# Patient Record
Sex: Male | Born: 1960
Health system: Southern US, Community
[De-identification: ages and names within clinical notes are randomized; demographics above are authoritative.]

## PROBLEM LIST (undated history)

## (undated) DIAGNOSIS — N189 Chronic kidney disease, unspecified: Secondary | ICD-10-CM

## (undated) DIAGNOSIS — E785 Hyperlipidemia, unspecified: Secondary | ICD-10-CM

## (undated) DIAGNOSIS — I10 Essential (primary) hypertension: Secondary | ICD-10-CM

## (undated) DIAGNOSIS — Z8719 Personal history of other diseases of the digestive system: Secondary | ICD-10-CM

## (undated) DIAGNOSIS — C9210 Chronic myeloid leukemia, BCR/ABL-positive, not having achieved remission: Secondary | ICD-10-CM

## (undated) DIAGNOSIS — K649 Unspecified hemorrhoids: Secondary | ICD-10-CM

## (undated) DIAGNOSIS — G8929 Other chronic pain: Secondary | ICD-10-CM

## (undated) DIAGNOSIS — E119 Type 2 diabetes mellitus without complications: Secondary | ICD-10-CM

## (undated) DIAGNOSIS — K709 Alcoholic liver disease, unspecified: Secondary | ICD-10-CM

## (undated) DIAGNOSIS — M1990 Unspecified osteoarthritis, unspecified site: Secondary | ICD-10-CM

---

## 2005-12-19 ENCOUNTER — Ambulatory Visit: Payer: Self-pay | Admitting: Family Medicine

## 2010-05-11 ENCOUNTER — Ambulatory Visit: Payer: Self-pay | Admitting: Gastroenterology

## 2010-05-24 ENCOUNTER — Ambulatory Visit: Payer: Self-pay | Admitting: Gastroenterology

## 2010-06-07 ENCOUNTER — Ambulatory Visit: Payer: Self-pay | Admitting: Gastroenterology

## 2011-02-20 ENCOUNTER — Emergency Department: Payer: Self-pay | Admitting: Emergency Medicine

## 2011-12-09 ENCOUNTER — Inpatient Hospital Stay: Payer: Self-pay | Admitting: *Deleted

## 2011-12-09 ENCOUNTER — Ambulatory Visit: Payer: Self-pay | Admitting: Gastroenterology

## 2011-12-10 LAB — CBC WITH DIFFERENTIAL/PLATELET
Basophil #: 0 10*3/uL (ref 0.0–0.1)
Basophil %: 0.1 %
Eosinophil %: 0.5 %
HGB: 8.6 g/dL — ABNORMAL LOW (ref 13.0–18.0)
Lymphocyte #: 1.3 10*3/uL (ref 1.0–3.6)
Lymphocyte %: 14.3 %
MCH: 32.1 pg (ref 26.0–34.0)
MCV: 98 fL (ref 80–100)
Monocyte #: 0.7 10*3/uL (ref 0.0–0.7)
Neutrophil #: 7.2 10*3/uL — ABNORMAL HIGH (ref 1.4–6.5)
RBC: 2.68 10*6/uL — ABNORMAL LOW (ref 4.40–5.90)
RDW: 13.6 % (ref 11.5–14.5)

## 2011-12-10 LAB — BASIC METABOLIC PANEL
BUN: 10 mg/dL (ref 7–18)
Chloride: 109 mmol/L — ABNORMAL HIGH (ref 98–107)
EGFR (Non-African Amer.): >60
Glucose: 61 mg/dL — ABNORMAL LOW (ref 65–99)
Osmolality: 278 (ref 275–301)
Potassium: 3.6 mmol/L (ref 3.5–5.1)
Sodium: 141 mmol/L (ref 136–145)

## 2011-12-12 LAB — IRON AND TIBC: Iron: 27 ug/dL — ABNORMAL LOW (ref 65–175)

## 2011-12-13 LAB — MAGNESIUM: Magnesium: 1.2 mg/dL — ABNORMAL LOW

## 2011-12-13 LAB — PHOSPHORUS: Phosphorus: 1.9 mg/dL — ABNORMAL LOW (ref 2.5–4.9)

## 2011-12-13 LAB — STOOL CULTURE

## 2011-12-14 LAB — POTASSIUM: Potassium: 3.4 mmol/L — ABNORMAL LOW (ref 3.5–5.1)

## 2011-12-15 LAB — CULTURE, BLOOD (SINGLE)

## 2011-12-15 LAB — BASIC METABOLIC PANEL
Anion Gap: 7 (ref 7–16)
BUN: 1 mg/dL — ABNORMAL LOW (ref 7–18)
Calcium, Total: 7.3 mg/dL — ABNORMAL LOW (ref 8.5–10.1)
Co2: 28 mmol/L (ref 21–32)
EGFR (African American): >60
EGFR (Non-African Amer.): >60
Osmolality: 281 (ref 275–301)

## 2011-12-15 LAB — MAGNESIUM: Magnesium: 1.5 mg/dL — ABNORMAL LOW

## 2011-12-17 ENCOUNTER — Emergency Department: Payer: Self-pay | Admitting: Emergency Medicine

## 2011-12-19 ENCOUNTER — Ambulatory Visit: Payer: Self-pay | Admitting: Surgery

## 2011-12-19 LAB — HEPATIC FUNCTION PANEL A (ARMC)
Bilirubin, Direct: 0.2 mg/dL (ref 0.00–0.20)
Bilirubin,Total: 0.4 mg/dL (ref 0.2–1.0)
SGPT (ALT): 36 U/L

## 2011-12-19 LAB — CBC WITH DIFFERENTIAL/PLATELET
Basophil: 1 %
Eosinophil #: 0.1 10*3/uL (ref 0.0–0.7)
Eosinophil %: 0.7 %
Eosinophil: 1 %
HCT: 28.5 % — ABNORMAL LOW (ref 40.0–52.0)
Lymphocyte #: 1.8 10*3/uL (ref 1.0–3.6)
MCH: 33.1 pg (ref 26.0–34.0)
MCHC: 33.3 g/dL (ref 32.0–36.0)
MCV: 99 fL (ref 80–100)
Monocyte #: 0.8 10*3/uL — ABNORMAL HIGH (ref 0.0–0.7)
Monocyte %: 6.2 %
Neutrophil #: 9.4 10*3/uL — ABNORMAL HIGH (ref 1.4–6.5)
Platelet: 373 10*3/uL (ref 150–440)
RBC: 2.87 10*6/uL — ABNORMAL LOW (ref 4.40–5.90)
Segmented Neutrophils: 76 %
WBC: 12.3 10*3/uL — ABNORMAL HIGH (ref 3.8–10.6)

## 2012-01-13 ENCOUNTER — Ambulatory Visit: Payer: Self-pay | Admitting: Surgery

## 2012-03-18 ENCOUNTER — Ambulatory Visit: Payer: Self-pay | Admitting: Surgery

## 2014-09-13 ENCOUNTER — Inpatient Hospital Stay: Payer: Self-pay | Admitting: Specialist

## 2014-09-13 LAB — COMPREHENSIVE METABOLIC PANEL
ANION GAP: 11 (ref 7–16)
Albumin: 3.7 g/dL (ref 3.4–5.0)
Alkaline Phosphatase: 86 U/L
BUN: 41 mg/dL — AB (ref 7–18)
Bilirubin,Total: 0.2 mg/dL (ref 0.2–1.0)
CO2: 15 mmol/L — AB (ref 21–32)
Calcium, Total: 8.4 mg/dL — ABNORMAL LOW (ref 8.5–10.1)
Chloride: 111 mmol/L — ABNORMAL HIGH (ref 98–107)
Creatinine: 2.37 mg/dL — ABNORMAL HIGH (ref 0.60–1.30)
EGFR (African American): 37 — ABNORMAL LOW
GFR CALC NON AF AMER: 31 — AB
Glucose: 152 mg/dL — ABNORMAL HIGH (ref 65–99)
OSMOLALITY: 287 (ref 275–301)
Potassium: 4.4 mmol/L (ref 3.5–5.1)
SGOT(AST): 18 U/L (ref 15–37)
SGPT (ALT): 25 U/L
Sodium: 137 mmol/L (ref 136–145)
TOTAL PROTEIN: 7.5 g/dL (ref 6.4–8.2)

## 2014-09-13 LAB — CBC WITH DIFFERENTIAL/PLATELET
BASOS PCT: 0.5 %
Basophil #: 0 10*3/uL (ref 0.0–0.1)
Eosinophil #: 0.1 10*3/uL (ref 0.0–0.7)
Eosinophil %: 1.1 %
HCT: 42.2 % (ref 40.0–52.0)
HGB: 13.9 g/dL (ref 13.0–18.0)
LYMPHS ABS: 1.7 10*3/uL (ref 1.0–3.6)
Lymphocyte %: 16.6 %
MCH: 29.3 pg (ref 26.0–34.0)
MCHC: 33 g/dL (ref 32.0–36.0)
MCV: 89 fL (ref 80–100)
Monocyte #: 0.6 x10 3/mm (ref 0.2–1.0)
Monocyte %: 6.3 %
Neutrophil #: 7.7 10*3/uL — ABNORMAL HIGH (ref 1.4–6.5)
Neutrophil %: 75.5 %
Platelet: 214 10*3/uL (ref 150–440)
RBC: 4.74 10*6/uL (ref 4.40–5.90)
RDW: 14.8 % — ABNORMAL HIGH (ref 11.5–14.5)
WBC: 10.2 10*3/uL (ref 3.8–10.6)

## 2014-09-13 LAB — URINALYSIS, COMPLETE
Bacteria: NONE SEEN
Bilirubin,UR: NEGATIVE
Blood: NEGATIVE
Glucose,UR: NEGATIVE mg/dL (ref 0–75)
Hyaline Cast: 28
KETONE: NEGATIVE
Leukocyte Esterase: NEGATIVE
Nitrite: NEGATIVE
PROTEIN: NEGATIVE
Ph: 5 (ref 4.5–8.0)
RBC,UR: <1 /HPF (ref 0–5)
Specific Gravity: 1.018 (ref 1.003–1.030)
Squamous Epithelial: 1

## 2014-09-13 LAB — DRUG SCREEN, URINE
Amphetamines, Ur Screen: NEGATIVE (ref ?–1000)
Barbiturates, Ur Screen: NEGATIVE (ref ?–200)
Benzodiazepine, Ur Scrn: NEGATIVE (ref ?–200)
CANNABINOID 50 NG, UR ~~LOC~~: NEGATIVE (ref ?–50)
COCAINE METABOLITE, UR ~~LOC~~: POSITIVE (ref ?–300)
MDMA (ECSTASY) UR SCREEN: NEGATIVE (ref ?–500)
METHADONE, UR SCREEN: NEGATIVE (ref ?–300)
Opiate, Ur Screen: NEGATIVE (ref ?–300)
Phencyclidine (PCP) Ur S: NEGATIVE (ref ?–25)
TRICYCLIC, UR SCREEN: NEGATIVE (ref ?–1000)

## 2014-09-13 LAB — ETHANOL: Ethanol: <3 mg/dL (ref 0–80)

## 2014-09-13 LAB — PROTIME-INR
INR: 1
Prothrombin Time: 13.1 secs (ref 11.5–14.7)

## 2014-09-13 LAB — LIPASE, BLOOD

## 2014-09-13 LAB — MAGNESIUM: MAGNESIUM: 2.6 mg/dL — AB

## 2014-09-13 LAB — TROPONIN I: Troponin-I: <0.02 ng/mL

## 2014-09-14 LAB — CBC WITH DIFFERENTIAL/PLATELET
BASOS PCT: 0.6 %
Basophil #: 0.1 10*3/uL (ref 0.0–0.1)
EOS ABS: 0.2 10*3/uL (ref 0.0–0.7)
Eosinophil %: 2.5 %
HCT: 39.4 % — AB (ref 40.0–52.0)
HGB: 12.7 g/dL — ABNORMAL LOW (ref 13.0–18.0)
LYMPHS ABS: 2.8 10*3/uL (ref 1.0–3.6)
Lymphocyte %: 29 %
MCH: 29.1 pg (ref 26.0–34.0)
MCHC: 32.2 g/dL (ref 32.0–36.0)
MCV: 91 fL (ref 80–100)
Monocyte #: 0.9 x10 3/mm (ref 0.2–1.0)
Monocyte %: 9.9 %
NEUTROS PCT: 58 %
Neutrophil #: 5.5 10*3/uL (ref 1.4–6.5)
Platelet: 200 10*3/uL (ref 150–440)
RBC: 4.35 10*6/uL — ABNORMAL LOW (ref 4.40–5.90)
RDW: 14.8 % — AB (ref 11.5–14.5)
WBC: 9.5 10*3/uL (ref 3.8–10.6)

## 2014-09-14 LAB — BASIC METABOLIC PANEL
Anion Gap: 8 (ref 7–16)
BUN: 25 mg/dL — ABNORMAL HIGH (ref 7–18)
CALCIUM: 8.1 mg/dL — AB (ref 8.5–10.1)
Chloride: 113 mmol/L — ABNORMAL HIGH (ref 98–107)
Co2: 19 mmol/L — ABNORMAL LOW (ref 21–32)
Creatinine: 1.27 mg/dL (ref 0.60–1.30)
EGFR (African American): >60
EGFR (Non-African Amer.): >60
Glucose: 79 mg/dL (ref 65–99)
Osmolality: 283 (ref 275–301)
POTASSIUM: 4.3 mmol/L (ref 3.5–5.1)
Sodium: 140 mmol/L (ref 136–145)

## 2014-09-14 LAB — MAGNESIUM: Magnesium: 2.3 mg/dL

## 2014-09-15 LAB — BASIC METABOLIC PANEL
Anion Gap: 8 (ref 7–16)
BUN: 13 mg/dL (ref 7–18)
CALCIUM: 8.4 mg/dL — AB (ref 8.5–10.1)
Chloride: 112 mmol/L — ABNORMAL HIGH (ref 98–107)
Co2: 22 mmol/L (ref 21–32)
Creatinine: 1.09 mg/dL (ref 0.60–1.30)
EGFR (African American): >60
EGFR (Non-African Amer.): >60
GLUCOSE: 110 mg/dL — AB (ref 65–99)
OSMOLALITY: 284 (ref 275–301)
Potassium: 4 mmol/L (ref 3.5–5.1)
SODIUM: 142 mmol/L (ref 136–145)

## 2014-09-15 LAB — CLOSTRIDIUM DIFFICILE(ARMC)

## 2014-09-17 LAB — WBCS, STOOL

## 2014-09-18 LAB — STOOL CULTURE

## 2014-12-04 DIAGNOSIS — M5136 Other intervertebral disc degeneration, lumbar region: Secondary | ICD-10-CM | POA: Insufficient documentation

## 2014-12-13 ENCOUNTER — Ambulatory Visit: Payer: Self-pay | Admitting: Physical Medicine and Rehabilitation

## 2015-03-18 NOTE — Discharge Summary (Signed)
   PATIENT NAME:  Frank [name], Frank [name] MR#:  [id number] DATE OF BIRTH:  09-Oct-1961  DATE OF ADMISSION:  09/13/2014 DATE OF DISCHARGE:  09/15/2014  For a detailed note, please see the history and physical done on admission by Dr. Bridgett Larsson.   DIAGNOSES AT DISCHARGE: As follows:  1.  Acute colitis/enteritis.  2.  Diarrhea secondary to the colitis/enteritis.  3.  Acute renal failure.  4.  Hypertension.  5.  Chronic pancreatitis.   The patient is being discharged on a low-sodium, carbohydrate-controlled diet.   ACTIVITY: As tolerated.   FOLLOWUP:  With Dr. Lovie Macadamia in the next 1-2 weeks.   DISCHARGE MEDICATIONS: Omeprazole 20 mg daily, pancreatic lipase 3 caps t.i.d., Percocet 5/325, 1 tablet q.6 h. as needed for pain, gabapentin 100 mg t.i.d.. aspirin 81 mg daily, NovoLog insulin pump as directed, HCTZ/lisinopril 12.5/20, 2 tabs daily, ciprofloxacin 250 mg b.i.d. x7 days, and Flagyl 5 mg q.8 h. x7 days.  PERTINENT STUDIES DONE DURING THE HOSPITAL COURSE: A CT scan of the abdomen and pelvis done without contrast, showing frothy fluid in the distal colon which could represent diarrheal process or distal colitis. Small air/fluid levels in nondilated loops of jejunum can be seen in proximal enteritis. Chronic calcific pancreatitis.   HOSPITAL COURSE: This is a 54 year old male with medical problems as mentioned above, presented to the hospital with abdominal pain and diarrhea and CT findings suggestive of colitis/enteritis.   1.  Colitis/enteritis. This was likely the cause of the patient's abdominal pain and diarrhea. CT findings were suggestive of that. The patient was empirically started on ciprofloxacin and some Flagyl, placed on a clear liquid diet. The patient's stool for Clostridium difficile was negative and also comprehensive culture, which is still pending. After getting supportive care with IV fluids, antiemetics and empiric antibiotics, the patient's clinical symptoms have improved. His diarrhea  has resolved. He is currently tolerating a regular diet without worsening abdominal pain or diarrhea and therefore being discharged home.  2.  Acute renal failure. This was likely acute renal failure from hypotension and acute tubular necrosis. The patient was hydrated with IV fluids, and creatinine has come back to baseline. The patient's ACE inhibitor and HCTZ were held due to the renal failure, but that can be resumed upon discharge.  3.  Diabetes. The patient will continue his insulin pump as directed.  4.  Diabetic neuropathy. The patient was maintained on his Neurontin. He will resume that.  5.  Gastroesophageal reflux disease. The patient was maintained on his Protonix. He will resume that upon discharge.   CODE STATUS: The patient is a full code.   TIME SPENT: 40 minutes    ____________________________ Belia Heman. Verdell Carmine, MD vjs:je D: 09/15/2014 16:24:07 ET T: 09/16/2014 08:51:30 ET JOB#: [id number]  cc: Belia Heman. Verdell Carmine, MD, <Dictator> Youlanda Roys. Lovie Macadamia, MD Henreitta Leber MD ELECTRONICALLY SIGNED 10/03/2014 11:03

## 2015-03-18 NOTE — H&P (Signed)
PATIENT NAME:  Frank [name], GUITRON MR#:  [id number] DATE OF BIRTH:  1961-01-29  DATE OF ADMISSION:  09/13/2014   PRIMARY CARE PHYSICIAN:  Juluis Pitch, MD    REFERRING PHYSICIAN:  Hinda Kehr, MD  CHIEF COMPLAINT: Abdominal pain today.   HISTORY OF PRESENT ILLNESS:  A 54 year old American male with a history of hypertension, diabetes, chronic pancreatitis who presented to the ED with abdominal pain today. The patient is alert, awake, oriented, in no acute distress. The patient said he was fine until this morning.  The patient started to have abdominal pain in the middle part of the abdomen, which is aching, about 5 to 6/10 without radiation.  The patient denies any nausea or vomiting. No diarrhea. No melena or bloody stool. The patient was noted to have acute increased BUN and creatinine.  In addition, the patient's blood pressure was low at 80s, was treated with normal saline bolus and the patient denies any other symptoms.   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   PAST SURGICAL HISTORY:  Left knee arthroscopy.   FAMILY HISTORY: Hypertension, diabetes, but denies any cancer or heart attack or stroke.   SOCIAL HISTORY: Smokes 1 pack a day about 34 years. Denies any alcohol drinking or illicit drugs.   ALLERGIES: None.   HOME MEDICATIONS: Aspirin 81 mg p.o. daily, gabapentin 100 mg p.o.  t.i.d., hydrochlorothiazide lisinopril 12.5 mg/20 mg p.o. tablets, 2 tablets once a day, insulin pump daily, omeprazole 20 mg p.o. daily, pancrelipase 6000 units-19,000 units-30,000 units p.o. capsules, 3 capsules 3 times a day with meals, Percocet 5/325 mg p.o. tablets every 6 hours p.r.n.    REVIEW OF SYSTEMS:  CONSTITUTIONAL: The patient denies any fever or chills. No headache or dizziness, but has weakness.  EYES: No double vision or blurred vision.  ENT: No postnasal drip, slurred speech or dysphagia.   CARDIOVASCULAR: No chest pain, palpitation, orthopnea, nocturnal dyspnea. No leg edema.  PULMONARY: No cough, sputum, shortness of breath or hemoptysis.  GASTROINTESTINAL: Positive for abdominal pain, but no nausea, vomiting, or diarrhea. No melena or bloody stool.  GENITOURINARY: No dysuria, hematuria, or incontinence.  SKIN: No rash or jaundice.  NEUROLOGIC: No syncope, loss of consciousness, or seizure.  ENDOCRINE: No polyuria, polydipsia, heat or cold tolerance.  HEMATOLOGY: No easy bruising or bleeding.   PHYSICAL EXAMINATION:  VITAL SIGNS: Temperature 97.5, blood pressure was 83/51 now 98/68, pulse 75, oxygen saturation 100% on room air.  GENERAL: The patient is alert, awake, oriented, in no acute distress.  HEENT: Pupils round, equal and reactive to light and accommodation. Moist oral mucosa. Clear oropharynx.  NECK: Supple. No JVD or carotid bruit. No lymphadenopathy. No thyromegaly.  CARDIOVASCULAR: S1, S2 regular rate and rhythm. No murmurs or gallops.  PULMONARY: Bilateral air entry. No wheezing or rales. No use of accessory muscle to breathe.  ABDOMEN: Soft.  No distention, but has tenderness in the middle part of the abdomen. No rigidity. No rebound. Bowel sounds present, unable to estimate whether the patient has organomegaly due to abdominal tenderness.  EXTREMITIES: No edema, clubbing or cyanosis. No calf tenderness. Bilateral pedal pulses present.  SKIN: No rash or jaundice.  NEUROLOGY: A and o x 3. No focal deficit. Power 5/5. Sensation intact.   LABORATORY DATA: Glucose 152, BUN 41, creatinine 2.37, sodium 137, potassium 4.4, chloride 111, bicarbonate 15, magnesium 2.6, SGOT 18, SGPT 25, alkaline phosphatase 86, CBC in normal range.  Abdominal and  pelvis CAT scan showed possible distal colitis.  There is still some small air-fluid levels in the non-dilated loops of the jejunum which can be seen in the proximal enteritis.  Chronic calcified pancreatitis.  Abdominal  ultrasound unremarkable peritoneal ultrasound.  EKG showed normal sinus rhythm at 70 BPM.   IMPRESSIONS:  1.  Hypotension, unclear etiology, possibly due to hypertension medication.  2.  Acute renal failure.  3.  Possible acute colitis. 4.  Diabetes.  5.  History of hypertension,  6. Tobacco abuse.  PLAN OF TREATMENT:  1.  The patient will be admitted to medical floor for colitis.  We will start Cipro and Flagyl and start clear liquids diet and follow up CBC.   2.  Pain control with Zofran p.r.n.  3.  For acute renal failure and hypotension, we will hold hydrochlorothiazide-lisinopril and start and IV fluid normal saline and follow up BMP.  The patient was treated with normal saline bolus in the Emergency Department, blood pressure is better.  4.  Diabetes, continue the patient's insulin pump.  5. Smoking cessation was counselled for 4 min.  I discussed the patient's condition and plan of treatment with the patient. The patient wants full code at this time.    TIME SPENT:  About 62 minutes.     ____________________________ Demetrios Loll, MD qc:DT D: 09/13/2014 16:32:00 ET T: 09/13/2014 17:07:35 ET JOB#: [id number]  cc: Demetrios Loll, MD, <Dictator> Demetrios Loll MD ELECTRONICALLY SIGNED 09/13/2014 22:09

## 2015-03-19 NOTE — Consult Note (Signed)
Chief Complaint:   Subjective/Chief Complaint Complaint of abdominal pain generalized with more localized pain to left lower quadrant.  Nausea last pm but resolved this morning.  No vomiting.  Three bowel movements since admission to his room.  Diarrhea-like in consistency.  No evidence of rectal bleeding.  Does feel better.  Rested well last pm.  Still evidence of edema bilaterally to lower extremities but patient feels it is getting better.  Compression hose in place.  Complaint of abdominal distention. Tolerating clear liquid diet well.   VITAL SIGNS/ANCILLARY NOTES: **Vital Signs.:   15-Jan-13 00:26   Vital Signs Type POCT   Nurse Fingerstick (mg/dL) FSBS (fasting range 65-99 mg/dL) 69   Comments/Interventions  Nurse Notified    01:28   Vital Signs Type Q 4hr   Temperature Temperature (F) 98.1   Celsius 36.7   Temperature Source oral   Pulse Pulse 86   Pulse source per Dinamap   Respirations Respirations 20   Systolic BP Systolic BP 981   Diastolic BP (mmHg) Diastolic BP (mmHg) 91   Mean BP 109   BP Source Dinamap   Pulse Ox % Pulse Ox % 99   Pulse Ox Activity Level  At rest   Oxygen Delivery Room Air/ 21 %    06:06   Vital Signs Type Routine   Temperature Temperature (F) 98   Celsius 36.6   Temperature Source oral   Pulse Pulse 84   Pulse source per Dinamap   Respirations Respirations 20   Systolic BP Systolic BP 191   Diastolic BP (mmHg) Diastolic BP (mmHg) 86   Mean BP 107   BP Source Dinamap   Pulse Ox % Pulse Ox % 95   Pulse Ox Activity Level  At rest   Oxygen Delivery Room Air/ 21 %    06:15   Vital Signs Type Routine   Temperature Temperature (F) 98   Celsius 36.6   Temperature Source oral   Pulse Pulse 98   Pulse source per Dinamap   Respirations Respirations 20   Systolic BP Systolic BP 478   Diastolic BP (mmHg) Diastolic BP (mmHg) 95   Mean BP 108   BP Source Dinamap   Pulse Ox % Pulse Ox % 100   Pulse Ox Activity Level  At rest   Oxygen Delivery  Room Air/ 21 %    07:44   Vital Signs Type POCT   Nurse Fingerstick (mg/dL) FSBS (fasting range 65-99 mg/dL) 81   Comments/Interventions  Nurse Notified   Brief Assessment:   Cardiac Regular  no murmur    Respiratory normal resp effort  no use of accessory muscles  Clear to ausculation throughout except a few scattered rhonchi to left lower lobe    Gastrointestinal details normal Distended evidence of ascites as well as mild anascarca.  Hypoactive bowel sounds.  Marked pain to left lower quadrant but pain noted throughout.  Evidence of peritoneal irritation.    Additional Physical Exam Alert and oriented.  Was up in room when arrived.  Ambulating without any difficulty.  Color pale.  Evidence of +1 edema bilaterally to entire lower extremity with edema noted bilaterally to hands and left forearm.  Mild improvement of edema from yesterday.  As stated above compression hoses in place BLE.  No rashes or jaundice.   Routine Hem:  15-Jan-13 06:50    WBC (CBC) 9.3   RBC (CBC) 2.68   Hemoglobin (CBC) 8.6   Hematocrit (CBC) 26.1   Platelet Count (  CBC) 223   MCV 98   MCH 32.1   MCHC 33.0   RDW 13.6   Neutrophil % 77.7   Lymphocyte % 14.3   Monocyte % 7.4   Eosinophil % 0.5   Basophil % 0.1   Neutrophil # 7.2   Lymphocyte # 1.3   Monocyte # 0.7   Eosinophil # 0.0   Basophil # 0.0   Assessment/Plan:  Assessment/Plan:   Assessment Admitted for the concern of abdominal pain with associated abnormal findings on CT scan of abdomen and pelvis with contrast revealing evidence of colitis to sigmoid colon as well as possible small bowel intersucception.  Admitted as well for pneumonia.  Known history of chronic pancreatitis alcohol related with findings of pseudocyst on CT scan.  Ascites.  Anemia secondary to probable chronic GI bleed.  Finding of heme positive stool on PE yesterday.    Plan 1.  Pending surgical consultation and evaluation.  Once recommendations are noted the decision will be  made about proceeding with endoscopic evaluation inclusive of diagnostic colonoscopy as well as EGD.   2.  Continue current antibiotic therapy as ordered.  3.  Will continue to monitor laboratory studies.  Recommend continued monitoring of H/H.  Transfuse as necessary. 4.  Continue with clear liquid diet.   5.  Will discuss findings and current laboratory studies with Dr. Candace Cruise.  If additional recommendations are suggested at this time will follow in addendum.   Electronic Signatures: Payton Emerald (NP)  (Signed 15-Jan-13 08:12)  Authored: Chief Complaint, VITAL SIGNS/ANCILLARY NOTES, Brief Assessment, Lab Results, Assessment/Plan   Last Updated: 15-Jan-13 08:12 by Payton Emerald (NP)

## 2015-03-19 NOTE — Consult Note (Signed)
After discussion with Dr. Verdie Shire.  Will proceed with colonic prep this afternoon.  To be started at 4 pm.  Patient to receive 4L of [place].  If patient should start to vomit prep will be held immediately.  Goal is proceed with diagnostic colonoscopy as well as EGD tomorrow.  Orders placed.  Colonic prep may help in resolution of small bowel findings.  Pending surgical evaluation.   Electronic Signatures: Payton Emerald (NP)  (Signed on 15-Jan-13 12:04)  Authored  Last Updated: 15-Jan-13 12:04 by Payton Emerald (NP)

## 2015-03-19 NOTE — H&P (Signed)
PATIENT NAME:  Frank [name], Frank [name] MR#:  [id number] DATE OF BIRTH:  Feb 12, 1961  DATE OF ADMISSION:  12/09/2011  PRIMARY CARE PHYSICIAN: Dr. Juluis Pitch  ENDOCRINOLOGIST: Dr. Kem Kays GASTROENTEROLOGIST: Dr. Candace Cruise.   REASON FOR ADMISSION: Patient was sent as a direct admit from Dr. Myrna Blazer office because of abnormal CT of the abdomen and pelvis which is suggestive of pneumonia, possible intussusception, questionable pancreatic pseudocyst and nonspecific colitis. Patient is complaining of abdominal pain, mild nausea, some cough.   HISTORY OF PRESENT ILLNESS: A 54 year old male who has history of diabetes, it was diagnosed in 2000. He follows with Dr. Sabra Heck. Also hypertension, hyperlipidemia, allergic rhinitis, he has chronic pancreatitis secondary to alcohol abuse and alcoholic liver disease, also has chronic back pain. He was sent to GI for evaluation of anemia. He was seen by Dr. Lovie Macadamia on 11/28/2011 , he got some blood work done. His hemoglobin at that time was 11.7 on 11/28/2011. His albumin was 2.1 with a bicarbonate of 20.8. His ESR was normal at 14 and his hemoglobin A1c was 6.5. He came to see Dr. Candace Cruise today and he had repeat blood work done and the repeat blood work which shows a decline of hemoglobin to 9.5, his albumin declined to 1.8 and his bicarbonate further declined to 18.6. He is complaining of chronic abdominal pain mainly in the left side of the abdomen. He is also complaining of some chronic diarrhea going on. He denies any change in his abdominal pain. He occasionally complains of nausea. He says that he sometimes has spotty blood in his stool any when he tries to strain. He denies any shortness of breath but complaining of some dyspnea on exertion. He is complaining of cough with dark brownish sputum. Denies any fever. He said he has very poor appetite and he has lost about 50 pounds in last 12 months. He is also complaining of lower extremity edema. So he had a CT abdomen and pelvis  done today from Dr. Myrna Blazer office which showed that the patient has possible intussusception in the left upper quadrant, some colitis, chronic pancreatitis with anasarca, questionable pseudocyst and opacities in the lung concerning for pneumonia so patient was sent here as a direct admit. He also had a chest x-ray done which was essentially negative. He is also complaining of feeling tired and fatigued.   REVIEW OF SYSTEMS: Positive for weakness. Positive for tiredness. He is also complaining of weight loss, poor appetite. No acute change in vision. No headache. No dizziness. He is complaining of cough with sputum production. No pleuritic chest pain. He is complaining of mild dyspnea on exertion. No palpitations or syncope. Nausea off and on. Chronic abdominal pain and diarrhea. He said that he had some spotting of blood whenever he strains. He denies any dysuria or frequency. He has no history of anemia. He is complaining of lower extremity swelling. No joint pains. No focal numbness or weakness or anxiety.   PAST MEDICAL HISTORY:  1. Diabetes mellitus. He follows with Dr. Kem Kays. His last hemoglobin A1c on 11/28/2011 was 6.5. His urine microalbumin was only 18. 2. Hypertension. 3. Hyperlipidemia. 4. Allergic rhinitis. 5. Chronic back pain.  6. 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   PAST SURGICAL HISTORY: Left knee arthroscopy.   HOME MEDICATIONS:  1. Percocet 5/325, it is prescribed q.4-6 hours as needed but he only takes twice a day as needed.  2. Naproxen 500 mg twice a day as needed. He only takes is  daily. 3. Metformin extended release 500 mg, two in the morning, one at lunchtime and one at supper. 4. Nasonex nasal spray both nostrils daily.  5. Omeprazole 20 mg daily. 6. Levemir he takes 5 units in the morning. 7. Flexeril 10 mg as needed.   SOCIAL HISTORY: He lives with his girlfriend. He said he quit drinking alcohol two years ago. A pack of  cigarettes lasts him about 3 to 4 days. Occasionally uses marijuana. Last used cocaine about one year ago.   FAMILY HISTORY: His mother is diabetic. He has family history of alcoholism also. No history of colon cancer or any GI neoplasm.   PHYSICAL EXAMINATION:  VITAL SIGNS: Temperature 97.4, heart rate 100, respiratory rate 20, , saturating 100% on room air.   GENERAL: This is a middle-aged [place] American male, thin built, wasted in appearance, comfortably lying in bed supine, no acute distress. He is comfortable on room air.   HEENT: Bilateral pupils are equal. Extraocular muscles are intact. Mild scleral icterus. No conjunctivitis. Oral mucosa is moist. There is pallor.   NECK: No thyroid tenderness, enlargement or nodules. Neck is supple. No masses, nontender. No adenopathy. No JVD. No carotid bruit.   CHEST: Bilateral breath sounds are clear. No wheeze. Normal effort. No respiratory distress.   HEART: Heart sounds are regular. No murmur. He has bilateral lower extremity edema.   ABDOMEN: Appears to be soft, very minimal tenderness in the left side of the abdomen. No guarding. No rigidity. No rebound. Normal bowel sounds. No hepatosplenomegaly. No bruit. No masses appreciable.   RECTAL: Deferred.   NEUROLOGIC: He is awake, alert, oriented to time, place, and person. Cranial nerves are intact. Moving all extremities against gravity.   EXTREMITIES: No cyanosis, no clubbing.   SKIN: He has significant bilateral lower extremity edema.   LABORATORY, DIAGNOSTIC AND RADIOLOGICAL DATA: His blood work today shows that he has a white count of 12.4, hemoglobin 9.5 and he has 79.4% neutrophils, platelet count 270,000. INR 1.3. Lipase less than 3, amylase is only 42. Sodium 138, potassium 4.3. His bicarbonate has gone down to 18.6. BUN 16, creatinine 1.2. His albumin is only 1.8. ALT 33, AST 35. His TIBC is low at 66.5, ferritin 315. His CT of the abdomen and pelvis as described before shows he  has small bowel intussusception of the left upper quadrant, bowel wall thickening of the sigmoid colon, questionable colitis, chronic pancreatitis and there is fluid in the greater curvature of the stomach, possibly pseudocyst and heterogenous opacities in lower lung concerning for infection or aspiration.   IMPRESSION:  1. Abdominal pain, suspect colitis, also rule out any small bowel intussusception. 2. Pneumonia. 3. Suspect alcoholic liver disease with severe hypoalbuminemia high INR and ascites.  4. Chronic pancreatitis with questionable pancreatic pseudocyst. 5. Anemia of chronic disease with low TIBC and high ferritin. 6. Diabetes mellitus, controlled. 7. Hypertension. 8. Hyperlipidemia. 9. Chronic back pain.   PLAN: A 54 year old male with history of diabetes, hypertension, hyperlipidemia, chronic back pain, chronic pancreatitis and also history of liver disease secondary to alcoholism. He was recently sent to Dr. Myrna Blazer office because of anemia. His hemoglobin has dropped; it was 11.7 on 11/28/2011 and  9.5 today on 12/09/2011 in like nine days. His TIBC is low and ferritin is high; most likely anemia of chronic disease. His CT of the abdomen and pelvis is abnormal which shows questionable pneumonia in the lower lung fields though his chest x-ray is negative and also shows there is  a questionable intussusception versus colitis. The patient could have small bowel thickening just because of the anasarca and he has elevated white count of 12.4. I am going to start him on antibiotics which will cover both colitis and pneumonia. I am going to start him on Levaquin and also Flagyl. Will send stool studies on him also. Will get a surgery consult for intussusception which is questionable and also pancreatic pseudocyst. He is already on PPI which I am going to continue. GI plans to do a luminal evaluation on him because of his anemia and weight loss once his colitis and intussusception have improved. I am  going to hold his metformin because he had a contrast study today. Will continue his Levemir and sliding scale coverage for now. Patient takes NSAIDs every day, naproxen, and I am going to stop that right now because of his anemia. His chronic diarrhea could be related to his chronic pancreatitis, most likely requires pancreatic enzyme supplementation at the time of discharge. He had an EGD done in June 2012 which shows reflux esophagitis and he had a colonoscopy done twice which was poor prep.   TIME SPENT WITH ADMISSION AND COORDINATION: 55 minutes. I reviewed all the records from [facility], also discussed with Dr. Candace Cruise.  ____________________________ Mena Pauls, MD ag:cms D: 12/09/2011 18:17:57 ET T: 12/10/2011 06:11:05 ET JOB#: [id number]  cc: Mena Pauls, MD, <Dictator> Youlanda Roys. Lovie Macadamia, MD Mena Pauls MD ELECTRONICALLY SIGNED 01/15/2012 17:57

## 2015-03-19 NOTE — Consult Note (Signed)
Chief Complaint:   Subjective/Chief Complaint Accidentally pulled D tube during sleep. Had to be replaced. Tolerating TF well. Feels better. Less abd pain.   VITAL SIGNS/ANCILLARY NOTES: **Vital Signs.:   18-Jan-13 11:29   Vital Signs Type POCT   Nurse Fingerstick (mg/dL) FSBS (fasting range 65-99 mg/dL) 218   Comments/Interventions  Nurse Notified   Brief Assessment:   Cardiac Regular    Respiratory clear BS    Gastrointestinal min tenderness   Routine Chem:  18-Jan-13 07:12    Magnesium, Serum 1.2   Phosphorus, Serum 1.9   Radiology Results: XRay:    18-Jan-13 04:04, Abdomen Flat and Erect   Abdomen Flat and Erect    REASON FOR EXAM:    to verify dobhoff placement  COMMENTS:       PROCEDURE: DXR - DXR ABDOMEN 2 V FLAT AND ERECT  - Dec 13 2011  4:04AM     RESULT: Comparisons:  None    Findings:      Supine and upright views of the abdomen are provided.    There isa nonspecific bowel gas pattern. There is no bowel dilatation to   suggest obstruction. There are no air-fluid levels. There is no   pathologic calcification along the expected course of the ureters. There   is no evidence of pneumoperitoneum, portalvenous gas, or pneumatosis.  There is a Dobbhoff tube with the metallic tip likely within the stomach.    The osseous structures are unremarkable.    IMPRESSION:     There is a Dobbhoff tube with the metallic tip likely within the stomach.          Verified By: Jennette Banker, M.D., MD   Assessment/Plan:  Assessment/Plan:   Assessment Chronic pancreatitis. Ascites.    Plan Continue TF. Continue lasix/aldactone. [place] for patient to be discharged by tomorrow unless acute changes. Have patient f'/u with [place] in few wks. Will sign off. Thanks.   Electronic Signatures: Verdie Shire (MD)  (Signed 18-Jan-13 14:00)  Authored: Chief Complaint, VITAL SIGNS/ANCILLARY NOTES, Brief Assessment, Lab Results, Radiology Results, Assessment/Plan   Last Updated:  18-Jan-13 14:00 by Verdie Shire (MD)

## 2015-03-19 NOTE — Discharge Summary (Signed)
PATIENT NAME:  Frank [name], Frank [name] MR#:  [id number] DATE OF BIRTH:  September 20, 1961  DATE OF ADMISSION:  12/09/2011 DATE OF DISCHARGE:  12/15/2011  DISCHARGE DIAGNOSES:  1. Abdominal pain, multifactorial, secondary to chronic pancreatitis with pancreatic pseudocyst. 2. Mild intussusception. 3. Colitis. 4. Alcoholic liver disease with ascites.  5. Hypoalbuminemia. 6. High INR. 7. Pneumonia. 8. Anemia of chronic disease. 9. Diabetes mellitus. 10. Hypertension. 11. Hyperlipidemia. 12. Chronic back pain.   CONSULTANTS: 1. Verdie Shire, MD - Gastroenterology. 2. Sherri Rad, MD - Surgery.  PROCEDURES: Upper GI endoscopy and colonoscopy.  HOSPITAL COURSE: This is a 54 year old male who has chronic pancreatitis, also suspect alcoholic liver disease, diabetes, hypertension, and hyperlipidemia. He was sent as a direct admit from Dr. Myrna Blazer office. He was initially sent to gastroenterology evaluation of anemia. Anemia was found to be anemia of chronic disease. He was found to have low albumin of 2.1 and low bicarbonate. He had a CT of the abdomen and pelvis done as an outpatient which showed that the patient had some small bowel intussusception in the left upper quadrant, also showed some bowel thickening suggestive of colitis, nonspecific. He has chronic pancreatitis, some ascites, and anasarca, and he has fluid along the greater curvature suggestive of chronic pancreatitis. He also had opacities in the lower lungs suggestive of infection or aspiration. So the patient was started on Levaquin and Flagyl for colitis and pneumonia. His chest x-ray at the time of admission was essentially negative. His white count was normal at 9.3 and hemoglobin was 8.6. Creatinine was normal at 0.87. He was kept on a clear liquid diet. Gastroenterology and Surgery saw him during the hospital stay. Surgery said the patient needs a Dobbhoff feeding tube during EGD into the jejunum. They want him to be nutritionally optimized before he can  have any surgery. He needs enteral nutrition and treatment of fat malabsorption once protein malnutrition is corrected and then they would re-image him for his pancreatic pseudocyst. He may need a lateral pancreaticojejunostomy. So a Dobbhoff feeding tube was inserted and the patient is getting tube feeds via Dobbhoff along with his p.o. diet for nutritional optimization. He had an GI upper endoscopy and colonoscopy done on 12/11/2011. His upper GI endoscopy essentially was negative with normal esophagus, stomach, and duodenum, and his colonoscopy was normal. He had some friable colonic mucosa and some hemorrhoids noted. His iron studies showed that he had anemia of chronic disease and iron saturation of 82% with TIBC of 33. He was also found to be having hypomagnesemia, hypophosphatemia, and hypokalemia. So he was given aggressive IV magnesium sulfate during the hospital stay. I will discharge him on magnesium oxide. His magnesium at discharge is 1.5. He got another dose of 2 grams of magnesium sulfate before discharge. His potassium is better today at 3.8 and creatinine is stable at 0.73. Also we gave him Neutra-Phos for his hypophosphatemia. He also had some chronic diarrhea most likely secondary to chronic pancreatitis which improved after giving him some pancreatic enzymes. His stool for WBCs only showed 0 to 5 cells. Stool cultures remained negative. His blood cultures remained negative. He had an abdominal film done after the Dobbhoff was placed, with the Dobbhoff tube in the proximal jejunum and slightly distended loops of bowel. A repeat abdominal film on 12/13/2011  showed the Dobbhoff tube within the stomach, no air-fluid levels, and nonspecific bowel gas pattern. His diet initially was clear liquids and ti was advanced to a low-fat diet. He is tolerating a  low fat diet right now. He is tolerating his tube feeds right now. His abdominal pain is resolved and his diarrhea has resolved. His cough has  improved. I am going to stop his nonsteroidal anti-inflammatory because of risk of bleed. He had LFTs done as an outpatient at [facility] which showed an albumin of 1.8. His ALT and AST were normal at that time. His INR was slightly on the higher side at 1.3, so most likely he has alcoholic liver disease also. He has also been started on low dose Lasix and Aldactone because of his ascites and anasarca, which has improved.  DISCHARGE MEDICATIONS: Home Medications: 1. Percocet 5/325 mg every 6 hours p.r.n. for pain.  2. Metformin 1000 mg once daily and 500 mg twice daily before lunch and dinner. 3. Insulin Levemir 5 units once a day. 4. Omeprazole 20 mg daily. 5. Flexeril twice daily as needed.  New Medications:  1. Lasix 20 mg p.o. once daily. 2. Spironolactone 50 mg p.o. once daily with meals.  3. Magnesium oxide 400 mg p.o. twice a day for seven days. 4. Pancrelipase 600 mg delayed release one tablet twice a day with meals. 5. Neutra-Phos one packet p.o. twice a day with meals for three days.   NOTE: Do not take Naproxen and Motrin.   DIET: 1800 calorie low fat diet. Tube feeds via Dobbhoff. Vital  1.5 calcium RTH at 61 mL/hour, flush with water 15 mL/hr, keep head of bed at greater than 30 degrees while feeding, Please watch for any abdominal pain or cramping during Dobbhoff feeds.  CONDITION AT DISCHARGE: Comfortable. He is ambulating around. T-max is 98.4, heart rate 80, blood pressure 130/84 and saturating 100% on room air. Chest is clear. Abdomen is soft and nontender. He has some ascites. He has some lower extremity edema. His anasarca has improved.   DISCHARGE FOLLOWUP/INSTRUCTIONS: Followup with Dr. Verdie Shire in 2 weeks. Followup with Dr. Sherri Rad in 1 week. Followup with Dr. Juluis Pitch in 1 to 2 weeks. Followup BMP and magnesium level at Dr. Reuel Boom office. His albumin also needs to be monitored to see how his nutritional status is going. Home health RN has been arranged  for the patient.   TIME SPENT ON DISCHARGE: 50 minutes.  ____________________________ Mena Pauls, MD ag:slb D: 12/15/2011 12:33:16 ET T: 12/16/2011 11:14:39 ET JOB#: [id number]  cc: Mena Pauls, MD, <Dictator> Youlanda Roys. Lovie Macadamia, MD Jeannette How Marina Gravel, MD Lupita Dawn. Candace Cruise, MD Mena Pauls MD ELECTRONICALLY SIGNED 01/15/2012 17:59

## 2015-03-19 NOTE — Consult Note (Signed)
Chief Complaint:   Subjective/Chief Complaint Apparently had only one BM this AM. Abd distended.   VITAL SIGNS/ANCILLARY NOTES: **Vital Signs.:   16-Jan-13 10:40   Vital Signs Type Q 4hr   Temperature Temperature (F) 97.5   Celsius 36.3   Temperature Source oral   Pulse Pulse 100   Pulse source per Dinamap   Respirations Respirations 18   Systolic BP Systolic BP 032   Diastolic BP (mmHg) Diastolic BP (mmHg) 97   Mean BP 112   BP Source Dinamap   Pulse Ox % Pulse Ox % 100   Pulse Ox Activity Level  At rest   Oxygen Delivery Room Air/ 21 %   Brief Assessment:   Cardiac Regular    Respiratory clear BS    Gastrointestinal distended with high pitched bowel sounds.   Routine Hem:  15-Jan-13 06:50    WBC (CBC) 9.3   RBC (CBC) 2.68   Hemoglobin (CBC) 8.6   Hematocrit (CBC) 26.1   Platelet Count (CBC) 223   MCV 98   MCH 32.1   MCHC 33.0   RDW 13.6   Neutrophil % 77.7   Lymphocyte % 14.3   Monocyte % 7.4   Eosinophil % 0.5   Basophil % 0.1   Neutrophil # 7.2   Lymphocyte # 1.3   Monocyte # 0.7   Eosinophil # 0.0   Basophil # 0.0  Routine Chem:  15-Jan-13 06:50    Glucose, Serum 61   BUN 10   Creatinine (comp) 0.87   Sodium, Serum 141   Potassium, Serum 3.6   Chloride, Serum 109   CO2, Serum 23   Calcium (Total), Serum 7.5   Anion Gap 9   Osmolality (calc) 278   eGFR (African American) >60   eGFR (Non-African American) >60   Assessment/Plan:  Assessment/Plan:   Assessment Anasarca. Chronic pancreatitis. Anemia. Only one BM with bowel prep.    Plan EGD with Dubhuff placement. Not sure if he is adequately prepped. If not, may need to repeat colonoscopy tomorrow with more bowel prep if no signs of bowel obstruction. Will recheck abd series today.   Electronic Signatures: Verdie Shire (MD)  (Signed 16-Jan-13 12:31)  Authored: Chief Complaint, VITAL SIGNS/ANCILLARY NOTES, Brief Assessment, Lab Results, Assessment/Plan   Last Updated: 16-Jan-13 12:31 by Verdie Shire (MD)

## 2015-03-19 NOTE — Consult Note (Signed)
Please see [name]'s notes from office. Referred by Dr. Lovie Macadamia for evaluation of anemia. Dropping in hgb over 2 wks, 50lb wt loss, development of anarsarca, decreased appetite, etc. CT done this afternoon show prob pneumonia, anarsarca, chronic pancreatitis with panc pseudocyst, colitis, and SB intussuception. Will admit patient. Have surgery evaluate patient. Colonoscopy and EGD if and when intussuception resolves. Thanks.  Electronic Signatures: Verdie Shire (MD)  (Signed on 14-Jan-13 16:31)  Authored  Last Updated: 14-Jan-13 16:31 by Verdie Shire (MD)

## 2015-03-19 NOTE — Consult Note (Signed)
PATIENT NAME:  Frank [name], Frank [name] MR#:  [id number] DATE OF BIRTH:  1961/07/29  DATE OF CONSULTATION:  12/10/2011  REFERRING PHYSICIAN:   CONSULTING PHYSICIAN:  Briant Angelillo A. Marina Gravel, MD  REASON FOR CONSULTATION: Evaluation of chronic pancreatitis, pancreatic pseudocyst, and possible small bowel intussusception.   HISTORY: This is a 54 year old white male patient of Dr. Jae Dire as well as Dr. Juluis Pitch of the [facility] who was seen recently in the GI Medicine office for evaluation of some chronic abdominal pain and anemia. He was found to have a hemoglobin in the office of 9.5. A CAT scan was obtained due to some chronic abdominal pain at the time which demonstrated pancreatic calcifications, pseudocyst, and possible intussusception. For this reason, the patient was admitted to the medical service and surgical services were consulted.   The patient states that he has had long-standing history of chronic alcohol abuse and dependence. He has been hospitalized several times in the past with alcoholic pancreatitis. He states that he has been sober now for some time. He has had a 50 pound weight loss within the last year. He also complains of early satiety and chronic abdominal pain made worse with eating, mainly in the left upper quadrant, with abdominal bloating at the same time with pain radiating into his left back. He states that he has chronic daily diarrhea. This is made worse with fatty foods. He is also a type 1 diabetic. He had been complaining also of some cough and congestion. CAT scan findings were also suggestive of pneumonia. He was scheduled for an upper and lower endoscopy, but because of the changes seen on CT scan the patient was admitted to the medical service and surgical services were consulted. The patient continues to work. He has been very fatigued, very weak, and complaining of very poor appetite and chronic pain.   ALLERGIES: No known drug allergies.   HOME MEDICATIONS:   1. Flexeril. 2. Insulin pen. 3. Metformin. 4. Motrin. 5. Naprosyn. 6. Omeprazole. 7. Percocet, which he states he takes anywhere between 4 to 6 per day, based on how much pain he is having.   PAST MEDICAL HISTORY:  1. Chronic alcoholism, currently in remission, according to the patient.  2. Type 1 diabetes. Last Hemoglobin A1c was 6.5 approximately one week ago. 3. History of hypertension.  4. History of hyperlipidemia.  5. History of allergic rhinitis. 6. History of chronic back pain.  7. History of alcoholic liver disease.   PAST SURGICAL HISTORY: Left knee arthroscopy.  REVIEW OF SYSTEMS: As described above. In addition, the patient had poor p.o. intake, anorexia, and weight loss. No jaundice. No fevers. Chronic diarrhea. Chronic abdominal pain. Abdominal swelling. He does admit to having multiple bowel movements today as well as passage of gas, but no vomiting today.   SOCIAL HISTORY: History of alcohol use. He lives with his girlfriend. A pack of cigarettes lasts him 3 to 4 days. He has occasional mass marijuana use. He admits to cocaine one year ago.   FAMILY HISTORY:  History is significant for diabetes and alcoholism.   PHYSICAL EXAMINATION:   GENERAL: The patient is a very ill-appearing black male sitting in bed with multiple family members at the bedside, in no obvious distress.   VITAL SIGNS: Temperature is 98.5, pulse 91, respiratory rate 18, and blood pressure 132/92.  HEENT: Facies are symmetrical. Extraocular muscles are intact. Sclerae are without icterus. There is temporal wasting. No adenopathy within the neck.   LUNGS: Clear bilaterally.  HEART: Regular rate and rhythm.   ABDOMEN: Moderately distended and tender throughout especially in the epigastrium and left upper quadrant. There are no hernias, no scars, no obvious ascites, and no caput medusae.   RECTAL: Examination was deferred.   NEURO/PSYCHIATRIC: Examination is normal. Thyroid is normal.    EXTREMITIES: No obvious clubbing or cyanosis. Lower extremities demonstrate significant lower extremity edema.   LABS/STUDIES: Albumin was 1.8, glucose 61, BUN 10, creatinine 0.87, chloride 109, and total calcium 7.9. White count is 9.3, hemoglobin 8.6, hematocrit 26.1, and platelet count 223,000. Pro time is 14.2.   I do not have access to the anemia panel.   Review of CT scan: I performed this personally with Dr. Jeneen Rinks and also independently. There is diffuse fatty infiltration of the liver consistent with hepatic steatosis. Spleen, adrenal and gallbladder are unremarkable. There is marked pancreatic ductal dilatation with pancreatic stones, mainly in the tail. There is a 7.5 x 4 cm pancreatic pseudocyst which exerts a mass affect on the stomach. There is one area of small bowel to small bowel intussusception. There does not appear to be any evidence of bowel obstruction. There is mild perihepatic fluid and small amount of ascites. There is diffuse anasarca.   IMPRESSION: This is a 54 year old male with chronic calcific alcohol-induced pancreatitis, chronic pain, malnourishment, diabetes, malnutrition, and malabsorption. He has a large pancreatic duct filled with stones and a pancreatic pseudocyst of unclear duration. I do not believe that the patient has significant intussusception and that this likely represents an incidental radiographic finding. There are no signs of active bowel obstruction. The patient has significant anemia which I think is from anemia of chronic disease but certainly could be related to GI blood loss, either due to tumor of the colon or peptic ulcer disease.   RECOMMENDATIONS: I agree with luminal evaluation to rule out GI source of anemia. I would proceed with attempt at placement of a small bore nasojejunal feeding tube at the same time to initiate enteral nutrition. I would strongly recommend pancreatic enzyme supplementation. The patient is a suitable candidate in my  opinion for surgery once his nutritional status can be repleted. His anemia needs further work-up. I suspect that it will be anemia of chronic disease. The patient would be of benefit for a lateral pancreaticojejunostomy with drainage of the pancreatic duct, removal of the stones and possible cystojejunostomy based on whether the pseudocyst resolves with time or not. I would repeat his CT scan in several weeks following nutritional repletion prior to any surgical intervention. I will also have my associate, Dr. Leanora Cover, evaluate the CT scan and render his opinion as well.   TOTAL TIME SPENT: 60 minutes. ____________________________ Jeannette How Marina Gravel, MD mab:slb D: 12/11/2011 17:17:00 ET T: 12/12/2011 09:42:41 ET JOB#: [id number]  cc: Elta Guadeloupe A. Marina Gravel, MD, <Dictator> Hortencia Conradi MD ELECTRONICALLY SIGNED 12/13/2011 17:40

## 2015-03-19 NOTE — Consult Note (Signed)
Chief Complaint:   Subjective/Chief Complaint Feels much better. X-ray showed D tube in correct position. No signs of obstruction.   VITAL SIGNS/ANCILLARY NOTES: **Vital Signs.:   17-Jan-13 07:25   Vital Signs Type POCT   Nurse Fingerstick (mg/dL) FSBS (fasting range 65-99 mg/dL) 128   Comments/Interventions  Pre-Meal   Brief Assessment:   Cardiac Regular    Respiratory clear BS    Gastrointestinal abd much less distended. decreased bowel sounds but no high pitched bowel sounds today.   Routine Chem:  17-Jan-13 05:07    Iron Binding Capacity (TIBC) 33   Radiology Results: XRay:    16-Jan-13 14:28, Abdomen Flat and Erect   Abdomen Flat and Erect    REASON FOR EXAM:    Dubhoff placement and check for bowel obstruction  COMMENTS:       PROCEDURE: DXR - DXR ABDOMEN 2 V FLAT AND ERECT  - Dec 11 2011  2:28PM     RESULT: Tube projects through the esophagus through the stomach through   the duodenum into the proximal jejunum in the left upper quadrant region   well past the ligament of Treitz and appears in good position. There is   air scattered through loops of small bowel which May been placed through   the tube at the time of placement. There remains some air within   nondistended loops of colon. The stomach is not significantly distended.    IMPRESSION:  Dobbhoff tube is in the proximal jejunum  Or aspect of the abdomen. The are some slightly distended loops of small   bowel. Continued followup to evaluate for evolving obstruction versus     ileus is recommended.          Verified By: Sundra Aland, M.D., MD   Assessment/Plan:  Assessment/Plan:   Assessment Ascites. Chronic pancreatitis.    Plan Start tube feedings today. Daily weight. Have home health arranged so that patient can receive enteral tube feedings at home.   Electronic Signatures: Verdie Shire (MD)  (Signed 17-Jan-13 08:13)  Authored: Chief Complaint, VITAL SIGNS/ANCILLARY NOTES, Brief Assessment,  Lab Results, Radiology Results, Assessment/Plan   Last Updated: 17-Jan-13 08:13 by Verdie Shire (MD)

## 2015-03-19 NOTE — Consult Note (Signed)
Both EGD and colonoscopy were completely normal. Friable colon mucosa. No bleeding source. Hemorrhoids noted. Anemia of chronic disease? Check Fe levels in AM. Abd series today for Dubhoff placement. Check also for bowel obstruction since abd distended with high pitched bowel sounds. If x-ray ok, then can start tube feeds via Dubhoff. Thanks.  Electronic Signatures: Verdie Shire (MD)  (Signed on 16-Jan-13 13:39)  Authored  Last Updated: 16-Jan-13 13:39 by Verdie Shire (MD)

## 2015-03-19 NOTE — Consult Note (Signed)
PATIENT NAME:  Frank [name], Frank [name] MR#:  [id number] DATE OF BIRTH:  04-09-1961  DATE OF CONSULTATION:  12/09/2011  REFERRING PHYSICIAN:   CONSULTING PHYSICIAN:  Lupita Dawn. Candace Cruise, MD  REASON FOR REFERRAL:  1. Pneumonia.  2. Colitis.  3. Chronic pancreatitis.  4. Possible small bowel intussusception.   HISTORY OF PRESENT ILLNESS: The patient is a 54 year old black male with a known history of diabetes, hypertension, and hyperlipidemia who also has a history of chronic pancreatitis due to alcohol abuse. He was sent to the office today for evaluation of anemia. In Dr. Reuel Boom office on January 3rd, his hemoglobin was 11.7. When we repeated the labs today, his hemoglobin was down to 9.5. He is also complaining of chronic abdominal pain mainly in the left side of the abdomen. He also complained of increasing abdominal girth, leg edema, and edema of his hands as well. His albumin level was 2.1 on January 3rd and now is 1.8. He also complains of some nausea and chronic diarrhea. He also complained about at least 50 pound weight loss over the past year so. He also complains of some dyspnea on exertion and some coughing with a brownish sputum. He denies having any fevers or chills.   We decided to do a CT scan of the abdomen and pelvis today. Multiple findings were noted. The patient had evidence of possible pneumonia, colitis involving the sigmoid colon. There was also evidence of chronic pancreatitis with a large pancreatic pseudocyst. There also appeared to be an intussusception in the left upper quadrant area as well. There was also anasarca. Because of all these findings, we felt the patient should be admitted for further evaluation and management.   REVIEW OF SYSTEMS: Again, there was a 50 pound weight loss with decreased appetite. There was some weakness and fatigue but no fevers or chills. There was some productive coughing but no chest pain. There was some mild dyspnea on exertion. There was some nausea and  some blood per rectum when he is straining. Rest of symptoms have been mentioned.   PAST MEDICAL HISTORY:  1. Diabetes. 2. Hypertension. 3. Chronic back pain. 4. Chronic pancreatitis. 5. Alcoholic liver disease.   PAST SURGICAL HISTORY: Left knee surgery.   HOME MEDICATIONS:  1. Percocet. 2. Naproxen. 3. Metformin. 4. Omeprazole. 5. Insulin. 6. Flexeril.   SOCIAL HISTORY: He quit drinking about two years ago. He still smokes a pack every 3 to 4 days. Admitted to using marijuana.   FAMILY HISTORY: Notable for alcoholism and diabetes.   PHYSICAL EXAMINATION:   GENERAL: The patient is somewhat cachectic.   VITAL SIGNS: He was afebrile and vital signs were stable but his heart rate was a little elevated at 100.   HEENT: Normocephalic, atraumatic head. Pupils are equally reactive. Throat was clear.   NECK: Supple.   CARDIAC: Regular rhythm and rate. I did not appreciate any murmurs.   LUNGS: Clear bilaterally.   ABDOMEN: Distended. Some mild diffuse tenderness. There is no rebound or guarding. There is no evidence of ascites.   EXTREMITIES: Evidence of edema in both of his legs and even his hands.   LABORATORY, DIAGNOSTIC, AND RADIOLOGICAL DATA: Labs done at the office showed white count 12.4, hemoglobin 9.5 with 79% neutrophils. INR 1.3. Lipase less than 3. Amylase normal. Sodium 138, potassium 4.3, BUN 16, creatinine 1.2, albumin 1.8, ALT 33, AST 35, ferritin level 315. Again, the CT scan was noted as well.   ASSESSMENT AND PLAN: This is a patient with  chronic pancreatitis, evidence of pseudocyst. There appears to be some small bowel intussusception and colitis. He also may have pneumonia.   PLAN: Plan is to admit the patient. The patient will be placed on antibiotics. Surgery will be consulted to evaluate for the pancreatic pseudocyst and small bowel intussusception. If no surgery is contemplated, then will prep him tomorrow and plan on doing an upper endoscopy and  colonoscopy on Wednesday. His previous attempt at colonoscopy was somewhat limited because of poor prep and inability to reach the cecum. Will also need to improve his nutritional status as well because of his low albumin causing his anasarca.   ____________________________ Lupita Dawn. Candace Cruise, MD pyo:drc D: 12/12/2011 08:24:00 ET T: 12/12/2011 11:37:13 ET JOB#: [id number]  cc: Lupita Dawn. Candace Cruise, MD, <Dictator> Lupita Dawn Renaye Janicki MD ELECTRONICALLY SIGNED 12/12/2011 19:18

## 2020-11-29 ENCOUNTER — Other Ambulatory Visit: Payer: Self-pay | Admitting: Sports Medicine

## 2020-11-29 DIAGNOSIS — S32020A Wedge compression fracture of second lumbar vertebra, initial encounter for closed fracture: Secondary | ICD-10-CM

## 2020-11-29 DIAGNOSIS — M47816 Spondylosis without myelopathy or radiculopathy, lumbar region: Secondary | ICD-10-CM

## 2020-11-29 DIAGNOSIS — W19XXXA Unspecified fall, initial encounter: Secondary | ICD-10-CM

## 2020-12-07 ENCOUNTER — Ambulatory Visit
Admission: RE | Admit: 2020-12-07 | Discharge: 2020-12-07 | Disposition: A | Discharge status: Home / Self Care | Payer: Medicare Other | Source: Ambulatory Visit | Attending: Sports Medicine | Admitting: Sports Medicine

## 2020-12-07 ENCOUNTER — Other Ambulatory Visit: Payer: Self-pay

## 2020-12-07 DIAGNOSIS — W19XXXA Unspecified fall, initial encounter: Secondary | ICD-10-CM | POA: Diagnosis not present

## 2020-12-07 DIAGNOSIS — Y939 Activity, unspecified: Secondary | ICD-10-CM | POA: Diagnosis not present

## 2020-12-07 DIAGNOSIS — M47816 Spondylosis without myelopathy or radiculopathy, lumbar region: Secondary | ICD-10-CM | POA: Insufficient documentation

## 2020-12-07 DIAGNOSIS — Y929 Unspecified place or not applicable: Secondary | ICD-10-CM | POA: Diagnosis not present

## 2020-12-07 DIAGNOSIS — S32020A Wedge compression fracture of second lumbar vertebra, initial encounter for closed fracture: Secondary | ICD-10-CM | POA: Insufficient documentation

## 2021-01-21 ENCOUNTER — Encounter
Admission: EM | Disposition: A | Discharge status: Home / Self Care | Payer: Self-pay | Source: Home / Self Care | Attending: Family Medicine

## 2021-01-21 ENCOUNTER — Observation Stay
Admission: EM | Admit: 2021-01-21 | Discharge: 2021-01-23 | Disposition: A | Discharge status: Home / Self Care | Payer: Medicare HMO | Attending: Emergency Medicine | Admitting: Emergency Medicine

## 2021-01-21 ENCOUNTER — Encounter: Payer: Self-pay | Admitting: Emergency Medicine

## 2021-01-21 ENCOUNTER — Emergency Department: Payer: Medicare HMO

## 2021-01-21 DIAGNOSIS — E1122 Type 2 diabetes mellitus with diabetic chronic kidney disease: Secondary | ICD-10-CM | POA: Diagnosis not present

## 2021-01-21 DIAGNOSIS — R6521 Severe sepsis with septic shock: Secondary | ICD-10-CM

## 2021-01-21 DIAGNOSIS — N179 Acute kidney failure, unspecified: Secondary | ICD-10-CM | POA: Diagnosis not present

## 2021-01-21 DIAGNOSIS — Z7982 Long term (current) use of aspirin: Secondary | ICD-10-CM | POA: Diagnosis not present

## 2021-01-21 DIAGNOSIS — I129 Hypertensive chronic kidney disease with stage 1 through stage 4 chronic kidney disease, or unspecified chronic kidney disease: Secondary | ICD-10-CM | POA: Diagnosis not present

## 2021-01-21 DIAGNOSIS — D72829 Elevated white blood cell count, unspecified: Secondary | ICD-10-CM

## 2021-01-21 DIAGNOSIS — I959 Hypotension, unspecified: Secondary | ICD-10-CM | POA: Diagnosis not present

## 2021-01-21 DIAGNOSIS — Z20822 Contact with and (suspected) exposure to covid-19: Secondary | ICD-10-CM | POA: Diagnosis not present

## 2021-01-21 DIAGNOSIS — R55 Syncope and collapse: Secondary | ICD-10-CM | POA: Diagnosis present

## 2021-01-21 DIAGNOSIS — D72828 Other elevated white blood cell count: Secondary | ICD-10-CM

## 2021-01-21 DIAGNOSIS — R079 Chest pain, unspecified: Secondary | ICD-10-CM

## 2021-01-21 DIAGNOSIS — D720 Genetic anomalies of leukocytes: Secondary | ICD-10-CM | POA: Diagnosis not present

## 2021-01-21 DIAGNOSIS — N189 Chronic kidney disease, unspecified: Secondary | ICD-10-CM | POA: Diagnosis not present

## 2021-01-21 DIAGNOSIS — D729 Disorder of white blood cells, unspecified: Secondary | ICD-10-CM

## 2021-01-21 DIAGNOSIS — Z794 Long term (current) use of insulin: Secondary | ICD-10-CM | POA: Diagnosis not present

## 2021-01-21 DIAGNOSIS — A419 Sepsis, unspecified organism: Secondary | ICD-10-CM

## 2021-01-21 DIAGNOSIS — Z87891 Personal history of nicotine dependence: Secondary | ICD-10-CM | POA: Diagnosis not present

## 2021-01-21 DIAGNOSIS — R778 Other specified abnormalities of plasma proteins: Secondary | ICD-10-CM

## 2021-01-21 DIAGNOSIS — F1910 Other psychoactive substance abuse, uncomplicated: Secondary | ICD-10-CM

## 2021-01-21 DIAGNOSIS — Z79899 Other long term (current) drug therapy: Secondary | ICD-10-CM | POA: Diagnosis not present

## 2021-01-21 HISTORY — DX: Personal history of other diseases of the digestive system: Z87.19

## 2021-01-21 HISTORY — DX: Hyperlipidemia, unspecified: E78.5

## 2021-01-21 HISTORY — DX: Alcoholic liver disease, unspecified: K70.9

## 2021-01-21 HISTORY — DX: Type 2 diabetes mellitus without complications: E11.9

## 2021-01-21 HISTORY — DX: Essential (primary) hypertension: I10

## 2021-01-21 HISTORY — DX: Chronic kidney disease, unspecified: N18.9

## 2021-01-21 HISTORY — DX: Unspecified osteoarthritis, unspecified site: M19.90

## 2021-01-21 HISTORY — DX: Other chronic pain: G89.29

## 2021-01-21 HISTORY — DX: Unspecified hemorrhoids: K64.9

## 2021-01-21 LAB — COMPREHENSIVE METABOLIC PANEL
ALT: 20 U/L (ref 0–44)
AST: 32 U/L (ref 15–41)
Albumin: 4 g/dL (ref 3.5–5.0)
Alkaline Phosphatase: 66 U/L (ref 38–126)
Anion gap: 7 (ref 5–15)
BUN: 22 mg/dL — ABNORMAL HIGH (ref 6–20)
CO2: 21 mmol/L — ABNORMAL LOW (ref 22–32)
Calcium: 9.3 mg/dL (ref 8.9–10.3)
Chloride: 107 mmol/L (ref 98–111)
Creatinine, Ser: 1.89 mg/dL — ABNORMAL HIGH (ref 0.61–1.24)
GFR, Estimated: 40 mL/min — ABNORMAL LOW (ref >60)
Glucose, Bld: 186 mg/dL — ABNORMAL HIGH (ref 70–99)
Potassium: 5.1 mmol/L (ref 3.5–5.1)
Sodium: 135 mmol/L (ref 135–145)
Total Bilirubin: 0.7 mg/dL (ref 0.3–1.2)
Total Protein: 7.1 g/dL (ref 6.5–8.1)

## 2021-01-21 LAB — RESP PANEL BY RT-PCR (FLU A&B, COVID) ARPGX2
Influenza A by PCR: NEGATIVE
Influenza B by PCR: NEGATIVE
SARS Coronavirus 2 by RT PCR: NEGATIVE

## 2021-01-21 LAB — TROPONIN I (HIGH SENSITIVITY): Troponin I (High Sensitivity): 25 ng/L — ABNORMAL HIGH (ref <18)

## 2021-01-21 LAB — TYPE AND SCREEN

## 2021-01-21 LAB — CBG MONITORING, ED: Glucose-Capillary: 164 mg/dL — ABNORMAL HIGH (ref 70–99)

## 2021-01-21 SURGERY — CORONARY/GRAFT ACUTE MI REVASCULARIZATION
Anesthesia: Moderate Sedation

## 2021-01-21 MED ORDER — IOHEXOL 350 MG/ML SOLN
100.0000 mL | Freq: Once | INTRAVENOUS | Status: AC | PRN
  Administered 2021-01-21: 100 mL via INTRAVENOUS

## 2021-01-21 MED ORDER — SODIUM CHLORIDE 0.9 % IV BOLUS
1000.0000 mL | Freq: Once | INTRAVENOUS | Status: AC
  Administered 2021-01-21: 1000 mL via INTRAVENOUS

## 2021-01-21 NOTE — Consult Note (Signed)
Interventional cardiology Code STEMI called in the field EKG was nondiagnostic and borderline Patient denied any chest pain complains mostly of back pain similar to his usual back pain Patient was hypotensive blood pressure in the 27M and 18M systolic There is report of substance abuse including cocaine and history Patient is diabetic and smoker currently not having any chest pain or shortness of breath Family states that he felt hot and weak and had a near syncopal episode No previous cardiac history Exam was essentially benign Adequate pulses bilaterally EKG unchanged from previous EKG with diffuse elevation anteriorly with no reciprocal changes worrisome for early repolarization  Impression I do not believe this is a STEMI CT of the chest to me shows no dissection I recommend fluid hydration Follow-up with chemistries EKGs and troponins Reevaluate need for cardiac cath 12 to 24 hours Recommend further work-up for noncardiac causes for his presentation If presentation changes would be happy to take the patient to the Cath Lab for invasive evaluation Case discussed with the ER physician Dr. Karma Greaser

## 2021-01-21 NOTE — ED Notes (Signed)
Pt is at CT at this time with this RN

## 2021-01-21 NOTE — ED Provider Notes (Signed)
[facility] Emergency Department Provider Note  ____________________________________________   Event Date/Time   First MD Initiated Contact with Patient 01/21/21 2255     (approximate)  I have reviewed the triage vital signs and the nursing notes.   HISTORY  Chief Complaint Loss of Consciousness  Level 5 caveat:  history/ROS limited by acute/critical illness  HPI Frank [name] is a 60 y.o. male who presents by EMS as code STEMI.  He reportedly had a syncopal episode at home and EMS was called by an observer.  He reportedly did not hit his head and his loss of consciousness was brief.  However the patient is reporting midthoracic back pain and had an episode of nausea and vomiting.  His blood pressure was low at about 80/40 by EMS.  He is denying chest pain and shortness of breath but he had some ST segment elevation in leads V1, V2, and V3, so a code STEMI was called in route to the hospital.  Upon arrival the patient is alert and oriented but cool, pale, and diaphoretic.  He continues to report some back pain.  He denies abdominal pain and chest pain.  The onset of symptoms was acute and severe nothing in particular makes it feel better or worse.  Of note he admits to cocaine, marijuana, and alcohol use just prior to the incident tonight.         Past Medical History:  Diagnosis Date  . Arthritis   . Chronic low back pain with sciatica   . CKD (chronic kidney disease)    unknown stage  . Diabetes mellitus without complication (HCC)    insulin pump therapy  . Hemorrhoids   . History of pancreatitis   . Hyperlipidemia   . Hypertension   . 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 AKI (acute kidney injury) ([place]) 01/22/2021    History reviewed. No pertinent surgical history.  Prior to Admission medications   Not on File    Allergies Patient has no allergy information on record.  History  reviewed. No pertinent family history.  Social History Social History   Tobacco Use  . Smoking status: Former Research scientist (life sciences)  . Smokeless tobacco: Never Used  Substance Use Topics  . Alcohol use: Yes    Comment: hx of ETOH abuse  . Drug use: Yes    Types: Cocaine, Marijuana    Review of Systems Level 5 caveat:  history/ROS limited by acute/critical illness   ____________________________________________   PHYSICAL EXAM:  VITAL SIGNS: ED Triage Vitals  Enc Vitals Group     BP 01/21/21 2258 (!) 80/51     Pulse Rate 01/21/21 2255 72     Resp 01/21/21 2255 18     Temp 01/21/21 2257 98.2 F (36.8 C)     Temp src --      SpO2 01/21/21 2257 100 %     Weight 01/21/21 2257 89.1 kg (196 lb 6.9 oz)     Height --      Head Circumference --      Peak Flow --      Pain Score --      Pain Loc --      Pain Edu? --      Excl. in [place]? --     Constitutional: Alert and oriented.  Eyes: Conjunctivae are normal.  Head: Atraumatic. Nose: No congestion/rhinnorhea. Mouth/Throat: Patient is wearing a mask.  Neck: No stridor.  No meningeal signs.   Cardiovascular: Normal rate, regular rhythm.  Patient has peripheral circulation but is clammy to the touch. Respiratory: Normal respiratory effort.  No retractions. Gastrointestinal: Soft and nontender. No distention.  No obvious abdominal bruit. Musculoskeletal: No lower extremity tenderness nor edema. No gross deformities of extremities. Neurologic:  Normal speech and language. No gross focal neurologic deficits are appreciated.  Skin:  Skin is cool and mildly diaphoretic.   ____________________________________________   LABS (all labs ordered are listed, but only abnormal results are displayed)  Labs Reviewed  CBC WITH DIFFERENTIAL/PLATELET - Abnormal; Notable for the following components:      Result Value   WBC 97.1 (*)    RBC 4.21 (*)    Hemoglobin 12.6 (*)    RDW 15.6 (*)    nRBC 1.0 (*)    Neutro Abs 54.4 (*)    Lymphs Abs 9.7 (*)     Monocytes Absolute 3.9 (*)    Eosinophils Absolute 1.0 (*)    Abs Immature Granulocytes 21.40 (*)    All other components within normal limits  COMPREHENSIVE METABOLIC PANEL - Abnormal; Notable for the following components:   CO2 21 (*)    Glucose, Bld 186 (*)    BUN 22 (*)    Creatinine, Ser 1.89 (*)    GFR, Estimated 40 (*)    All other components within normal limits  URINALYSIS, ROUTINE W REFLEX MICROSCOPIC - Abnormal; Notable for the following components:   Color, Urine YELLOW (*)    APPearance CLEAR (*)    Specific Gravity, Urine 1.036 (*)    Glucose, UA 50 (*)    Protein, ur 30 (*)    All other components within normal limits  URINE DRUG SCREEN, QUALITATIVE ([facility] ONLY) - Abnormal; Notable for the following components:   Tricyclic, Ur Screen POSITIVE (*)    Cocaine Metabolite,Ur [place] POSITIVE (*)    Opiate, Ur Screen POSITIVE (*)    Cannabinoid 50 Ng, Ur [address] POSITIVE (*)    All other components within normal limits  LACTIC ACID, PLASMA - Abnormal; Notable for the following components:   Lactic Acid, Venous 2.3 (*)    All other components within normal limits  APTT - Abnormal; Notable for the following components:   aPTT <24 (*)    All other components within normal limits  CBG MONITORING, ED - Abnormal; Notable for the following components:   Glucose-Capillary 164 (*)    All other components within normal limits  TROPONIN I (HIGH SENSITIVITY) - Abnormal; Notable for the following components:   Troponin I (High Sensitivity) 25 (*)    All other components within normal limits  RESP PANEL BY RT-PCR (FLU A&B, COVID) ARPGX2  CULTURE, BLOOD (ROUTINE X 2)  CULTURE, BLOOD (ROUTINE X 2)  ETHANOL  PROTIME-INR  LACTIC ACID, PLASMA  PROCALCITONIN  HIV ANTIBODY (ROUTINE TESTING W REFLEX)  BASIC METABOLIC PANEL  CBC  TYPE AND SCREEN  TYPE AND SCREEN  TYPE AND SCREEN  TROPONIN I (HIGH SENSITIVITY)   ____________________________________________  EKG  ED ECG REPORT I,  Hinda Kehr, the attending physician, personally viewed and interpreted this ECG.  Date: 01/21/2021 EKG Time: 22: 54 Rate: 65 Rhythm: normal sinus rhythm QRS Axis: normal Intervals: normal ST/T Wave abnormalities: Minimal ST segment elevation in anterior leads but without reciprocal changes. Narrative Interpretation: no definitive evidence of acute ischemia; does not meet STEMI criteria.  Similar to prior EKG from 2015.   ED ECG REPORT #2 I,  Hinda Kehr, the attending physician, personally viewed and interpreted this ECG.  Date: 01/22/2021 EKG Time: 00: 41 Rate: 71 Rhythm: normal sinus rhythm QRS Axis: normal Intervals: normal ST/T Wave abnormalities: Unchanged slight ST segment elevation in leads V1, V2, and V3 without reciprocal changes.  Does not meet criteria for STEMI. Narrative Interpretation: no definitive evidence of acute ischemia; does not meet STEMI criteria.   ____________________________________________  RADIOLOGY I, Hinda Kehr, personally viewed and evaluated these images (plain radiographs) as part of my medical decision making, as well as reviewing the written report by the radiologist.  ED MD interpretation: No acute abnormalities identified on CTA chest/abdomen/pelvis.  Official radiology report(s): CT Angio Chest/Abd/Pel for Dissection W and/or Wo Contrast  Result Date: 01/22/2021 CLINICAL DATA:  Syncope, hypotension, thoracic back and abdominal pain. EXAM: CT ANGIOGRAPHY CHEST, ABDOMEN AND PELVIS TECHNIQUE: Non-contrast CT of the chest was initially obtained. Multidetector CT imaging through the chest, abdomen and pelvis was performed using the standard protocol during bolus administration of intravenous contrast. Multiplanar reconstructed images and MIPs were obtained and reviewed to evaluate the vascular anatomy. CONTRAST:  167mL OMNIPAQUE IOHEXOL 350 MG/ML SOLN COMPARISON:  05/11/2010 FINDINGS: CTA CHEST FINDINGS Cardiovascular: The thoracic aorta is  normal in course and caliber. No intramural hematoma, dissection, or aneurysm. Arch vasculature is widely patent proximally. No significant atherosclerotic plaque identified. Global cardiac size is within normal limits. Mild left ventricular hypertrophy. Minimal coronary artery calcification. No pericardial effusion. The central pulmonary arteries are of normal caliber. Mediastinum/Nodes: No enlarged mediastinal, hilar, or axillary lymph nodes. Thyroid gland, trachea, and esophagus demonstrate no significant findings. Lungs/Pleura: Mild dependent atelectasis. No superimposed focal pulmonary nodule or infiltrate. No pneumothorax or pleural effusion. Central airways are widely patent. Musculoskeletal: No acute bone abnormality. No lytic or blastic bone lesions. Review of the MIP images confirms the above findings. CTA ABDOMEN AND PELVIS FINDINGS VASCULAR Aorta: Mild mixed atherosclerotic plaque. No aneurysm or dissection. No perivascular inflammatory change. Celiac: Widely patent.  Normal anatomic configuration. SMA: Widely patent. Renals: Single renal arteries bilaterally. Widely patent. Normal vascular morphology. No aneurysm. IMA: Widely patent. Inflow: Moderate mixed atherosclerotic plaque without evidence of hemodynamically significant stenosis. No aneurysm or dissection. Internal iliac arteries are patent bilaterally. Veins: Unremarkable Review of the MIP images confirms the above findings. NON-VASCULAR Hepatobiliary: No focal liver abnormality is seen. No gallstones, gallbladder wall thickening, or biliary dilatation. Pancreas: Parenchymal calcifications are seen throughout the pancreatic parenchyma in keeping with changes of chronic pancreatitis. No peripancreatic inflammatory changes are identified. There is no pancreatic ductal dilation noted. Spleen: Unremarkable Adrenals/Urinary Tract: Adrenal glands are unremarkable. Kidneys are normal, without renal calculi, focal lesion, or hydronephrosis. Bladder is  unremarkable. Stomach/Bowel: Stomach is within normal limits. Appendix appears normal. No evidence of bowel wall thickening, distention, or inflammatory changes. No free intraperitoneal gas or fluid. Lymphatic: No pathologic adenopathy within the abdomen and pelvis. Reproductive: Prostate is unremarkable. Other: Small broad-based fat containing umbilical hernia. Musculoskeletal: No acute bone abnormality. No lytic or blastic bone lesions are identified within the abdomen and pelvis. Review of the MIP images confirms the above findings. IMPRESSION: No evidence of thoracoabdominal aortic aneurysm or dissection. Mild to moderate aortoiliac atherosclerotic calcification without evidence of hemodynamically significant stenosis. Minimal coronary artery calcification. Mild left ventricular hypertrophy. Chronic pancreatitis. No superimposed acute inflammatory changes identified. Aortic Atherosclerosis (ICD10-I70.0). Electronically Signed   By: Fidela Salisbury MD   On: 01/22/2021 00:21    ____________________________________________   PROCEDURES   Procedure(s) performed (including Critical Care):  Marland Kitchen  Critical Care Performed by: Hinda Kehr, MD Authorized by: Hinda Kehr, MD   Critical care provider statement:    Critical care time (minutes):  45   Critical care time was exclusive of:  Separately billable procedures and treating other patients   Critical care was necessary to treat or prevent imminent or life-threatening deterioration of the following conditions:  Cardiac failure   Critical care was time spent personally by me on the following activities:  Development of treatment plan with patient or surrogate, discussions with consultants, evaluation of patient's response to treatment, examination of patient, obtaining history from patient or surrogate, ordering and performing treatments and interventions, ordering and review of laboratory studies, ordering and review of radiographic studies, pulse  oximetry, re-evaluation of patient's condition and review of old charts .1-3 Lead EKG Interpretation Performed by: Hinda Kehr, MD Authorized by: Hinda Kehr, MD     Interpretation: normal     ECG rate:  65   ECG rate assessment: normal     Rhythm: sinus rhythm     Ectopy: none     Conduction: normal       ____________________________________________   INITIAL IMPRESSION / MDM / ASSESSMENT AND PLAN / ED COURSE  As part of my medical decision making, I reviewed the following data within the [place] notes reviewed and incorporated, Labs reviewed , EKG interpreted , Old EKG reviewed, Old chart reviewed, Discussed with admitting physician , A consult was requested and obtained from this/these consultant(s) Cardiology and Notes from prior ED visits   Differential diagnosis includes, but is not limited to, AAS, ACS, medication or drug side effect, acute infection such as pneumonia or intra-abdominal infection, pancreatitis, UTI.  The patient is on the cardiac monitor to evaluate for evidence of arrhythmia and/or significant heart rate changes.  Patient symptoms are most concerning for AAS.  Sending the patient for CTA chest/abdomen/pelvis.  His EKG does not appear consistent with STEMI.  It is also similar in morphology to the last EKG on record which is from 2015.  Dr. Clayborn Bigness is on-call for STEMI and reviewed the EKG and agrees that the patient is not appropriate for the Cath Lab at this time but will reassess patient after the scan.  Lab work pending.  Patient is hypertensive at about 85/57 and 1 L normal saline IV bolus is being administered.  Patient in no apparent distress in spite of his symptoms.  Proceeding with CT scan without lab work.     Clinical Course as of 01/22/21 0214  Mon Jan 22, 2021  0000 Troponin I (High Sensitivity)(!): 25 Mild troponin elevation, could be indicative of ACS or of cocaine abuse or nonspecific demand ischemia [CF]   0000 Comprehensive metabolic panel(!) Comprehensive metabolic panel notable for an elevation of his creatinine of 1.89 which appears new. [CF]  0034 SARS Coronavirus 2 by RT PCR: NEGATIVE [CF]  0034 WBC(!!): 97.1 New leukocytosis more suggestive of neoplastic process than infection. [CF]  0035 CT Angio Chest/Abd/Pel for Dissection W and/or Wo Contrast No acute abnormality identified on CTA chest/abdomen/pelvis.  No evidence of dissection. [CF]  1607 Although the patient's leukocytosis of 97.1 is much more strongly suggestive of a neoplastic process than his sepsis, he is also borderline hypotensive but responded to fluids and has evidence of endorgan dysfunction in the form of his acute kidney injury.  Blood draw is proving to be very difficult and I do not yet have a lactic acid.  I will err on  the side of caution and make him code sepsis and give empiric antibiotics including cefepime 2 g IV, metronidazole 500 mg IV, and vancomycin per pharmacy consult.  I am also consulting hospitalist for admission.  Patient is hemodynamically stable at this time and his blood pressure is improving.  He is getting a total of 3 L of IV fluids which is 30 mL/kg. [CF]  0118 Discussed case by phone with Dr. Sidney Ace with the hospitalist service.  He will admit.  Of note the patient has been to the [place] and I talked to the patient and when I updated him he said that he wants to stay here and will sign a form that says that.  Additionally I am not sure that he is stable for transport and for the delay of care that would be required for transfer to the [place] even if they have a bed available so I think that staying at [facility] is appropriate.  Also of note he does not know of any prior diagnosis of leukemia that would explain his severe leukocytosis. [CF]  0213 Of note, [facility] that the patient would have to stay here anyway. [CF]    Clinical Course User Index [CF] Hinda Kehr, MD      ____________________________________________  FINAL CLINICAL IMPRESSION(S) / ED DIAGNOSES  Final diagnoses:  Chest pain, unspecified type  Elevated troponin  Leukocytosis, unspecified type  Hypotension, unspecified hypotension type  Polysubstance abuse ([place])  Acute kidney injury ([place])  Septic shock ([place])     MEDICATIONS GIVEN DURING THIS VISIT:  Medications  lactated ringers bolus 1,000 mL (has no administration in time range)  metroNIDAZOLE (FLAGYL) IVPB 500 mg (500 mg Intravenous New Bag/Given 01/22/21 0202)  vancomycin (VANCOCIN) IVPB 1000 mg/200 mL premix (has no administration in time range)    Followed by  vancomycin (VANCOREADY) IVPB 1250 mg/250 mL (has no administration in time range)  enoxaparin (LOVENOX) injection 40 mg (has no administration in time range)  0.9 %  sodium chloride infusion (has no administration in time range)  acetaminophen (TYLENOL) tablet 650 mg (has no administration in time range)    Or  acetaminophen (TYLENOL) suppository 650 mg (has no administration in time range)  traZODone (DESYREL) tablet 25 mg (has no administration in time range)  magnesium hydroxide (MILK OF MAGNESIA) suspension 30 mL (has no administration in time range)  ondansetron (ZOFRAN) tablet 4 mg (has no administration in time range)    Or  ondansetron (ZOFRAN) injection 4 mg (has no administration in time range)  sodium chloride 0.9 % bolus 1,000 mL (0 mLs Intravenous Stopped 01/22/21 0045)  iohexol (OMNIPAQUE) 350 MG/ML injection 100 mL (100 mLs Intravenous Contrast Given 01/21/21 2337)  ceFEPIme (MAXIPIME) 2 g in sodium chloride 0.9 % 100 mL IVPB (2 g Intravenous New Bag/Given 01/22/21 0129)     ED Discharge Orders    None      *Please note:  VENICE LIZ was evaluated in Emergency Department on 01/22/2021 for the symptoms described in the history of present illness. He was evaluated in the context of the global COVID-19 pandemic, which necessitated consideration  that the patient might be at risk for infection with the SARS-CoV-2 virus that causes COVID-19. Institutional protocols and algorithms that pertain to the evaluation of patients at risk for COVID-19 are in a state of rapid change based on information released by regulatory bodies including the CDC and federal and state organizations. These policies and  algorithms were followed during the patient's care in the ED.  Some ED evaluations and interventions may be delayed as a result of limited staffing during and after the pandemic.*  Note:  This document was prepared using Dragon voice recognition software and may include unintentional dictation errors.   Hinda Kehr, MD 01/22/21 [phone]

## 2021-01-21 NOTE — ED Triage Notes (Signed)
60 y/o male arrived to the [facility] via EMS coming from with a CC of a syncope. EMS states pt syncope was wittnessed. Pt reports nausea and vomiting x1.  Pt reports thoracic back pain. EMS reports BP 80/40s. Pt denies chest pain. EMS also states elevation in leads v1, v2 and v3. Code stemi called enroute to [facility]. Pt arrived to [facility] room 7 alert and oriented x4 cool pale and diaphoretic

## 2021-01-21 NOTE — ED Notes (Signed)
Cardiac pads placed on patient at this time

## 2021-01-22 ENCOUNTER — Other Ambulatory Visit: Payer: Self-pay

## 2021-01-22 DIAGNOSIS — D72823 Leukemoid reaction: Secondary | ICD-10-CM

## 2021-01-22 DIAGNOSIS — R55 Syncope and collapse: Secondary | ICD-10-CM | POA: Diagnosis not present

## 2021-01-22 DIAGNOSIS — R0789 Other chest pain: Secondary | ICD-10-CM

## 2021-01-22 DIAGNOSIS — D729 Disorder of white blood cells, unspecified: Secondary | ICD-10-CM

## 2021-01-22 DIAGNOSIS — N179 Acute kidney failure, unspecified: Secondary | ICD-10-CM | POA: Diagnosis present

## 2021-01-22 LAB — CBC WITH DIFFERENTIAL/PLATELET
Abs Immature Granulocytes: 21.4 10*3/uL — ABNORMAL HIGH (ref 0.00–0.07)
Basophils Absolute: 0 10*3/uL (ref 0.0–0.1)
Basophils Relative: 0 %
Eosinophils Absolute: 1 10*3/uL — ABNORMAL HIGH (ref 0.0–0.5)
Eosinophils Relative: 1 %
HCT: 39.4 % (ref 39.0–52.0)
Hemoglobin: 12.6 g/dL — ABNORMAL LOW (ref 13.0–17.0)
Lymphocytes Relative: 10 %
Lymphs Abs: 9.7 10*3/uL — ABNORMAL HIGH (ref 0.7–4.0)
MCH: 29.9 pg (ref 26.0–34.0)
MCHC: 32 g/dL (ref 30.0–36.0)
MCV: 93.6 fL (ref 80.0–100.0)
Metamyelocytes Relative: 2 %
Monocytes Absolute: 3.9 10*3/uL — ABNORMAL HIGH (ref 0.1–1.0)
Monocytes Relative: 4 %
Myelocytes: 18 %
Neutro Abs: 54.4 10*3/uL — ABNORMAL HIGH (ref 1.7–7.7)
Neutrophils Relative %: 56 %
Other: 7 %
Platelets: 316 10*3/uL (ref 150–400)
Promyelocytes Relative: 2 %
RBC: 4.21 MIL/uL — ABNORMAL LOW (ref 4.22–5.81)
RDW: 15.6 % — ABNORMAL HIGH (ref 11.5–15.5)
Smear Review: NORMAL
WBC: 97.1 10*3/uL (ref 4.0–10.5)
nRBC: 1 % — ABNORMAL HIGH (ref 0.0–0.2)

## 2021-01-22 LAB — URINALYSIS, ROUTINE W REFLEX MICROSCOPIC
Bacteria, UA: NONE SEEN
Bilirubin Urine: NEGATIVE
Glucose, UA: 50 mg/dL — AB
Hgb urine dipstick: NEGATIVE
Ketones, ur: NEGATIVE mg/dL
Leukocytes,Ua: NEGATIVE
Nitrite: NEGATIVE
Protein, ur: 30 mg/dL — AB
Specific Gravity, Urine: 1.036 — ABNORMAL HIGH (ref 1.005–1.030)
pH: 5 (ref 5.0–8.0)

## 2021-01-22 LAB — TYPE AND SCREEN
ABO/RH(D): O POS
Antibody Screen: NEGATIVE

## 2021-01-22 LAB — URINE DRUG SCREEN, QUALITATIVE (ARMC ONLY)
Amphetamines, Ur Screen: NOT DETECTED
Barbiturates, Ur Screen: NOT DETECTED
Benzodiazepine, Ur Scrn: NOT DETECTED
Cannabinoid 50 Ng, Ur ~~LOC~~: POSITIVE — AB
Cocaine Metabolite,Ur ~~LOC~~: POSITIVE — AB
MDMA (Ecstasy)Ur Screen: NOT DETECTED
Methadone Scn, Ur: NOT DETECTED
Opiate, Ur Screen: POSITIVE — AB
Phencyclidine (PCP) Ur S: NOT DETECTED
Tricyclic, Ur Screen: POSITIVE — AB

## 2021-01-22 LAB — BASIC METABOLIC PANEL
Anion gap: 9 (ref 5–15)
BUN: 18 mg/dL (ref 6–20)
CO2: 20 mmol/L — ABNORMAL LOW (ref 22–32)
Calcium: 8.7 mg/dL — ABNORMAL LOW (ref 8.9–10.3)
Chloride: 105 mmol/L (ref 98–111)
Creatinine, Ser: 1.29 mg/dL — ABNORMAL HIGH (ref 0.61–1.24)
GFR, Estimated: >60 mL/min (ref >60)
Glucose, Bld: 240 mg/dL — ABNORMAL HIGH (ref 70–99)
Potassium: 4.3 mmol/L (ref 3.5–5.1)
Sodium: 134 mmol/L — ABNORMAL LOW (ref 135–145)

## 2021-01-22 LAB — PROTIME-INR
INR: 1.1 (ref 0.8–1.2)
Prothrombin Time: 13.8 seconds (ref 11.4–15.2)

## 2021-01-22 LAB — CBC
HCT: 34.7 % — ABNORMAL LOW (ref 39.0–52.0)
Hemoglobin: 11.4 g/dL — ABNORMAL LOW (ref 13.0–17.0)
MCH: 30.2 pg (ref 26.0–34.0)
MCHC: 32.9 g/dL (ref 30.0–36.0)
MCV: 92 fL (ref 80.0–100.0)
Platelets: 280 10*3/uL (ref 150–400)
RBC: 3.77 MIL/uL — ABNORMAL LOW (ref 4.22–5.81)
RDW: 15.6 % — ABNORMAL HIGH (ref 11.5–15.5)
WBC: 84.7 10*3/uL (ref 4.0–10.5)
nRBC: 0.6 % — ABNORMAL HIGH (ref 0.0–0.2)

## 2021-01-22 LAB — HIV ANTIBODY (ROUTINE TESTING W REFLEX): HIV Screen 4th Generation wRfx: NONREACTIVE

## 2021-01-22 LAB — LACTATE DEHYDROGENASE: LDH: 453 U/L — ABNORMAL HIGH (ref 98–192)

## 2021-01-22 LAB — PROCALCITONIN: Procalcitonin: <0.1 ng/mL

## 2021-01-22 LAB — PATHOLOGIST SMEAR REVIEW

## 2021-01-22 LAB — APTT: aPTT: <24 seconds — ABNORMAL LOW (ref 24–36)

## 2021-01-22 LAB — TROPONIN I (HIGH SENSITIVITY)
Troponin I (High Sensitivity): 16 ng/L (ref <18)
Troponin I (High Sensitivity): 16 ng/L (ref <18)

## 2021-01-22 LAB — LACTIC ACID, PLASMA
Lactic Acid, Venous: 2.3 mmol/L (ref 0.5–1.9)
Lactic Acid, Venous: 1 mmol/L (ref 0.5–1.9)

## 2021-01-22 LAB — ETHANOL: Alcohol, Ethyl (B): <10 mg/dL (ref <10)

## 2021-01-22 MED ORDER — CYCLOBENZAPRINE HCL 10 MG PO TABS
10.0000 mg | ORAL_TABLET | Freq: Three times a day (TID) | ORAL | Status: DC | PRN
  Administered 2021-01-22: 10 mg via ORAL
  Filled 2021-01-22: qty 1

## 2021-01-22 MED ORDER — ENOXAPARIN SODIUM 40 MG/0.4ML ~~LOC~~ SOLN
40.0000 mg | SUBCUTANEOUS | Status: DC
  Administered 2021-01-22 – 2021-01-23 (×2): 40 mg via SUBCUTANEOUS
  Filled 2021-01-22 (×2): qty 0.4

## 2021-01-22 MED ORDER — LACTATED RINGERS IV BOLUS
1000.0000 mL | Freq: Once | INTRAVENOUS | Status: AC
  Administered 2021-01-22: 1000 mL via INTRAVENOUS

## 2021-01-22 MED ORDER — METRONIDAZOLE IN NACL 5-0.79 MG/ML-% IV SOLN
500.0000 mg | Freq: Once | INTRAVENOUS | Status: AC
  Administered 2021-01-22: 500 mg via INTRAVENOUS
  Filled 2021-01-22: qty 100

## 2021-01-22 MED ORDER — SODIUM CHLORIDE 0.9 % IV SOLN: INTRAVENOUS | Status: DC

## 2021-01-22 MED ORDER — MAGNESIUM HYDROXIDE 400 MG/5ML PO SUSP: 30.0000 mL | Freq: Every day | ORAL | Status: DC | PRN

## 2021-01-22 MED ORDER — VANCOMYCIN HCL 1250 MG/250ML IV SOLN
1250.0000 mg | Freq: Once | INTRAVENOUS | Status: AC
  Administered 2021-01-22: 1250 mg via INTRAVENOUS
  Filled 2021-01-22: qty 250

## 2021-01-22 MED ORDER — ACETAMINOPHEN 325 MG PO TABS
650.0000 mg | ORAL_TABLET | Freq: Four times a day (QID) | ORAL | Status: DC | PRN
  Administered 2021-01-22 – 2021-01-23 (×2): 650 mg via ORAL
  Filled 2021-01-22 (×2): qty 2

## 2021-01-22 MED ORDER — SODIUM CHLORIDE 0.9 % IV BOLUS
1000.0000 mL | Freq: Once | INTRAVENOUS | Status: AC
  Administered 2021-01-22: 1000 mL via INTRAVENOUS

## 2021-01-22 MED ORDER — ACETAMINOPHEN 650 MG RE SUPP: 650.0000 mg | Freq: Four times a day (QID) | RECTAL | Status: DC | PRN

## 2021-01-22 MED ORDER — TRAZODONE HCL 50 MG PO TABS
25.0000 mg | ORAL_TABLET | Freq: Every evening | ORAL | Status: DC | PRN
  Administered 2021-01-22: 25 mg via ORAL
  Filled 2021-01-22: qty 1

## 2021-01-22 MED ORDER — VANCOMYCIN HCL IN DEXTROSE 1-5 GM/200ML-% IV SOLN: 1000.0000 mg | Freq: Once | INTRAVENOUS | Status: DC

## 2021-01-22 MED ORDER — LACTATED RINGERS IV BOLUS (SEPSIS): 2000.0000 mL | Freq: Once | INTRAVENOUS | Status: DC

## 2021-01-22 MED ORDER — SODIUM CHLORIDE 0.9 % IV SOLN
2.0000 g | Freq: Once | INTRAVENOUS | Status: AC
  Administered 2021-01-22: 2 g via INTRAVENOUS
  Filled 2021-01-22: qty 2

## 2021-01-22 MED ORDER — ONDANSETRON HCL 4 MG PO TABS: 4.0000 mg | ORAL_TABLET | Freq: Four times a day (QID) | ORAL | Status: DC | PRN

## 2021-01-22 MED ORDER — ONDANSETRON HCL 4 MG/2ML IJ SOLN: 4.0000 mg | Freq: Four times a day (QID) | INTRAMUSCULAR | Status: DC | PRN

## 2021-01-22 MED ORDER — VANCOMYCIN HCL IN DEXTROSE 1-5 GM/200ML-% IV SOLN
1000.0000 mg | Freq: Once | INTRAVENOUS | Status: AC
  Administered 2021-01-22: 1000 mg via INTRAVENOUS
  Filled 2021-01-22: qty 200

## 2021-01-22 NOTE — Progress Notes (Signed)
PHARMACY -  BRIEF ANTIBIOTIC NOTE   Pharmacy has received consult(s) for Cefepime and Vanc from an ED provider.  The patient's profile has been reviewed for ht/wt/allergies/indication/available labs.    One time order(s) placed for Cefepime 2 gm and Vancomycin 2250 mg per pt wt 89.1kg.  Further antibiotics/pharmacy consults should be ordered by admitting physician if indicated.       Renda Rolls, PharmD, [facility] 01/22/2021 1:02 AM

## 2021-01-22 NOTE — Care Management Obs Status (Signed)
[place] NOTIFICATION   Patient Details  Name: Frank [name] MRN: [id number] Date of Birth: 1961/07/16   Medicare Observation Status Notification Given:  Yes    Kerin Salen, RN 01/22/2021, 3:19 PM

## 2021-01-22 NOTE — Assessment & Plan Note (Addendum)
#  60 year old male patient history of poorly controlled diabetes; hypertension; polysubstance abuse-admitted to the hospital for chest pain/near syncope-probably have elevated white count.  #Leukocytosis/predominant neutrophilia of 90,000-etiology unclear-leukemoid reaction versus underlying malignancy.  CT scan-no evidence of lymphadenopathy/hepatosplenomegaly.  #Chest pain-cardiac versus others.  Defer to primary team.  #Polysubstance abuse-positive for cocaine.  #Poorly controlled diabetes-on insulin.  Recommendations:  #With regards to leukocytosis-recommend checking peripheral smear.  Check FISH for BCR ABL.   Thank you  for allowing me to participate in the care of your pleasant patient. Please do not hesitate to contact me with questions or concerns in the interim.

## 2021-01-22 NOTE — Progress Notes (Signed)
Elink following for Sepsis Protocol 

## 2021-01-22 NOTE — ED Notes (Signed)
Pt given urinal. Room is now ready.

## 2021-01-22 NOTE — Progress Notes (Signed)
CODE SEPSIS - PHARMACY COMMUNICATION  **Broad Spectrum Antibiotics should be administered within 1 hour of Sepsis diagnosis**  Time Code Sepsis Called/Page Received: 0051  Antibiotics Ordered: Cefepime, Vanc, & Flagyl  Time of 1st antibiotic administration: 0129  Renda Rolls, PharmD, MBA 01/22/2021 1:01 AM

## 2021-01-22 NOTE — ED Notes (Signed)
This nurse and lab have had several attempts at obtaining blood cultures.

## 2021-01-22 NOTE — ED Notes (Signed)
Critical lab  WBC-98.1  MD Karma Greaser notified face to face

## 2021-01-22 NOTE — ED Notes (Addendum)
Laboratory tech states she is unable to obtain labs due to pt being a hard blood draw. MD Mansy aware

## 2021-01-22 NOTE — ED Notes (Signed)
PT BED CLEANED AND PADS REMOVED. PT IS ABLE TO STAND ON HIS FEET.

## 2021-01-22 NOTE — H&P (Signed)
[place]   PATIENT NAME: Frank [name]    MR#:  [id number]  DATE OF BIRTH:  10/06/1961  DATE OF ADMISSION:  01/21/2021  PRIMARY CARE PHYSICIAN: System, Provider Not In   Patient is coming from: Home  REQUESTING/REFERRING PHYSICIAN: Hinda Kehr, MD CHIEF COMPLAINT:   Chief Complaint  Patient presents with  . Loss of Consciousness    HISTORY OF PRESENT ILLNESS:  Frank [name] is a 60 y.o. African-American male with medical history significant for hypertension, dyslipidemia alcoholic liver disease, type 2 diabetes mellitus and chronic low back pain, who presented to the emergency room with acute onset of syncope with associated midthoracic back pain felt this tightness and graded 9/10 in severity radiating to his left shoulder and arm and episode of nausea and vomiting.  There was no head injuries.  The patient's blood pressure was down to 80/40 per EMS.  He has been having dyspnea with his symptoms and was seen by EMS EKG without ST segment elevation in anteroseptal leads.  Code STEMI was called however the patient was noted not to have reciprocal EKG changes or elevation above 2 mm.  With hypotension he was noted however to be alert and oriented but cool, pale and diaphoretic.  He denied any abdominal pain or anterior chest pain.  The patient admits to cocaine and marijuana abuse as well as alcohol abuse.  No bleeding diathesis.  No cough or wheezing or hemoptysis.  No leg pain or edema recent travels or surgeries.  The patient had 2 COVID-19 vaccines.  ED Course: When he came to the ER, blood pressure was 80/51 with otherwise normal vital signs.  Labs revealed blood glucose of 186 with BUN of 22 and creatinine 1.89 and potassium 5.1.  High-sensitivity troponin I was 25.  CBC shows significant leukocytosis of 97.1 with 21.4 immature granulocytes 18 myelocytes and 54.4 neutrophils with 9.7 lymphocytes with atypical mononuclear cells.  Influenza antigens and COVID-19 PCR came back  negative.  Alcohol level was less than 10.  Urine drug screen came back positive for cocaine cannabinoids and opiates as well as tricyclic's.  UA was unremarkable except of 50 glucose with 30 proteins.  Specific gravity was 1036.  EKG as reviewed by me : Showed sinus rhythm with a rate of 65 with 1 mm ST segment elevation in V1 and V2 and half a millimeter in V3 without reciprocal inferior changes.  It also showed T wave inversion in aVL Imaging: Chest and abdominal CTA revealed the following: No evidence of thoracoabdominal aortic aneurysm or dissection. Mild to moderate aortoiliac atherosclerotic calcification without evidence of hemodynamically significant stenosis.  Minimal coronary artery calcification. Mild left ventricular hypertrophy.  Chronic pancreatitis. No superimposed acute inflammatory changes identified.  Aortic Atherosclerosis  The patient was given IV cefepime, vancomycin and Flagyl, 1 L bolus of IV lactated Ringer and 1 L bolus of IV normal saline.  The patient will be admitted to a progressive unit bed for further evaluation and management. PAST MEDICAL HISTORY:   Past Medical History:  Diagnosis Date  . Arthritis   . Chronic low back pain with sciatica   . CKD (chronic kidney disease)    unknown stage  . Diabetes mellitus without complication (HCC)    insulin pump therapy  . Hemorrhoids   . History of pancreatitis   . Hyperlipidemia   . Hypertension   . 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 Smoking status: Former Research scientist (life sciences)  . Smokeless tobacco: Never Used  Substance Use Topics  . Alcohol use: Yes    Comment: hx of ETOH abuse    FAMILY HISTORY:  Possible diabetes mellitus in his mother and emphysema in his father.  DRUG ALLERGIES:  Not on File  REVIEW OF SYSTEMS:   ROS As per history of present illness. All pertinent systems were reviewed above.  Constitutional, HEENT, cardiovascular, respiratory, GI, GU, musculoskeletal, neuro, psychiatric, endocrine, integumentary and hematologic systems were reviewed and are otherwise negative/unremarkable except for positive findings mentioned above in the HPI.   MEDICATIONS AT HOME:   Prior to Admission medications   Not on File      VITAL SIGNS:  Blood pressure 113/74, pulse 76, temperature 98.2 F (36.8 C), resp. rate 18, weight 89.1 kg, SpO2 100 %.  PHYSICAL EXAMINATION:  Physical Exam  GENERAL:  60 y.o.-year-old African-American male patient lying in the bed with no acute distress.  EYES: Pupils equal, round, reactive to light and accommodation. No scleral icterus. Extraocular muscles intact.  HEENT: Head atraumatic, normocephalic. Oropharynx and nasopharynx clear.  NECK:  Supple, no jugular venous distention. No thyroid enlargement, no tenderness.  LUNGS: Normal breath sounds bilaterally, no wheezing, rales,rhonchi or crepitation. No use of accessory muscles of respiration.  CARDIOVASCULAR: Regular rate and rhythm, S1, S2 normal. No murmurs, rubs, or gallops.  ABDOMEN: Soft, nondistended, nontender. Bowel sounds present. No organomegaly or mass.  EXTREMITIES: No pedal edema, cyanosis, or clubbing.  NEUROLOGIC: Cranial nerves II through XII are intact. Muscle strength 5/5 in all extremities. Sensation intact. Gait not checked.  PSYCHIATRIC: The patient is alert and oriented x 3.  Normal affect and good eye contact. SKIN: No obvious rash, lesion, or ulcer.   LABORATORY PANEL:   CBC Recent Labs  Lab 01/21/21 2255  WBC 97.1*  HGB 12.6*  HCT 39.4  PLT 316   ------------------------------------------------------------------------------------------------------------------  Chemistries  Recent Labs  Lab 01/21/21 2255  NA 135  K 5.1  CL 107  CO2 21*  GLUCOSE 186*  BUN 22*  CREATININE 1.89*  CALCIUM 9.3  AST 32  ALT 20  ALKPHOS 66  BILITOT 0.7    ------------------------------------------------------------------------------------------------------------------  Cardiac Enzymes No results for input(s): TROPONINI in the last 168 hours. ------------------------------------------------------------------------------------------------------------------  RADIOLOGY:  CT Angio Chest/Abd/Pel for Dissection W and/or Wo Contrast  Result Date: 01/22/2021 CLINICAL DATA:  Syncope, hypotension, thoracic back and abdominal pain. EXAM: CT ANGIOGRAPHY CHEST, ABDOMEN AND PELVIS TECHNIQUE: Non-contrast CT of the chest was initially obtained. Multidetector CT imaging through the chest, abdomen and pelvis was performed using the standard protocol during bolus administration of intravenous contrast. Multiplanar reconstructed images and MIPs were obtained and reviewed to evaluate the vascular anatomy. CONTRAST:  167mL OMNIPAQUE IOHEXOL 350 MG/ML SOLN COMPARISON:  05/11/2010 FINDINGS: CTA CHEST FINDINGS Cardiovascular: The thoracic aorta is normal in course and caliber. No intramural hematoma, dissection, or aneurysm. Arch vasculature is widely patent proximally. No significant atherosclerotic plaque identified. Global cardiac size is within normal limits. Mild left ventricular hypertrophy. Minimal coronary artery calcification. No pericardial effusion. The central pulmonary arteries are of normal caliber. Mediastinum/Nodes: No enlarged mediastinal, hilar, or axillary lymph nodes. Thyroid gland, trachea, and esophagus demonstrate no significant findings. Lungs/Pleura: Mild dependent atelectasis. No superimposed focal pulmonary nodule or infiltrate. No pneumothorax or pleural effusion. Central airways are widely patent. Musculoskeletal: No acute bone abnormality. No lytic or blastic bone lesions. Review of the  MIP images confirms the above findings. CTA ABDOMEN AND PELVIS FINDINGS VASCULAR Aorta: Mild mixed atherosclerotic plaque. No aneurysm or dissection. No perivascular  inflammatory change. Celiac: Widely patent.  Normal anatomic configuration. SMA: Widely patent. Renals: Single renal arteries bilaterally. Widely patent. Normal vascular morphology. No aneurysm. IMA: Widely patent. Inflow: Moderate mixed atherosclerotic plaque without evidence of hemodynamically significant stenosis. No aneurysm or dissection. Internal iliac arteries are patent bilaterally. Veins: Unremarkable Review of the MIP images confirms the above findings. NON-VASCULAR Hepatobiliary: No focal liver abnormality is seen. No gallstones, gallbladder wall thickening, or biliary dilatation. Pancreas: Parenchymal calcifications are seen throughout the pancreatic parenchyma in keeping with changes of chronic pancreatitis. No peripancreatic inflammatory changes are identified. There is no pancreatic ductal dilation noted. Spleen: Unremarkable Adrenals/Urinary Tract: Adrenal glands are unremarkable. Kidneys are normal, without renal calculi, focal lesion, or hydronephrosis. Bladder is unremarkable. Stomach/Bowel: Stomach is within normal limits. Appendix appears normal. No evidence of bowel wall thickening, distention, or inflammatory changes. No free intraperitoneal gas or fluid. Lymphatic: No pathologic adenopathy within the abdomen and pelvis. Reproductive: Prostate is unremarkable. Other: Small broad-based fat containing umbilical hernia. Musculoskeletal: No acute bone abnormality. No lytic or blastic bone lesions are identified within the abdomen and pelvis. Review of the MIP images confirms the above findings. IMPRESSION: No evidence of thoracoabdominal aortic aneurysm or dissection. Mild to moderate aortoiliac atherosclerotic calcification without evidence of hemodynamically significant stenosis. Minimal coronary artery calcification. Mild left ventricular hypertrophy. Chronic pancreatitis. No superimposed acute inflammatory changes identified. Aortic Atherosclerosis (ICD10-I70.0). Electronically Signed   By:  Fidela Salisbury MD   On: 01/22/2021 00:21      IMPRESSION AND PLAN:  Active Problems:   AKI (acute kidney injury) ([place])  1.  Syncope like secondary to orthostatic hypotension with associated symptomatic hypotension. -The patient was admitted to a progressive unit bed. -He will be aggressively hydrated with IV normal saline. -We will follow orthostatics. -We will hold off antihypertensives. -We will follow serial troponin I's.  2.  Posterior chest/back pain with abnormal EKG. -The patient was ruled out aortic dissection and PE. -We will follow serial troponin I's. -Cardiology consult will be obtained. -I notified Dr. Clayborn Bigness about the patient.  3.  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  I doubt infectious etiology in his case, sepsis or severe sepsis.  We will monitor his blood pressure and hold off on further antibiotic therapy. -Oncology consultation will be obtained. -I notified Dr. Rogue Bussing about the patient. -We will follow CBC with differential.  4.  Acute kidney injury, likely hypovolemic. -We will hold off HCTZ and hydrate the patient with IV normal saline. -We will follow BMPs.  5.  Dyslipidemia. -We will continue statin therapy.  6.  History of hypertension. -We will hold off HCTZ.  7.  Chronic pancreatitis. -We will continue Creon.  8.  GERD. -We will continue PPI therapy.  9.  Polysubstance abuse including cocaine, marijuana, and alcohol. -I counseled him for cessation.  DVT prophylaxis: Lovenox. Code Status: full code. Family Communication:  The plan of care was discussed in details with the patient (and family). I answered all questions. The patient agreed to proceed with the above mentioned plan. Further management will depend upon hospital course. Disposition Plan: Back to previous home environment Consults called: Cardiology consult and hematology/oncology consult. All the records are reviewed and case discussed with ED  provider.  Status is: Inpatient  Remains inpatient appropriate because:Ongoing active pain requiring inpatient pain management, Ongoing diagnostic testing needed not appropriate  for outpatient work up, Unsafe d/c plan, IV treatments appropriate due to intensity of illness or inability to take PO and Inpatient level of care appropriate due to severity of illness   Dispo: The patient is from: Home              Anticipated d/c is to: Home              Patient currently is not medically stable to d/c.   Difficult to place patient No  TOTAL TIME TAKING CARE OF THIS PATIENT: 55 minutes.    Christel Mormon M.D on 01/22/2021 at 1:41 AM  [facility]   From 7 PM-7 AM, contact night-coverage [email or web address]  CC: Primary care physician; System, Provider Not In

## 2021-01-22 NOTE — Care Plan (Signed)
This 60 yrs old African-American male with PMH significant for HTN, HLD, Alcoholic liver disease, type II DM, Chronic back pain presenting to the emergency department with acute onset of syncope associated with midthoracic back pain which he describes as tightness radiating towards left shoulder and arm and also reports episodes of nausea and vomiting.  Patient was found to be hypotensive after EMS arrived,  Patient was brought in as a code STEMI however these were found to be reciprocal EKG changes.  CBC shows leukocytosis of 97.1K with 21.4 immature granulocytes, 18 myelocytes, 54.4 neutrophils with 9.7 lymphocytes,  atypical mononuclear cells. Patient is admitted for syncope with orthostatic hypotension.  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  Sepsis ruled out.  Hematology consulted Dr. Rogue Bussing consulted.  Patient continued on IV fluids,  all other home medications continued.  Urine drug screen positive for cocaine, opiates, TCA, benzos.  Patient was seen and examined in the ED.

## 2021-01-22 NOTE — ED Notes (Signed)
This has made several attempts to obtain morning lab on this pt and has been unsuccessful due to the pt being a difficult blood draw

## 2021-01-22 NOTE — Care Management CC44 (Signed)
Condition Code 44 Documentation Completed  Patient Details  Name: Frank [name] MRN: [id number] Date of Birth: [birth year]   Condition Code 44 given:  Yes Patient signature on Condition Code 44 notice:  Yes Documentation of 2 MD's agreement:  Yes Code 44 added to claim:  Yes    Kerin Salen, RN 01/22/2021, 3:19 PM

## 2021-01-22 NOTE — ED Notes (Signed)
This nurse has had several attempts at obtaining lactic acid,  PTT, and type and screen labs and has been unsuccessful. Lab has been called multiple times and is at bedside obtaining blood draw at this time

## 2021-01-22 NOTE — Consult Note (Signed)
[facility] CONSULT NOTE  Patient Care Team: System, Provider Not In as PCP - General  CHIEF COMPLAINTS/PURPOSE OF CONSULTATION: Leucocytosis  HISTORY OF PRESENTING ILLNESS:  Frank [name] 60 y.o.  male with history of alcoholic liver disease, poorly controlled diabetes is currently in the hospital for syncope associated with chest pain.  Based on EKG changes it was thought patient had STEMI; however further evaluation deemed the patient non-STEMI.  Patient was hypotensive with systolic blood pressure is 80/40.  Patient is currently resting stable.  Urine drug screen showed positive for cocaine and marijuana as well as alcohol.   Of note patient noted to have elevated white count of 90,000; predominant neutrophilia.  Hemoglobin around 11-12 platelets normal..  Hematology has been consulted for further evaluation recommendations.   Patient follows up with [facility] in [place].  He declines being aware of elevated white count.   Review of Systems  Constitutional: Positive for malaise/fatigue and weight loss. Negative for chills, diaphoresis and fever.  HENT: Negative for nosebleeds and sore throat.   Eyes: Negative for double vision.  Respiratory: Positive for shortness of breath. Negative for cough, hemoptysis, sputum production and wheezing.   Cardiovascular: Positive for chest pain. Negative for palpitations, orthopnea and leg swelling.  Gastrointestinal: Negative for abdominal pain, blood in stool, constipation, diarrhea, heartburn, melena, nausea and vomiting.  Genitourinary: Negative for dysuria, frequency and urgency.  Musculoskeletal: Positive for back pain and joint pain.  Skin: Negative.  Negative for itching and rash.  Neurological: Negative for dizziness, tingling, focal weakness, weakness and headaches.  Endo/Heme/Allergies: Does not bruise/bleed easily.  Psychiatric/Behavioral: Negative for depression. The patient is not nervous/anxious and does not have insomnia.       MEDICAL HISTORY:  Past Medical History:  Diagnosis Date  . Arthritis   . Chronic low back pain with sciatica   . CKD (chronic kidney disease)    unknown stage  . Diabetes mellitus without complication (HCC)    insulin pump therapy  . Hemorrhoids   . History of pancreatitis   . Hyperlipidemia   . Hypertension   . 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 No pertinent surgical history.  SOCIAL HISTORY: Social History   Socioeconomic History  . Marital status: Single    Spouse name: Not on file  . Number of children: Not on file  . Years of education: Not on file  . Highest education level: Not on file  Occupational History  . Not on file  Tobacco Use  . Smoking status: Former Research scientist (life sciences)  . Smokeless tobacco: Never Used  Substance and Sexual Activity  . Alcohol use: Yes    Comment: hx of ETOH abuse  . Drug use: Yes    Types: Cocaine, Marijuana  . Sexual activity: Not on file  Other Topics Concern  . Not on file  Social History Narrative  . Not on file   Social Determinants of Health   Financial Resource Strain: Not on file  Food Insecurity: Not on file  Transportation Needs: Not on file  Physical Activity: Not on file  Stress: Not on file  Social Connections: Not on file  Intimate Partner Violence: Not on file    FAMILY HISTORY: History reviewed. No pertinent family history.  ALLERGIES:  has No Known Allergies.  MEDICATIONS:  Current Facility-Administered Medications  Medication Dose Route Frequency Provider Last Rate Last Admin  . 0.9 %  sodium chloride infusion   Intravenous Continuous Mansy,  Jan A, MD 100 mL/hr at 01/22/21 1810 New Bag at 01/22/21 1810  . acetaminophen (TYLENOL) tablet 650 mg  650 mg Oral Q6H PRN Mansy, Jan A, MD   650 mg at 01/22/21 1816   Or  . acetaminophen (TYLENOL) suppository 650 mg  650 mg Rectal Q6H PRN Mansy, Jan A, MD      . cyclobenzaprine (FLEXERIL) tablet 10 mg  10 mg Oral TID PRN  Sharion Settler, NP   10 mg at 01/22/21 2122  . enoxaparin (LOVENOX) injection 40 mg  40 mg Subcutaneous Q24H Mansy, Jan A, MD   40 mg at 01/22/21 1033  . magnesium hydroxide (MILK OF MAGNESIA) suspension 30 mL  30 mL Oral Daily PRN Mansy, Jan A, MD      . ondansetron [facility]) tablet 4 mg  4 mg Oral Q6H PRN Mansy, Jan A, MD       Or  . ondansetron [facility]) injection 4 mg  4 mg Intravenous Q6H PRN Mansy, Jan A, MD      . traZODone (DESYREL) tablet 25 mg  25 mg Oral QHS PRN Mansy, Jan A, MD   25 mg at 01/22/21 2122     PHYSICAL EXAMINATION:  Vitals:   01/22/21 1152 01/22/21 2056  BP: 118/78 129/72  Pulse: 75 70  Resp: 20 18  Temp: 97.7 F (36.5 C) 98.5 F (36.9 C)  SpO2: 100% 100%   Filed Weights   01/21/21 2257  Weight: 196 lb 6.9 oz (89.1 kg)    Physical Exam HENT:     Head: Normocephalic and atraumatic.     Mouth/Throat:     Mouth: Oropharynx is clear and moist.     Pharynx: No oropharyngeal exudate.  Eyes:     Pupils: Pupils are equal, round, and reactive to light.  Cardiovascular:     Rate and Rhythm: Normal rate and regular rhythm.  Pulmonary:     Effort: No respiratory distress.     Breath sounds: No wheezing.     Comments: Decreased breath sounds bilaterally.  Abdominal:     General: Bowel sounds are normal. There is no distension.     Palpations: Abdomen is soft. There is no mass.     Tenderness: There is no abdominal tenderness. There is no guarding or rebound.  Musculoskeletal:        General: No tenderness or edema. Normal range of motion.     Cervical back: Normal range of motion and neck supple.  Skin:    General: Skin is warm.  Neurological:     Mental Status: He is alert and oriented to person, place, and time.  Psychiatric:        Mood and Affect: Affect normal.    LABORATORY DATA:  I have reviewed the data as listed Lab Results  Component Value Date   WBC 84.7 (HH) 01/22/2021   HGB 11.4 (L) 01/22/2021   HCT 34.7 (L) 01/22/2021   MCV 92.0  01/22/2021   PLT 280 01/22/2021   Recent Labs    01/21/21 2255 01/22/21 0629  NA 135 134*  K 5.1 4.3  CL 107 105  CO2 21* 20*  GLUCOSE 186* 240*  BUN 22* 18  CREATININE 1.89* 1.29*  CALCIUM 9.3 8.7*  GFRNONAA 40* >60  PROT 7.1  --   ALBUMIN 4.0  --   AST 32  --   ALT 20  --   ALKPHOS 66  --   BILITOT 0.7  --     RADIOGRAPHIC  STUDIES: I have personally reviewed the radiological images as listed and agreed with the findings in the report. CT Angio Chest/Abd/Pel for Dissection W and/or Wo Contrast  Result Date: 01/22/2021 CLINICAL DATA:  Syncope, hypotension, thoracic back and abdominal pain. EXAM: CT ANGIOGRAPHY CHEST, ABDOMEN AND PELVIS TECHNIQUE: Non-contrast CT of the chest was initially obtained. Multidetector CT imaging through the chest, abdomen and pelvis was performed using the standard protocol during bolus administration of intravenous contrast. Multiplanar reconstructed images and MIPs were obtained and reviewed to evaluate the vascular anatomy. CONTRAST:  131mL OMNIPAQUE IOHEXOL 350 MG/ML SOLN COMPARISON:  05/11/2010 FINDINGS: CTA CHEST FINDINGS Cardiovascular: The thoracic aorta is normal in course and caliber. No intramural hematoma, dissection, or aneurysm. Arch vasculature is widely patent proximally. No significant atherosclerotic plaque identified. Global cardiac size is within normal limits. Mild left ventricular hypertrophy. Minimal coronary artery calcification. No pericardial effusion. The central pulmonary arteries are of normal caliber. Mediastinum/Nodes: No enlarged mediastinal, hilar, or axillary lymph nodes. Thyroid gland, trachea, and esophagus demonstrate no significant findings. Lungs/Pleura: Mild dependent atelectasis. No superimposed focal pulmonary nodule or infiltrate. No pneumothorax or pleural effusion. Central airways are widely patent. Musculoskeletal: No acute bone abnormality. No lytic or blastic bone lesions. Review of the MIP images confirms the  above findings. CTA ABDOMEN AND PELVIS FINDINGS VASCULAR Aorta: Mild mixed atherosclerotic plaque. No aneurysm or dissection. No perivascular inflammatory change. Celiac: Widely patent.  Normal anatomic configuration. SMA: Widely patent. Renals: Single renal arteries bilaterally. Widely patent. Normal vascular morphology. No aneurysm. IMA: Widely patent. Inflow: Moderate mixed atherosclerotic plaque without evidence of hemodynamically significant stenosis. No aneurysm or dissection. Internal iliac arteries are patent bilaterally. Veins: Unremarkable Review of the MIP images confirms the above findings. NON-VASCULAR Hepatobiliary: No focal liver abnormality is seen. No gallstones, gallbladder wall thickening, or biliary dilatation. Pancreas: Parenchymal calcifications are seen throughout the pancreatic parenchyma in keeping with changes of chronic pancreatitis. No peripancreatic inflammatory changes are identified. There is no pancreatic ductal dilation noted. Spleen: Unremarkable Adrenals/Urinary Tract: Adrenal glands are unremarkable. Kidneys are normal, without renal calculi, focal lesion, or hydronephrosis. Bladder is unremarkable. Stomach/Bowel: Stomach is within normal limits. Appendix appears normal. No evidence of bowel wall thickening, distention, or inflammatory changes. No free intraperitoneal gas or fluid. Lymphatic: No pathologic adenopathy within the abdomen and pelvis. Reproductive: Prostate is unremarkable. Other: Small broad-based fat containing umbilical hernia. Musculoskeletal: No acute bone abnormality. No lytic or blastic bone lesions are identified within the abdomen and pelvis. Review of the MIP images confirms the above findings. IMPRESSION: No evidence of thoracoabdominal aortic aneurysm or dissection. Mild to moderate aortoiliac atherosclerotic calcification without evidence of hemodynamically significant stenosis. Minimal coronary artery calcification. Mild left ventricular hypertrophy.  Chronic pancreatitis. No superimposed acute inflammatory changes identified. Aortic Atherosclerosis (ICD10-I70.0). Electronically Signed   By: Fidela Salisbury MD   On: 01/22/2021 00:21    Neutrophilia #60 year old male patient history of poorly controlled diabetes; hypertension; polysubstance abuse-admitted to the hospital for chest pain/near syncope-probably have elevated white count.  #Leukocytosis/predominant neutrophilia of 90,000-etiology unclear-leukemoid reaction versus underlying malignancy.  CT scan-no evidence of lymphadenopathy/hepatosplenomegaly.  #Chest pain-cardiac versus others.  Defer to primary team.  #Polysubstance abuse-positive for cocaine.  #Poorly controlled diabetes-on insulin.  Recommendations:  #With regards to leukocytosis-recommend checking peripheral smear.  Check FISH for BCR ABL.   Thank you  for allowing me to participate in the care of your pleasant patient. Please do not hesitate to contact me with questions or concerns in the interim.  All questions were answered. The patient knows to call the clinic with any problems, questions or concerns.    Cammie Sickle, MD 01/22/2021 9:40 PM

## 2021-01-23 DIAGNOSIS — Z794 Long term (current) use of insulin: Secondary | ICD-10-CM | POA: Diagnosis not present

## 2021-01-23 DIAGNOSIS — Z7982 Long term (current) use of aspirin: Secondary | ICD-10-CM | POA: Diagnosis not present

## 2021-01-23 DIAGNOSIS — R55 Syncope and collapse: Secondary | ICD-10-CM | POA: Diagnosis present

## 2021-01-23 DIAGNOSIS — Z79899 Other long term (current) drug therapy: Secondary | ICD-10-CM | POA: Diagnosis not present

## 2021-01-23 DIAGNOSIS — E1122 Type 2 diabetes mellitus with diabetic chronic kidney disease: Secondary | ICD-10-CM | POA: Diagnosis not present

## 2021-01-23 DIAGNOSIS — Z20822 Contact with and (suspected) exposure to covid-19: Secondary | ICD-10-CM | POA: Diagnosis not present

## 2021-01-23 DIAGNOSIS — Z87891 Personal history of nicotine dependence: Secondary | ICD-10-CM | POA: Diagnosis not present

## 2021-01-23 DIAGNOSIS — I129 Hypertensive chronic kidney disease with stage 1 through stage 4 chronic kidney disease, or unspecified chronic kidney disease: Secondary | ICD-10-CM | POA: Diagnosis not present

## 2021-01-23 DIAGNOSIS — D729 Disorder of white blood cells, unspecified: Secondary | ICD-10-CM

## 2021-01-23 DIAGNOSIS — I959 Hypotension, unspecified: Secondary | ICD-10-CM | POA: Diagnosis not present

## 2021-01-23 DIAGNOSIS — D720 Genetic anomalies of leukocytes: Secondary | ICD-10-CM | POA: Diagnosis not present

## 2021-01-23 DIAGNOSIS — N189 Chronic kidney disease, unspecified: Secondary | ICD-10-CM | POA: Diagnosis not present

## 2021-01-23 DIAGNOSIS — N179 Acute kidney failure, unspecified: Secondary | ICD-10-CM | POA: Diagnosis not present

## 2021-01-23 LAB — COMPREHENSIVE METABOLIC PANEL
ALT: 15 U/L (ref 0–44)
AST: 16 U/L (ref 15–41)
Albumin: 3.5 g/dL (ref 3.5–5.0)
Alkaline Phosphatase: 55 U/L (ref 38–126)
Anion gap: 6 (ref 5–15)
BUN: 14 mg/dL (ref 6–20)
CO2: 22 mmol/L (ref 22–32)
Calcium: 8.5 mg/dL — ABNORMAL LOW (ref 8.9–10.3)
Chloride: 110 mmol/L (ref 98–111)
Creatinine, Ser: 0.96 mg/dL (ref 0.61–1.24)
GFR, Estimated: >60 mL/min (ref >60)
Glucose, Bld: 93 mg/dL (ref 70–99)
Potassium: 3.6 mmol/L (ref 3.5–5.1)
Sodium: 138 mmol/L (ref 135–145)
Total Bilirubin: 0.4 mg/dL (ref 0.3–1.2)
Total Protein: 6 g/dL — ABNORMAL LOW (ref 6.5–8.1)

## 2021-01-23 LAB — CBC
HCT: 34 % — ABNORMAL LOW (ref 39.0–52.0)
Hemoglobin: 11.4 g/dL — ABNORMAL LOW (ref 13.0–17.0)
MCH: 30.4 pg (ref 26.0–34.0)
MCHC: 33.5 g/dL (ref 30.0–36.0)
MCV: 90.7 fL (ref 80.0–100.0)
Platelets: 244 10*3/uL (ref 150–400)
RBC: 3.75 MIL/uL — ABNORMAL LOW (ref 4.22–5.81)
RDW: 15.5 % (ref 11.5–15.5)
WBC: 72.2 10*3/uL (ref 4.0–10.5)
nRBC: 0.5 % — ABNORMAL HIGH (ref 0.0–0.2)

## 2021-01-23 LAB — C-REACTIVE PROTEIN: CRP: 0.8 mg/dL (ref <1.0)

## 2021-01-23 LAB — MAGNESIUM: Magnesium: 1.8 mg/dL (ref 1.7–2.4)

## 2021-01-23 LAB — PHOSPHORUS: Phosphorus: 3.2 mg/dL (ref 2.5–4.6)

## 2021-01-23 LAB — TROPONIN I (HIGH SENSITIVITY): Troponin I (High Sensitivity): 16 ng/L (ref <18)

## 2021-01-23 NOTE — TOC Progression Note (Signed)
Transition of Care [facility]) - Progression Note    Patient Details  Name: Frank [name] MRN: [id number] Date of Birth: [birth year]  Transition of [facility]) CM/SW [place], RN Phone Number: 01/23/2021, 12:11 PM  Clinical Narrative:  Patient who is currently on virtual Substance Abuse counseling class, verbalizes girlfriend is on her way to transport him home. He also has the [employer] list that [facility] gave him yesterday.       Barriers to Discharge: Barriers Resolved  Expected Discharge Plan and Services           Expected Discharge Date: 01/23/21               DME Arranged: N/A DME Agency: NA       HH Arranged: NA HH Agency: NA         Social Determinants of Health (SDOH) Interventions    Readmission Risk Interventions No flowsheet data found.

## 2021-01-23 NOTE — Progress Notes (Signed)
Patient discharged per orders, piv and tele removed from patient. AVS reviewed with pt; expressed understanding. Taken down to main entrance via wheelchair by Consulting civil engineer.

## 2021-01-23 NOTE — Discharge Summary (Addendum)
Physician Discharge Summary  LORENZA WINKLEMAN ZOX:[id number] DOB: [birth year] DOA: 01/21/2021  PCP: System, Provider Not In  Admit date: 01/21/2021.  Discharge date: 01/23/2021  Admitted From: Home.  Disposition:  Home  Recommendations for Outpatient Follow-up:  1. [facility]. 2. Please obtain BMP/CBC in one week. 3. Patient was counseled extensively about refraining from using street drugs. 4. Patient follows up with substance abuse clinic.  Home Health: None.  Equipment/Devices: None.  Discharge Condition: Stable CODE STATUS:Full code Diet recommendation: Heart Healthy  Brief [facility] Course: This 60 yrs old African-American male with PMH significant for HTN, HLD, Alcoholic liver disease, type II DM, Chronic back pain presented to the emergency department with acute onset of syncope associated with midthoracic back pain which he describes as tightness radiating towards left shoulder and arm and also reports episodes of nausea and vomiting.  Patient was found to be hypotensive after EMS arrived,  Patient was brought in as a code STEMI however these were found to be reciprocal EKG changes. Cardiology was consulted,  recommended fluid hydration. Serial EKG and troponin,  further work-up for noncardiac causes of his chest pain. CBC shows leukocytosis of 97.1K with 21.4 immature granulocytes, 18 myelocytes, 54.4 neutrophils with 9.7 lymphocytes,  atypical mononuclear cells. Patient was admitted for syncope with orthostatic hypotension. 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  Sepsis was ruled out.  Hematology  Dr. Rogue Bussing consulted. He recommended outpatient follow-up. Patient continued on IV fluids,  all other home medications continued.  Urine drug screen positive for cocaine, opiates, TCA, benzos. Troponins trended down and remains flat. Patient denies any chest pain,  all the labs has improved. His WBC  is still remains 71,000. Patient was cleared from hematology to be discharged with outpatient follow-up. Patient was counseled extensively about refraining from street drugs. He is already enrolled in virtual substance abuse program and getting medications. Patient is being discharged home.  Discharge Diagnoses:  Active Problems:   AKI (acute kidney injury) ([place])   Neutrophilia    Discharge Instructions  Discharge Instructions    Call MD for:  difficulty breathing, headache or visual disturbances   Complete by: As directed    Call MD for:  difficulty breathing, headache or visual disturbances   Complete by: As directed    Call MD for:  persistant dizziness or light-headedness   Complete by: As directed    Call MD for:  persistant dizziness or light-headedness   Complete by: As directed    Call MD for:  persistant nausea and vomiting   Complete by: As directed    Call MD for:  persistant nausea and vomiting   Complete by: As directed    Call MD for:  severe uncontrolled pain   Complete by: As directed    Diet - low sodium heart healthy   Complete by: As directed    Diet - low sodium heart healthy   Complete by: As directed    Diet Carb Modified   Complete by: As directed    Diet Carb Modified   Complete by: As directed    Discharge instructions   Complete by: As directed    Advised to follow-up with primary care physician at [facility]. Patient was counsele extensively about refraining from using street drugs. Patient follows up with substance abuse clinic.   Discharge instructions   Complete by: As directed    Advised to follow-up with  primary care physician at [facility]. Patient was counsele extensively about refraining from using street drugs. Patient follows up with substance abuse clinic.   Increase activity slowly   Complete by: As directed    Increase activity slowly   Complete by: As directed      Allergies as of 01/23/2021   No Known  Allergies     Medication List    TAKE these medications   amLODipine 10 MG tablet Commonly known as: NORVASC Take 0.5 tablets by mouth daily.   aspirin 81 MG EC tablet Take 1 tablet by mouth daily. With a meal   Brinzolamide-Brimonidine 1-0.2 % Susp Place 1 drop into both eyes in the morning, at noon, and at bedtime.   capsaicin 0.025 % cream Commonly known as: ZOSTRIX Apply 1 application topically daily as needed. APPLY SUFFICIENT AMOUNT TOPICALLY ACTIVE EVERY DAY AVOID GETTING ON FACE, IN EYES OR ON SENSITIVE AREAS. Lamar HANDS THOROUGHLY AFTER USING.   Cholecalciferol 25 MCG (1000 UT) tablet Take 2 tablets by mouth daily.   cyclobenzaprine 10 MG tablet Commonly known as: FLEXERIL Take 10 mg by mouth 3 (three) times daily as needed for muscle spasms.   gabapentin 300 MG capsule Commonly known as: NEURONTIN Take 2 capsules by mouth in the morning, at noon, and at bedtime.   insulin aspart 100 UNIT/ML injection Commonly known as: novoLOG Inject 100 Units into the skin continuous. INJECT 100 UNITS UNDER SKIN CONTINUOUS-VIA-PUMP AS DIRECTED FOR DIABETES.DISCARD BOTTLE 28 DAYS AFTER OPENING   insulin glargine 100 UNIT/ML injection Commonly known as: LANTUS INJECT 12 UNITS UNDER SKIN EVERY DAY FOR DIABETES MELLITUS DO NOT MIX WITH OTHER INSULINS IN THE SAME SYRINGE. DISCARD BOTTLE 28 DAYS AFTER OPENING  TO REPLACE BASAL INSULIN RATE IF PUMP MALFUNCTIONS FOR DIABETES MELLITUS DO NOT MIX WITH OTHER INSULINS IN THE SAME SYRINGE. DISCARD BOTTLE 28 DAYS AFTER OPENING  TO REPLACE BASAL INSULIN RATE IF PUMP MALFUNCTIONS   latanoprost 0.005 % ophthalmic solution Commonly known as: XALATAN Place 1 drop into both eyes at bedtime.   lipase/protease/amylase 36000 UNITS Cpep capsule Commonly known as: CREON See admin instructions. TAKE 2 CAPSULES BY MOUTH WITH ACTIVE MEALS AND TAKE 1 CAPSULE WITH SNACK   lisinopril-hydrochlorothiazide 20-12.5 MG tablet Commonly known as:  ZESTORETIC Take 1 tablet by mouth daily.   nicotine 14 mg/24hr patch Commonly known as: NICODERM CQ - dosed in mg/24 hours Place 14 mg onto the skin daily. APPLY ONE PATCH TO SKIN EVERY DAY ACTIVE APPLY ONE PATCH WHEN YOU WAKE UP AND REMOVE THE OLD PATCH. PEEL THE BACK OFF THE PATCH AND PUT IT ON CLEAN,DRY, HAIR-FREE SKIN ON THE UPPER ARM, CHEST OR BACK   nicotine polacrilex 4 MG lozenge Commonly known as: COMMIT Take 4 mg by mouth as needed. DISSOLVE 1 LOZENGE BY MOUTH AS DIRECTED - USE AT LEAST 8-10 LOZENGES EACH DAY TO START. DO NOT USE MORE THAN 20 LOZENGES   omeprazole 20 MG capsule Commonly known as: PRILOSEC Take 20 mg by mouth daily before breakfast.   rosuvastatin 20 MG tablet Commonly known as: CRESTOR Take 10 mg by mouth daily.   timolol 0.5 % ophthalmic solution Commonly known as: TIMOPTIC Place 1 drop into both eyes 2 (two) times daily.       Follow-up Information    [facility] Follow [facility] in 1 week(s).        Cammie Sickle, MD Follow up.   Specialties: Internal Medicine, Oncology Contact information: [address]  [place] [address] [phone]              No Known Allergies  Consultations:  Hematology  Cardioogy   Procedures/Studies: CT Angio Chest/Abd/Pel for Dissection W and/or Wo Contrast  Result Date: 01/22/2021 CLINICAL DATA:  Syncope, hypotension, thoracic back and abdominal pain. EXAM: CT ANGIOGRAPHY CHEST, ABDOMEN AND PELVIS TECHNIQUE: Non-contrast CT of the chest was initially obtained. Multidetector CT imaging through the chest, abdomen and pelvis was performed using the standard protocol during bolus administration of intravenous contrast. Multiplanar reconstructed images and MIPs were obtained and reviewed to evaluate the vascular anatomy. CONTRAST:  1108mL OMNIPAQUE IOHEXOL 350 MG/ML SOLN COMPARISON:  05/11/2010 FINDINGS: CTA CHEST FINDINGS Cardiovascular: The thoracic aorta is normal in course and caliber.  No intramural hematoma, dissection, or aneurysm. Arch vasculature is widely patent proximally. No significant atherosclerotic plaque identified. Global cardiac size is within normal limits. Mild left ventricular hypertrophy. Minimal coronary artery calcification. No pericardial effusion. The central pulmonary arteries are of normal caliber. Mediastinum/Nodes: No enlarged mediastinal, hilar, or axillary lymph nodes. Thyroid gland, trachea, and esophagus demonstrate no significant findings. Lungs/Pleura: Mild dependent atelectasis. No superimposed focal pulmonary nodule or infiltrate. No pneumothorax or pleural effusion. Central airways are widely patent. Musculoskeletal: No acute bone abnormality. No lytic or blastic bone lesions. Review of the MIP images confirms the above findings. CTA ABDOMEN AND PELVIS FINDINGS VASCULAR Aorta: Mild mixed atherosclerotic plaque. No aneurysm or dissection. No perivascular inflammatory change. Celiac: Widely patent.  Normal anatomic configuration. SMA: Widely patent. Renals: Single renal arteries bilaterally. Widely patent. Normal vascular morphology. No aneurysm. IMA: Widely patent. Inflow: Moderate mixed atherosclerotic plaque without evidence of hemodynamically significant stenosis. No aneurysm or dissection. Internal iliac arteries are patent bilaterally. Veins: Unremarkable Review of the MIP images confirms the above findings. NON-VASCULAR Hepatobiliary: No focal liver abnormality is seen. No gallstones, gallbladder wall thickening, or biliary dilatation. Pancreas: Parenchymal calcifications are seen throughout the pancreatic parenchyma in keeping with changes of chronic pancreatitis. No peripancreatic inflammatory changes are identified. There is no pancreatic ductal dilation noted. Spleen: Unremarkable Adrenals/Urinary Tract: Adrenal glands are unremarkable. Kidneys are normal, without renal calculi, focal lesion, or hydronephrosis. Bladder is unremarkable. Stomach/Bowel:  Stomach is within normal limits. Appendix appears normal. No evidence of bowel wall thickening, distention, or inflammatory changes. No free intraperitoneal gas or fluid. Lymphatic: No pathologic adenopathy within the abdomen and pelvis. Reproductive: Prostate is unremarkable. Other: Small broad-based fat containing umbilical hernia. Musculoskeletal: No acute bone abnormality. No lytic or blastic bone lesions are identified within the abdomen and pelvis. Review of the MIP images confirms the above findings. IMPRESSION: No evidence of thoracoabdominal aortic aneurysm or dissection. Mild to moderate aortoiliac atherosclerotic calcification without evidence of hemodynamically significant stenosis. Minimal coronary artery calcification. Mild left ventricular hypertrophy. Chronic pancreatitis. No superimposed acute inflammatory changes identified. Aortic Atherosclerosis (ICD10-I70.0). Electronically Signed   By: Fidela Salisbury MD   On: 01/22/2021 00:21      Subjective: Patient was seen and examined at bedside.  Overnight events noted.   Patient reports chest pain has resolved and patient want to be discharged. Patient cleared from hematology to be discharged.  Discharge Exam: Vitals:   01/23/21 0723 01/23/21 1209  BP: (!) 141/75 (!) 145/78  Pulse: 70 65  Resp: 15 15  Temp: 98.5 F (36.9 C) 98.7 F (37.1 C)  SpO2: 100% 100%   Vitals:   01/22/21 2056 01/23/21 0035 01/23/21 0723 01/23/21 1209  BP: 129/72  (!) 141/75 (!) 145/78  Pulse:  70  70 65  Resp: 18  15 15   Temp: 98.5 F (36.9 C)  98.5 F (36.9 C) 98.7 F (37.1 C)  TempSrc: Oral  Oral Oral  SpO2: 100%  100% 100%  Weight:  83.7 kg    Height:        General: Pt is alert, awake, not in acute distress Cardiovascular: RRR, S1/S2 +, no rubs, no gallops Respiratory: CTA bilaterally, no wheezing, no rhonchi Abdominal: Soft, NT, ND, bowel sounds + Extremities: no edema, no cyanosis    The results of significant diagnostics from this  hospitalization (including imaging, microbiology, ancillary and laboratory) are listed below for reference.     Microbiology: Recent Results (from the past 240 hour(s))  Resp Panel by RT-PCR (Flu A&B, Covid) Nasopharyngeal Swab     Status: None   Collection Time: 01/21/21 10:55 PM   Specimen: Nasopharyngeal Swab; Nasopharyngeal(NP) swabs in vial transport medium  Result Value Ref Range Status   SARS Coronavirus 2 by RT PCR NEGATIVE NEGATIVE Final    Comment: (NOTE) SARS-CoV-2 target nucleic acids are NOT DETECTED.  The SARS-CoV-2 RNA is generally detectable in upper respiratory specimens during the acute phase of infection. The lowest concentration of SARS-CoV-2 viral copies this assay can detect is 138 copies/mL. A negative result does not preclude SARS-Cov-2 infection and should not be used as the sole basis for treatment or other patient management decisions. A negative result may occur with  improper specimen collection/handling, submission of specimen other than nasopharyngeal swab, presence of viral mutation(s) within the areas targeted by this assay, and inadequate number of viral copies(<138 copies/mL). A negative result must be combined with clinical observations, patient history, and epidemiological information. The expected result is Negative.  Fact Sheet for Patients:  EntrepreneurPulse.com.au  Fact Sheet for Healthcare Providers:  IncredibleEmployment.be  This test is no t yet approved or cleared by the [place] FDA and  has been authorized for detection and/or diagnosis of SARS-CoV-2 by FDA under an Emergency Use Authorization (EUA). This EUA will remain  in effect (meaning this test can be used) for the duration of the COVID-19 declaration under Section 564(b)(1) of the Act, 21 U.S.C.section 360bbb-3(b)(1), unless the authorization is terminated  or revoked sooner.       Influenza A by PCR NEGATIVE NEGATIVE Final    Influenza B by PCR NEGATIVE NEGATIVE Final    Comment: (NOTE) The Xpert Xpress SARS-CoV-2/FLU/RSV plus assay is intended as an aid in the diagnosis of influenza from Nasopharyngeal swab specimens and should not be used as a sole basis for treatment. Nasal washings and aspirates are unacceptable for Xpert Xpress SARS-CoV-2/FLU/RSV testing.  Fact Sheet for Patients: EntrepreneurPulse.com.au  Fact Sheet for Healthcare Providers: IncredibleEmployment.be  This test is not yet approved or cleared by the [place] FDA and has been authorized for detection and/or diagnosis of SARS-CoV-2 by FDA under an Emergency Use Authorization (EUA). This EUA will remain in effect (meaning this test can be used) for the duration of the COVID-19 declaration under Section 564(b)(1) of the Act, 21 U.S.C. section 360bbb-3(b)(1), unless the authorization is terminated or revoked.  Performed at [facility], [address] [place], [place] [address]   Blood Culture (routine x 2)     Status: None (Preliminary result)   Collection Time: 01/22/21  6:29 AM   Specimen: BLOOD  Result Value Ref Range Status   Specimen Description BLOOD RIGHT [facility]  Final   Special Requests   Final    BOTTLES  DRAWN AEROBIC AND ANAEROBIC Blood Culture adequate volume   Culture   Final    NO GROWTH < 24 HOURS Performed at [facility], [address] [place],  [address]    Report Status PENDING  Incomplete     Labs: BNP (last 3 results) No results for input(s): BNP in the last 8760 hours. Basic Metabolic Panel: Recent Labs  Lab 01/21/21 2255 01/22/21 0629 01/23/21 0445  NA 135 134* 138  K 5.1 4.3 3.6  CL 107 105 110  CO2 21* 20* 22  GLUCOSE 186* 240* 93  BUN 22* 18 14  CREATININE 1.89* 1.29* 0.96  CALCIUM 9.3 8.7* 8.5*  MG  --   --  1.8  PHOS  --   --  3.2   Liver Function Tests: Recent Labs  Lab 01/21/21 2255 01/23/21 0445  AST 32 16  ALT 20  15  ALKPHOS 66 55  BILITOT 0.7 0.4  PROT 7.1 6.0*  ALBUMIN 4.0 3.5   No results for input(s): LIPASE, AMYLASE in the last 168 hours. No results for input(s): AMMONIA in the last 168 hours. CBC: Recent Labs  Lab 01/21/21 2255 01/22/21 0629 01/23/21 0445  WBC 97.1* 84.7* 72.2*  NEUTROABS 54.4*  --   --   HGB 12.6* 11.4* 11.4*  HCT 39.4 34.7* 34.0*  MCV 93.6 92.0 90.7  PLT 316 280 244   Cardiac Enzymes: No results for input(s): CKTOTAL, CKMB, CKMBINDEX, TROPONINI in the last 168 hours. BNP: Invalid input(s): POCBNP CBG: Recent Labs  Lab 01/21/21 2258  GLUCAP 164*   D-Dimer No results for input(s): DDIMER in the last 72 hours. Hgb A1c No results for input(s): HGBA1C in the last 72 hours. Lipid Profile No results for input(s): CHOL, HDL, LDLCALC, TRIG, CHOLHDL, LDLDIRECT in the last 72 hours. Thyroid function studies No results for input(s): TSH, T4TOTAL, T3FREE, THYROIDAB in the last 72 hours.  Invalid input(s): FREET3 Anemia work up No results for input(s): VITAMINB12, FOLATE, FERRITIN, TIBC, IRON, RETICCTPCT in the last 72 hours. Urinalysis    Component Value Date/Time   COLORURINE YELLOW (A) 01/22/2021 0010   APPEARANCEUR CLEAR (A) 01/22/2021 0010   APPEARANCEUR Clear 09/13/2014 1758   LABSPEC 1.036 (H) 01/22/2021 0010   LABSPEC 1.018 09/13/2014 1758   PHURINE 5.0 01/22/2021 0010   GLUCOSEU 50 (A) 01/22/2021 0010   GLUCOSEU Negative 09/13/2014 1758   HGBUR NEGATIVE 01/22/2021 0010   BILIRUBINUR NEGATIVE 01/22/2021 0010   BILIRUBINUR Negative 09/13/2014 1758   KETONESUR NEGATIVE 01/22/2021 0010   PROTEINUR 30 (A) 01/22/2021 0010   NITRITE NEGATIVE 01/22/2021 0010   LEUKOCYTESUR NEGATIVE 01/22/2021 0010   LEUKOCYTESUR Negative 09/13/2014 1758   Sepsis Labs Invalid input(s): PROCALCITONIN,  WBC,  LACTICIDVEN Microbiology Recent Results (from the past 240 hour(s))  Resp Panel by RT-PCR (Flu A&B, Covid) Nasopharyngeal Swab     Status: None   Collection  Time: 01/21/21 10:55 PM   Specimen: Nasopharyngeal Swab; Nasopharyngeal(NP) swabs in vial transport medium  Result Value Ref Range Status   SARS Coronavirus 2 by RT PCR NEGATIVE NEGATIVE Final    Comment: (NOTE) SARS-CoV-2 target nucleic acids are NOT DETECTED.  The SARS-CoV-2 RNA is generally detectable in upper respiratory specimens during the acute phase of infection. The lowest concentration of SARS-CoV-2 viral copies this assay can detect is 138 copies/mL. A negative result does not preclude SARS-Cov-2 infection and should not be used as the sole basis for treatment or other patient management decisions. A negative result may occur  with  improper specimen collection/handling, submission of specimen other than nasopharyngeal swab, presence of viral mutation(s) within the areas targeted by this assay, and inadequate number of viral copies(<138 copies/mL). A negative result must be combined with clinical observations, patient history, and epidemiological information. The expected result is Negative.  Fact Sheet for Patients:  EntrepreneurPulse.com.au  Fact Sheet for Healthcare Providers:  IncredibleEmployment.be  This test is no t yet approved or cleared by the [place] FDA and  has been authorized for detection and/or diagnosis of SARS-CoV-2 by FDA under an Emergency Use Authorization (EUA). This EUA will remain  in effect (meaning this test can be used) for the duration of the COVID-19 declaration under Section 564(b)(1) of the Act, 21 U.S.C.section 360bbb-3(b)(1), unless the authorization is terminated  or revoked sooner.       Influenza A by PCR NEGATIVE NEGATIVE Final   Influenza B by PCR NEGATIVE NEGATIVE Final    Comment: (NOTE) The Xpert Xpress SARS-CoV-2/FLU/RSV plus assay is intended as an aid in the diagnosis of influenza from Nasopharyngeal swab specimens and should not be used as a sole basis for treatment. Nasal washings  and aspirates are unacceptable for Xpert Xpress SARS-CoV-2/FLU/RSV testing.  Fact Sheet for Patients: EntrepreneurPulse.com.au  Fact Sheet for Healthcare Providers: IncredibleEmployment.be  This test is not yet approved or cleared by the [place] FDA and has been authorized for detection and/or diagnosis of SARS-CoV-2 by FDA under an Emergency Use Authorization (EUA). This EUA will remain in effect (meaning this test can be used) for the duration of the COVID-19 declaration under Section 564(b)(1) of the Act, 21 U.S.C. section 360bbb-3(b)(1), unless the authorization is terminated or revoked.  Performed at [facility], [address] [place], [place] [address]   Blood Culture (routine x 2)     Status: None (Preliminary result)   Collection Time: 01/22/21  6:29 AM   Specimen: BLOOD  Result Value Ref Range Status   Specimen Description BLOOD RIGHT [facility]  Final   Special Requests   Final    BOTTLES DRAWN AEROBIC AND ANAEROBIC Blood Culture adequate volume   Culture   Final    NO GROWTH < 24 HOURS Performed at [facility], [address] [place], [place] [address]    Report Status PENDING  Incomplete     Time coordinating discharge: Over 30 minutes  SIGNED:   Shawna Clamp, MD  [facility] 01/23/2021, 12:36 PM Pager   If 7PM-7AM, please contact night-coverage [email or web address]

## 2021-01-23 NOTE — Discharge Instructions (Signed)
Advised to follow-up with primary care physician at [facility]. Patient was counsele extensively about refraining from using street drugs. Patient follows up with substance abuse clinic.

## 2021-01-23 NOTE — Plan of Care (Signed)

## 2021-01-24 ENCOUNTER — Telehealth: Payer: Self-pay | Admitting: Internal Medicine

## 2021-01-24 DIAGNOSIS — N179 Acute kidney failure, unspecified: Secondary | ICD-10-CM

## 2021-01-24 DIAGNOSIS — D729 Disorder of white blood cells, unspecified: Secondary | ICD-10-CM

## 2021-01-24 NOTE — Telephone Encounter (Signed)
Melissa-new patient hospital follow-up-schedule follow-up 1 week to 10 days-MD; labs CBC CMP [facility].  Thanks GB

## 2021-01-24 NOTE — Addendum Note (Signed)
Addended by: Delice Bison E on: 01/24/2021 09:17 AM   Modules accepted: Orders

## 2021-01-27 LAB — CULTURE, BLOOD (ROUTINE X 2)
Culture: NO GROWTH
Special Requests: ADEQUATE

## 2021-01-29 LAB — BCR-ABL1 FISH
Cells Analyzed: 100
Cells Counted: 100

## 2021-01-31 ENCOUNTER — Inpatient Hospital Stay: Payer: Medicare Other

## 2021-01-31 ENCOUNTER — Other Ambulatory Visit: Payer: Self-pay

## 2021-01-31 ENCOUNTER — Encounter: Payer: Self-pay | Admitting: Internal Medicine

## 2021-01-31 ENCOUNTER — Inpatient Hospital Stay: Payer: Medicare Other | Attending: Internal Medicine | Admitting: Internal Medicine

## 2021-01-31 DIAGNOSIS — E1165 Type 2 diabetes mellitus with hyperglycemia: Secondary | ICD-10-CM | POA: Insufficient documentation

## 2021-01-31 DIAGNOSIS — F1410 Cocaine abuse, uncomplicated: Secondary | ICD-10-CM | POA: Diagnosis not present

## 2021-01-31 DIAGNOSIS — F1290 Cannabis use, unspecified, uncomplicated: Secondary | ICD-10-CM | POA: Insufficient documentation

## 2021-01-31 DIAGNOSIS — E1122 Type 2 diabetes mellitus with diabetic chronic kidney disease: Secondary | ICD-10-CM | POA: Insufficient documentation

## 2021-01-31 DIAGNOSIS — I10 Essential (primary) hypertension: Secondary | ICD-10-CM | POA: Insufficient documentation

## 2021-01-31 DIAGNOSIS — M549 Dorsalgia, unspecified: Secondary | ICD-10-CM | POA: Diagnosis not present

## 2021-01-31 DIAGNOSIS — F17210 Nicotine dependence, cigarettes, uncomplicated: Secondary | ICD-10-CM | POA: Insufficient documentation

## 2021-01-31 DIAGNOSIS — N179 Acute kidney failure, unspecified: Secondary | ICD-10-CM

## 2021-01-31 DIAGNOSIS — K709 Alcoholic liver disease, unspecified: Secondary | ICD-10-CM | POA: Insufficient documentation

## 2021-01-31 DIAGNOSIS — C9210 Chronic myeloid leukemia, BCR/ABL-positive, not having achieved remission: Secondary | ICD-10-CM | POA: Insufficient documentation

## 2021-01-31 DIAGNOSIS — E785 Hyperlipidemia, unspecified: Secondary | ICD-10-CM | POA: Insufficient documentation

## 2021-01-31 DIAGNOSIS — R0789 Other chest pain: Secondary | ICD-10-CM | POA: Diagnosis not present

## 2021-01-31 DIAGNOSIS — G8929 Other chronic pain: Secondary | ICD-10-CM | POA: Insufficient documentation

## 2021-01-31 DIAGNOSIS — IMO0002 Reserved for concepts with insufficient information to code with codable children: Secondary | ICD-10-CM | POA: Insufficient documentation

## 2021-01-31 DIAGNOSIS — N189 Chronic kidney disease, unspecified: Secondary | ICD-10-CM | POA: Insufficient documentation

## 2021-01-31 DIAGNOSIS — D729 Disorder of white blood cells, unspecified: Secondary | ICD-10-CM

## 2021-01-31 DIAGNOSIS — E1065 Type 1 diabetes mellitus with hyperglycemia: Secondary | ICD-10-CM | POA: Insufficient documentation

## 2021-01-31 LAB — CBC WITH DIFFERENTIAL/PLATELET
Abs Immature Granulocytes: 4.1 10*3/uL — ABNORMAL HIGH (ref 0.00–0.07)
Band Neutrophils: 5 %
Basophils Absolute: 0.3 10*3/uL — ABNORMAL HIGH (ref 0.0–0.1)
Basophils Relative: 1 %
Eosinophils Absolute: 0.7 10*3/uL — ABNORMAL HIGH (ref 0.0–0.5)
Eosinophils Relative: 2 %
HCT: 38.6 % — ABNORMAL LOW (ref 39.0–52.0)
Hemoglobin: 12.6 g/dL — ABNORMAL LOW (ref 13.0–17.0)
Lymphocytes Relative: 17 %
Lymphs Abs: 5.8 10*3/uL — ABNORMAL HIGH (ref 0.7–4.0)
MCH: 29.7 pg (ref 26.0–34.0)
MCHC: 32.6 g/dL (ref 30.0–36.0)
MCV: 91 fL (ref 80.0–100.0)
Metamyelocytes Relative: 5 %
Monocytes Absolute: 0.3 10*3/uL (ref 0.1–1.0)
Monocytes Relative: 1 %
Myelocytes: 6 %
Neutro Abs: 23 10*3/uL — ABNORMAL HIGH (ref 1.7–7.7)
Neutrophils Relative %: 62 %
Platelets: 288 10*3/uL (ref 150–400)
Promyelocytes Relative: 1 %
RBC: 4.24 MIL/uL (ref 4.22–5.81)
RDW: 15.1 % (ref 11.5–15.5)
Smear Review: NORMAL
WBC: 34.4 10*3/uL — ABNORMAL HIGH (ref 4.0–10.5)
nRBC: 3.9 % — ABNORMAL HIGH (ref 0.0–0.2)

## 2021-01-31 LAB — COMPREHENSIVE METABOLIC PANEL
ALT: 16 U/L (ref 0–44)
AST: 18 U/L (ref 15–41)
Albumin: 4.3 g/dL (ref 3.5–5.0)
Alkaline Phosphatase: 66 U/L (ref 38–126)
Anion gap: 9 (ref 5–15)
BUN: 22 mg/dL — ABNORMAL HIGH (ref 6–20)
CO2: 23 mmol/L (ref 22–32)
Calcium: 9.3 mg/dL (ref 8.9–10.3)
Chloride: 105 mmol/L (ref 98–111)
Creatinine, Ser: 1.22 mg/dL (ref 0.61–1.24)
GFR, Estimated: >60 mL/min (ref >60)
Glucose, Bld: 153 mg/dL — ABNORMAL HIGH (ref 70–99)
Potassium: 4.3 mmol/L (ref 3.5–5.1)
Sodium: 137 mmol/L (ref 135–145)
Total Bilirubin: 0.5 mg/dL (ref 0.3–1.2)
Total Protein: 7.6 g/dL (ref 6.5–8.1)

## 2021-01-31 LAB — LACTATE DEHYDROGENASE: LDH: 332 U/L — ABNORMAL HIGH (ref 98–192)

## 2021-01-31 NOTE — Assessment & Plan Note (Addendum)
#  Chronic myeloid leukemia-appears chronic based on blood work/symptomatology.  FISH testing positive for BCR ABL.  Recommend a bone marrow biopsy to rule out any accelerated/blast process.  #Recommend a bone marrow biopsy; BCR ABL RT-PCR for further evaluation.  Discussed with patient the treatment options include usually TKIs pills-dasatinib/Gleevec [avoid nilotinib secondary to poorly controlled diabetes]  Discussed with the patient the bone marrow biopsy and aspiration indication and procedure at length.  Given significant discomfort involved-I would recommend under anesthesia/with radiology in the hospital. I discussed the potential complications include-bleeding/trauma and risk of infection; which are fortunately very rare.  Patient is in agreement. Patient will sign the consent prior to the procedure. Bone marrow biopsy/aspiration is ordered.   #[facility]  Stable  #Substance drug abuse-March 2022-positive for cocaine [[facility]]-counseled the patient to quit illicit drugs.   # DISPOSITION: # labs to today # bone marrow asap # follow up in 2 weeks; No labs- - Dr.B

## 2021-01-31 NOTE — Progress Notes (Signed)
[facility] CONSULT NOTE  Patient Care Team: System, Provider Not In as PCP - General  CHIEF COMPLAINTS/PURPOSE OF CONSULTATION: Neutrophilia/leukocytosis  #  Oncology History Overview Note  Result Comment:   Comment: (NOTE)  ABNORMAL: 89% OF NUCLEI POSITIVE FOR BCR/ABL1 GENE FUSION  SIGNAL(S)        Chronic myeloid leukemia ([place])  01/31/2021 Initial Diagnosis   Chronic myeloid leukemia ([place])      HISTORY OF PRESENTING ILLNESS:  Frank [name] 60 y.o.  male hypertension alcoholic liver disease type 2 diabetes on insulin poorly controlled chronic back pain was recently admitted to hospital for acute syncope/chest pain.  Incidentally patient noted to have elevated white count neutrophilia 70-80k.  Patient drug screen was also positive for cocaine opiates.  Work-up from cardiology standpoint-was unremarkable.  Patient is here for follow-up-to review the results of the work-up ordered from hematology standpoint   Review of Systems  Constitutional: Positive for malaise/fatigue. Negative for chills, diaphoresis, fever and weight loss.  HENT: Negative for nosebleeds and sore throat.   Eyes: Negative for double vision.  Respiratory: Negative for cough, hemoptysis, sputum production, shortness of breath and wheezing.   Cardiovascular: Negative for chest pain, palpitations, orthopnea and leg swelling.  Gastrointestinal: Negative for abdominal pain, blood in stool, constipation, diarrhea, heartburn, melena, nausea and vomiting.  Genitourinary: Negative for dysuria, frequency and urgency.  Musculoskeletal: Positive for back pain and joint pain.  Skin: Negative.  Negative for itching and rash.  Neurological: Negative for dizziness, tingling, focal weakness, weakness and headaches.  Endo/Heme/Allergies: Does not bruise/bleed easily.  Psychiatric/Behavioral: Negative for depression. The patient is not nervous/anxious and does not have insomnia.      MEDICAL HISTORY:  Past  Medical History:  Diagnosis Date  . Arthritis   . Chronic low back pain with sciatica   . CKD (chronic kidney disease)    unknown stage  . Diabetes mellitus without complication (HCC)    insulin pump therapy  . Hemorrhoids   . History of pancreatitis   . Hyperlipidemia   . Hypertension   . 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 No pertinent surgical history.  SOCIAL HISTORY: Social History   Socioeconomic History  . Marital status: Single    Spouse name: Not on file  . Number of children: Not on file  . Years of education: Not on file  . Highest education level: Not on file  Occupational History  . Not on file  Tobacco Use  . Smoking status: Former Research scientist (life sciences)  . Smokeless tobacco: Never Used  Substance and Sexual Activity  . Alcohol use: Yes    Comment: hx of ETOH abuse  . Drug use: Yes    Types: Cocaine, Marijuana  . Sexual activity: Not on file  Other Topics Concern  . Not on file  Social History Narrative  . Not on file   Social Determinants of Health   Financial Resource Strain: Not on file  Food Insecurity: Not on file  Transportation Needs: Not on file  Physical Activity: Not on file  Stress: Not on file  Social Connections: Not on file  Intimate Partner Violence: Not on file    FAMILY HISTORY: Family History  Problem Relation Age of Onset  . Diabetes Mother   . Hypertension Mother   . Heart failure Mother   . Hyperlipidemia Mother   . Thyroid disease Mother   . Alcohol abuse Father   . Emphysema Father   .  Kidney failure Father   . Gout Father   . Cancer Sister   . Hyperlipidemia Sister   . Hypertension Sister   . Rheum arthritis Sister   . Fibromyalgia Sister   . Thyroid disease Sister     ALLERGIES:  has No Known Allergies.  MEDICATIONS:  Current Outpatient Medications  Medication Sig Dispense Refill  . amLODipine (NORVASC) 10 MG tablet Take 0.5 tablets by mouth daily.    Marland Kitchen aspirin 81 MG EC tablet  Take 1 tablet by mouth daily. With a meal    . Brinzolamide-Brimonidine 1-0.2 % SUSP Place 1 drop into both eyes in the morning, at noon, and at bedtime.    . capsaicin (ZOSTRIX) 0.025 % cream Apply 1 application topically daily as needed. APPLY SUFFICIENT AMOUNT TOPICALLY ACTIVE EVERY DAY AVOID GETTING ON FACE, IN EYES OR ON SENSITIVE AREAS. [place] HANDS THOROUGHLY AFTER USING.    Marland Kitchen Cholecalciferol 25 MCG (1000 UT) tablet Take 2 tablets by mouth daily.    . cyclobenzaprine (FLEXERIL) 10 MG tablet Take 10 mg by mouth 3 (three) times daily as needed for muscle spasms.    Marland Kitchen gabapentin (NEURONTIN) 300 MG capsule Take 2 capsules by mouth in the morning, at noon, and at bedtime.    . insulin aspart (NOVOLOG) 100 UNIT/ML injection Inject 100 Units into the skin continuous. INJECT 100 UNITS UNDER SKIN CONTINUOUS-VIA-PUMP AS DIRECTED FOR DIABETES.DISCARD BOTTLE 28 DAYS AFTER OPENING    . insulin glargine (LANTUS) 100 UNIT/ML injection INJECT 12 UNITS UNDER SKIN EVERY DAY FOR DIABETES MELLITUS DO NOT MIX WITH OTHER INSULINS IN THE SAME SYRINGE. DISCARD BOTTLE 28 DAYS AFTER OPENING  TO REPLACE BASAL INSULIN RATE IF PUMP MALFUNCTIONS FOR DIABETES MELLITUS DO NOT MIX WITH OTHER INSULINS IN THE SAME SYRINGE. DISCARD BOTTLE 28 DAYS AFTER OPENING  TO REPLACE BASAL INSULIN RATE IF PUMP MALFUNCTIONS    . latanoprost (XALATAN) 0.005 % ophthalmic solution Place 1 drop into both eyes at bedtime.    . lipase/protease/amylase (CREON) 36000 UNITS CPEP capsule See admin instructions. TAKE 2 CAPSULES BY MOUTH WITH ACTIVE MEALS AND TAKE 1 CAPSULE WITH SNACK    . lisinopril-hydrochlorothiazide (ZESTORETIC) 20-12.5 MG tablet Take 1 tablet by mouth daily.    . nicotine (NICODERM CQ - DOSED IN MG/24 HOURS) 14 mg/24hr patch Place 14 mg onto the skin daily. APPLY ONE PATCH TO SKIN EVERY DAY ACTIVE APPLY ONE PATCH WHEN YOU WAKE UP AND REMOVE THE OLD PATCH. PEEL THE BACK OFF THE PATCH AND PUT IT ON CLEAN,DRY, HAIR-FREE SKIN ON THE  UPPER ARM, CHEST OR BACK    . nicotine polacrilex (COMMIT) 4 MG lozenge Take 4 mg by mouth as needed. DISSOLVE 1 LOZENGE BY MOUTH AS DIRECTED - USE AT LEAST 8-10 LOZENGES EACH DAY TO START. DO NOT USE MORE THAN 20 LOZENGES    . omeprazole (PRILOSEC) 20 MG capsule Take 20 mg by mouth daily before breakfast.    . rosuvastatin (CRESTOR) 20 MG tablet Take 10 mg by mouth daily.    . timolol (TIMOPTIC) 0.5 % ophthalmic solution Place 1 drop into both eyes 2 (two) times daily.     No current facility-administered medications for this visit.      Marland Kitchen  PHYSICAL EXAMINATION: ECOG PERFORMANCE STATUS: 0 - Asymptomatic  Vitals:   01/31/21 1354  BP: (!) 142/87  Pulse: 77  Temp: 98.8 F (37.1 C)  SpO2: 100%   Filed Weights   01/31/21 1354  Weight: 182 lb 6 oz (82.7 kg)  Physical Exam Constitutional:      Comments: Patient ambulating independently.  He is accompanied by sister  HENT:     Head: Normocephalic and atraumatic.     Mouth/Throat:     Mouth: Oropharynx is clear and moist.     Pharynx: No oropharyngeal exudate.  Eyes:     Pupils: Pupils are equal, round, and reactive to light.  Cardiovascular:     Rate and Rhythm: Normal rate and regular rhythm.  Pulmonary:     Effort: No respiratory distress.     Breath sounds: No wheezing.     Comments: Decreased air entry bilaterally. Abdominal:     General: Bowel sounds are normal. There is no distension.     Palpations: Abdomen is soft. There is no mass.     Tenderness: There is no abdominal tenderness. There is no guarding or rebound.  Musculoskeletal:        General: No tenderness or edema. Normal range of motion.     Cervical back: Normal range of motion and neck supple.  Skin:    General: Skin is warm.  Neurological:     Mental Status: He is alert and oriented to person, place, and time.  Psychiatric:        Mood and Affect: Affect normal.      LABORATORY DATA:  I have reviewed the data as listed Lab Results   Component Value Date   WBC 34.4 (H) 01/31/2021   HGB 12.6 (L) 01/31/2021   HCT 38.6 (L) 01/31/2021   MCV 91.0 01/31/2021   PLT 288 01/31/2021   Recent Labs    01/21/21 2255 01/22/21 0629 01/23/21 0445 01/31/21 1436  NA 135 134* 138 137  K 5.1 4.3 3.6 4.3  CL 107 105 110 105  CO2 21* 20* 22 23  GLUCOSE 186* 240* 93 153*  BUN 22* 18 14 22*  CREATININE 1.89* 1.29* 0.96 1.22  CALCIUM 9.3 8.7* 8.5* 9.3  GFRNONAA 40* >60 >60 >60  PROT 7.1  --  6.0* 7.6  ALBUMIN 4.0  --  3.5 4.3  AST 32  --  16 18  ALT 20  --  15 16  ALKPHOS 66  --  55 66  BILITOT 0.7  --  0.4 0.5    RADIOGRAPHIC STUDIES: I have personally reviewed the radiological images as listed and agreed with the findings in the report. CT Angio Chest/Abd/Pel for Dissection W and/or Wo Contrast  Result Date: 01/22/2021 CLINICAL DATA:  Syncope, hypotension, thoracic back and abdominal pain. EXAM: CT ANGIOGRAPHY CHEST, ABDOMEN AND PELVIS TECHNIQUE: Non-contrast CT of the chest was initially obtained. Multidetector CT imaging through the chest, abdomen and pelvis was performed using the standard protocol during bolus administration of intravenous contrast. Multiplanar reconstructed images and MIPs were obtained and reviewed to evaluate the vascular anatomy. CONTRAST:  164m OMNIPAQUE IOHEXOL 350 MG/ML SOLN COMPARISON:  05/11/2010 FINDINGS: CTA CHEST FINDINGS Cardiovascular: The thoracic aorta is normal in course and caliber. No intramural hematoma, dissection, or aneurysm. Arch vasculature is widely patent proximally. No significant atherosclerotic plaque identified. Global cardiac size is within normal limits. Mild left ventricular hypertrophy. Minimal coronary artery calcification. No pericardial effusion. The central pulmonary arteries are of normal caliber. Mediastinum/Nodes: No enlarged mediastinal, hilar, or axillary lymph nodes. Thyroid gland, trachea, and esophagus demonstrate no significant findings. Lungs/Pleura: Mild  dependent atelectasis. No superimposed focal pulmonary nodule or infiltrate. No pneumothorax or pleural effusion. Central airways are widely patent. Musculoskeletal: No acute bone abnormality. No lytic or  blastic bone lesions. Review of the MIP images confirms the above findings. CTA ABDOMEN AND PELVIS FINDINGS VASCULAR Aorta: Mild mixed atherosclerotic plaque. No aneurysm or dissection. No perivascular inflammatory change. Celiac: Widely patent.  Normal anatomic configuration. SMA: Widely patent. Renals: Single renal arteries bilaterally. Widely patent. Normal vascular morphology. No aneurysm. IMA: Widely patent. Inflow: Moderate mixed atherosclerotic plaque without evidence of hemodynamically significant stenosis. No aneurysm or dissection. Internal iliac arteries are patent bilaterally. Veins: Unremarkable Review of the MIP images confirms the above findings. NON-VASCULAR Hepatobiliary: No focal liver abnormality is seen. No gallstones, gallbladder wall thickening, or biliary dilatation. Pancreas: Parenchymal calcifications are seen throughout the pancreatic parenchyma in keeping with changes of chronic pancreatitis. No peripancreatic inflammatory changes are identified. There is no pancreatic ductal dilation noted. Spleen: Unremarkable Adrenals/Urinary Tract: Adrenal glands are unremarkable. Kidneys are normal, without renal calculi, focal lesion, or hydronephrosis. Bladder is unremarkable. Stomach/Bowel: Stomach is within normal limits. Appendix appears normal. No evidence of bowel wall thickening, distention, or inflammatory changes. No free intraperitoneal gas or fluid. Lymphatic: No pathologic adenopathy within the abdomen and pelvis. Reproductive: Prostate is unremarkable. Other: Small broad-based fat containing umbilical hernia. Musculoskeletal: No acute bone abnormality. No lytic or blastic bone lesions are identified within the abdomen and pelvis. Review of the MIP images confirms the above findings.  IMPRESSION: No evidence of thoracoabdominal aortic aneurysm or dissection. Mild to moderate aortoiliac atherosclerotic calcification without evidence of hemodynamically significant stenosis. Minimal coronary artery calcification. Mild left ventricular hypertrophy. Chronic pancreatitis. No superimposed acute inflammatory changes identified. Aortic Atherosclerosis (ICD10-I70.0). Electronically Signed   By: Fidela Salisbury MD   On: 01/22/2021 00:21    ASSESSMENT & PLAN:   Chronic myeloid leukemia ([place]) #Chronic myeloid leukemia-appears chronic based on blood work/symptomatology.  FISH testing positive for BCR ABL.  Recommend a bone marrow biopsy to rule out any accelerated/blast process.  #Recommend a bone marrow biopsy; BCR ABL RT-PCR for further evaluation.  Discussed with patient the treatment options include usually TKIs pills-dasatinib/Gleevec [avoid nilotinib secondary to poorly controlled diabetes]  Discussed with the patient the bone marrow biopsy and aspiration indication and procedure at length.  Given significant discomfort involved-I would recommend under anesthesia/with radiology in the hospital. I discussed the potential complications include-bleeding/trauma and risk of infection; which are fortunately very rare.  Patient is in agreement. Patient will sign the consent prior to the procedure. Bone marrow biopsy/aspiration is ordered.   #[facility]  Stable  #Substance drug abuse-March 2022-positive for cocaine [[facility]]-counseled the patient to quit illicit drugs.   # DISPOSITION: # labs to today # bone marrow asap # follow up in 2 weeks; No labs- - Dr.B  All questions were answered. The patient knows to call the clinic with any problems, questions or concerns.     Cammie Sickle, MD 01/31/2021 3:37 PM

## 2021-02-01 ENCOUNTER — Telehealth: Payer: Self-pay | Admitting: *Deleted

## 2021-02-01 NOTE — Telephone Encounter (Signed)
Bone marrow biopsy set up for 02/05/21. Arrival time at 830 am for a 930 am apt [facility] the [facility] mall. Patient instructed not to eat/drink anything after midnight (6-8 hrs prior the apt).  Follow-up apt given 02/19/21 at 10 am with Dr. Rogue Bussing

## 2021-02-02 ENCOUNTER — Other Ambulatory Visit: Payer: Self-pay | Admitting: Student

## 2021-02-02 NOTE — Progress Notes (Signed)
Patient on schedule for BMB 02/05/2021. Called and spoke with patient, made aware to  Be here @ 0830, NPO after MN prior to procedure and driver for discharge post procedure/recovery. Stated understanding.

## 2021-02-05 ENCOUNTER — Ambulatory Visit: Payer: Medicare Other

## 2021-02-05 ENCOUNTER — Other Ambulatory Visit: Payer: Self-pay

## 2021-02-05 ENCOUNTER — Ambulatory Visit
Admission: RE | Admit: 2021-02-05 | Discharge: 2021-02-05 | Disposition: A | Discharge status: Home / Self Care | Payer: Medicare Other | Source: Ambulatory Visit | Attending: Internal Medicine | Admitting: Internal Medicine

## 2021-02-05 DIAGNOSIS — E1122 Type 2 diabetes mellitus with diabetic chronic kidney disease: Secondary | ICD-10-CM | POA: Insufficient documentation

## 2021-02-05 DIAGNOSIS — Z809 Family history of malignant neoplasm, unspecified: Secondary | ICD-10-CM | POA: Insufficient documentation

## 2021-02-05 DIAGNOSIS — I129 Hypertensive chronic kidney disease with stage 1 through stage 4 chronic kidney disease, or unspecified chronic kidney disease: Secondary | ICD-10-CM | POA: Insufficient documentation

## 2021-02-05 DIAGNOSIS — C9210 Chronic myeloid leukemia, BCR/ABL-positive, not having achieved remission: Secondary | ICD-10-CM

## 2021-02-05 DIAGNOSIS — Z7982 Long term (current) use of aspirin: Secondary | ICD-10-CM | POA: Insufficient documentation

## 2021-02-05 DIAGNOSIS — N189 Chronic kidney disease, unspecified: Secondary | ICD-10-CM | POA: Insufficient documentation

## 2021-02-05 DIAGNOSIS — Z79899 Other long term (current) drug therapy: Secondary | ICD-10-CM | POA: Diagnosis not present

## 2021-02-05 DIAGNOSIS — Z8249 Family history of ischemic heart disease and other diseases of the circulatory system: Secondary | ICD-10-CM | POA: Insufficient documentation

## 2021-02-05 DIAGNOSIS — D7219 Other eosinophilia: Secondary | ICD-10-CM | POA: Diagnosis not present

## 2021-02-05 DIAGNOSIS — K7030 Alcoholic cirrhosis of liver without ascites: Secondary | ICD-10-CM | POA: Diagnosis not present

## 2021-02-05 DIAGNOSIS — D649 Anemia, unspecified: Secondary | ICD-10-CM | POA: Insufficient documentation

## 2021-02-05 DIAGNOSIS — F17210 Nicotine dependence, cigarettes, uncomplicated: Secondary | ICD-10-CM | POA: Diagnosis not present

## 2021-02-05 DIAGNOSIS — Z794 Long term (current) use of insulin: Secondary | ICD-10-CM | POA: Diagnosis not present

## 2021-02-05 DIAGNOSIS — Z833 Family history of diabetes mellitus: Secondary | ICD-10-CM | POA: Diagnosis not present

## 2021-02-05 DIAGNOSIS — E785 Hyperlipidemia, unspecified: Secondary | ICD-10-CM | POA: Diagnosis not present

## 2021-02-05 LAB — CBC WITH DIFFERENTIAL/PLATELET
Abs Immature Granulocytes: 5.59 K/uL — ABNORMAL HIGH (ref 0.00–0.07)
Basophils Absolute: 0.7 K/uL — ABNORMAL HIGH (ref 0.0–0.1)
Basophils Relative: 3 %
Eosinophils Absolute: 0.7 K/uL — ABNORMAL HIGH (ref 0.0–0.5)
Eosinophils Relative: 3 %
HCT: 37.6 % — ABNORMAL LOW (ref 39.0–52.0)
Hemoglobin: 12.3 g/dL — ABNORMAL LOW (ref 13.0–17.0)
Immature Granulocytes: 23 %
Lymphocytes Relative: 11 %
Lymphs Abs: 2.7 K/uL (ref 0.7–4.0)
MCH: 29.6 pg (ref 26.0–34.0)
MCHC: 32.7 g/dL (ref 30.0–36.0)
MCV: 90.6 fL (ref 80.0–100.0)
Monocytes Absolute: 1.9 K/uL — ABNORMAL HIGH (ref 0.1–1.0)
Monocytes Relative: 8 %
Neutro Abs: 13.1 K/uL — ABNORMAL HIGH (ref 1.7–7.7)
Neutrophils Relative %: 52 %
Platelets: 355 K/uL (ref 150–400)
RBC: 4.15 MIL/uL — ABNORMAL LOW (ref 4.22–5.81)
RDW: 15 % (ref 11.5–15.5)
Smear Review: NORMAL
WBC: 24.7 K/uL — ABNORMAL HIGH (ref 4.0–10.5)
nRBC: 1.5 % — ABNORMAL HIGH (ref 0.0–0.2)

## 2021-02-05 LAB — GLUCOSE, CAPILLARY: Glucose-Capillary: 349 mg/dL — ABNORMAL HIGH (ref 70–99)

## 2021-02-05 MED ORDER — FENTANYL CITRATE (PF) 100 MCG/2ML IJ SOLN
INTRAMUSCULAR | Status: AC
  Filled 2021-02-05: qty 2

## 2021-02-05 MED ORDER — FENTANYL CITRATE (PF) 100 MCG/2ML IJ SOLN
INTRAMUSCULAR | Status: AC | PRN
  Administered 2021-02-05 (×2): 50 ug via INTRAVENOUS

## 2021-02-05 MED ORDER — MIDAZOLAM HCL 2 MG/2ML IJ SOLN
INTRAMUSCULAR | Status: AC | PRN
  Administered 2021-02-05 (×2): 1 mg via INTRAVENOUS

## 2021-02-05 MED ORDER — MIDAZOLAM HCL 2 MG/2ML IJ SOLN
INTRAMUSCULAR | Status: AC
  Filled 2021-02-05: qty 2

## 2021-02-05 MED ORDER — HEPARIN SOD (PORK) LOCK FLUSH 100 UNIT/ML IV SOLN
INTRAVENOUS | Status: AC
  Filled 2021-02-05: qty 5

## 2021-02-05 MED ORDER — SODIUM CHLORIDE 0.9 % IV SOLN: INTRAVENOUS | Status: DC

## 2021-02-05 NOTE — Consult Note (Signed)
Chief Complaint: Chronic myeloid leukemia  Referring Physician(s): Brahmanday,Govinda R  Patient Status: [facility] - Out-pt  History of Present Illness: Frank [name] is a 60 y.o. male with past medical history significant for hypertension, hyperlipidemia, diabetes, chronic kidney disease and alcoholic cirrhosis who presents today for CT-guided bone marrow biopsy for evaluation of chronic myeloid leukemia.  Patient states that he used cocaine this past weekend.  He is presently without complaint.  Specifically, no chest pain, shortness of breath, fever or chills.   Past Medical History:  Diagnosis Date  . Arthritis   . Chronic low back pain with sciatica   . CKD (chronic kidney disease)    unknown stage  . Diabetes mellitus without complication (HCC)    insulin pump therapy  . Hemorrhoids   . History of pancreatitis   . Hyperlipidemia   . Hypertension   . 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 No pertinent surgical history.  Allergies: Patient has no known allergies.  Medications: Prior to Admission medications   Medication Sig Start Date End Date Taking? Authorizing Provider  amLODipine (NORVASC) 10 MG tablet Take 0.5 tablets by mouth daily. 12/06/19  Yes [provider]  aspirin 81 MG EC tablet Take 1 tablet by mouth daily. With a meal 01/17/20  Yes [provider]  Brinzolamide-Brimonidine 1-0.2 % SUSP Place 1 drop into both eyes in the morning, at noon, and at bedtime. 05/31/20  Yes [provider]  Cholecalciferol 25 MCG (1000 UT) tablet Take 2 tablets by mouth daily. 08/22/20  Yes [provider]  cyclobenzaprine (FLEXERIL) 10 MG tablet Take 10 mg by mouth 3 (three) times daily as needed for muscle spasms. 06/19/15  Yes [provider]  gabapentin (NEURONTIN) 300 MG capsule Take 2 capsules by mouth in the morning, at noon, and at bedtime. 08/22/20  Yes [provider]  insulin aspart (NOVOLOG) 100  UNIT/ML injection Inject 100 Units into the skin continuous. INJECT 100 UNITS UNDER SKIN CONTINUOUS-VIA-PUMP AS DIRECTED FOR DIABETES.DISCARD BOTTLE 28 DAYS AFTER OPENING 01/10/15  Yes [provider]  latanoprost (XALATAN) 0.005 % ophthalmic solution Place 1 drop into both eyes at bedtime. 06/07/16  Yes [provider]  lipase/protease/amylase (CREON) 36000 UNITS CPEP capsule See admin instructions. TAKE 2 CAPSULES BY MOUTH WITH ACTIVE MEALS AND TAKE 1 CAPSULE WITH SNACK 03/30/20  Yes [provider]  lisinopril-hydrochlorothiazide (ZESTORETIC) 20-12.5 MG tablet Take 1 tablet by mouth daily. 05/24/14  Yes [provider]  nicotine (NICODERM CQ - DOSED IN MG/24 HOURS) 14 mg/24hr patch Place 14 mg onto the skin daily. APPLY ONE PATCH TO SKIN EVERY DAY ACTIVE APPLY ONE PATCH WHEN YOU WAKE UP AND REMOVE THE OLD PATCH. PEEL THE BACK OFF THE PATCH AND PUT IT ON CLEAN,DRY, HAIR-FREE SKIN ON THE UPPER ARM, CHEST OR BACK 03/13/20  Yes [provider]  nicotine polacrilex (COMMIT) 4 MG lozenge Take 4 mg by mouth as needed. DISSOLVE 1 LOZENGE BY MOUTH AS DIRECTED - USE AT LEAST 8-10 LOZENGES EACH DAY TO START. DO NOT USE MORE THAN 20 LOZENGES 01/01/21  Yes [provider]  omeprazole (PRILOSEC) 20 MG capsule Take 20 mg by mouth daily before breakfast. 04/05/16  Yes [provider]  rosuvastatin (CRESTOR) 20 MG tablet Take 10 mg by mouth daily. 01/01/21  Yes [provider]  timolol (TIMOPTIC) 0.5 % ophthalmic solution Place 1 drop into both eyes 2 (two) times daily. 04/05/20  Yes [provider]  capsaicin (ZOSTRIX) 0.025 % cream Apply 1 application topically daily as needed. APPLY SUFFICIENT AMOUNT TOPICALLY ACTIVE EVERY DAY AVOID GETTING ON FACE, IN EYES OR ON SENSITIVE AREAS. [place] HANDS THOROUGHLY AFTER USING. Patient not taking: Reported on 02/05/2021 08/22/20   [provider]  insulin glargine (LANTUS) 100 UNIT/ML injection  INJECT 12 UNITS UNDER SKIN EVERY DAY FOR DIABETES MELLITUS DO NOT MIX WITH OTHER INSULINS IN THE SAME SYRINGE. DISCARD BOTTLE 28 DAYS AFTER OPENING  TO REPLACE BASAL INSULIN RATE IF PUMP MALFUNCTIONS FOR DIABETES MELLITUS DO NOT MIX WITH OTHER INSULINS IN THE SAME SYRINGE. DISCARD BOTTLE 28 DAYS AFTER OPENING  TO REPLACE BASAL INSULIN RATE IF PUMP MALFUNCTIONS 01/01/21   [provider]     Family History  Problem Relation Age of Onset  . Diabetes Mother   . Hypertension Mother   . Heart failure Mother   . Hyperlipidemia Mother   . Thyroid disease Mother   . Alcohol abuse Father   . Emphysema Father   . Kidney failure Father   . Gout Father   . Cancer Sister   . Hyperlipidemia Sister   . Hypertension Sister   . Rheum arthritis Sister   . Fibromyalgia Sister   . Thyroid disease Sister     Social History   Socioeconomic History  . Marital status: Single    Spouse name: Not on file  . Number of children: Not on file  . Years of education: Not on file  . Highest education level: Not on file  Occupational History  . Occupation: retired     Comment: custodian   Tobacco Use  . Smoking status: Current Some Day Smoker    Types: Cigarettes  . Smokeless tobacco: Never Used  . Tobacco comment: 4 cigarettes yesterday   Substance and Sexual Activity  . Alcohol use: Yes    Comment: hx of ETOH abuse  . Drug use: Yes    Types: Cocaine, Marijuana    Comment: had cocaine Sat. (2 days ago)  . Sexual activity: Not on file  Other Topics Concern  . Not on file  Social History Narrative   Lives with S.O. [name]    Social Determinants of Health   Financial Resource Strain: Not on file  Food Insecurity: Not on file  Transportation Needs: Not on file  Physical Activity: Not on file  Stress: Not on file  Social Connections: Not on file    ECOG Status: 0 - Asymptomatic  Review of Systems: A 12 point ROS discussed and pertinent positives are indicated in the HPI above.   All other systems are negative.  Review of Systems  Vital Signs: There were no vitals taken for this visit.  Physical Exam  Imaging: CT Angio Chest/Abd/Pel for Dissection W and/or Wo Contrast  Result Date: 01/22/2021 CLINICAL DATA:  Syncope, hypotension, thoracic back and abdominal pain. EXAM: CT ANGIOGRAPHY CHEST, ABDOMEN AND PELVIS TECHNIQUE: Non-contrast CT of the chest was initially obtained. Multidetector CT imaging through the chest, abdomen and pelvis was performed using the standard protocol during bolus administration of intravenous contrast. Multiplanar reconstructed images and MIPs were obtained and reviewed to evaluate the vascular anatomy. CONTRAST:  119mL OMNIPAQUE IOHEXOL 350 MG/ML SOLN COMPARISON:  05/11/2010 FINDINGS: CTA CHEST FINDINGS Cardiovascular: The thoracic aorta is normal in course and caliber. No intramural hematoma, dissection, or aneurysm. Arch vasculature is widely patent proximally. No significant atherosclerotic plaque identified. Global cardiac size is within normal limits. Mild left ventricular hypertrophy. Minimal coronary artery calcification.  No pericardial effusion. The central pulmonary arteries are of normal caliber. Mediastinum/Nodes: No enlarged mediastinal, hilar, or axillary lymph nodes. Thyroid gland, trachea, and esophagus demonstrate no significant findings. Lungs/Pleura: Mild dependent atelectasis. No superimposed focal pulmonary nodule or infiltrate. No pneumothorax or pleural effusion. Central airways are widely patent. Musculoskeletal: No acute bone abnormality. No lytic or blastic bone lesions. Review of the MIP images confirms the above findings. CTA ABDOMEN AND PELVIS FINDINGS VASCULAR Aorta: Mild mixed atherosclerotic plaque. No aneurysm or dissection. No perivascular inflammatory change. Celiac: Widely patent.  Normal anatomic configuration. SMA: Widely patent. Renals: Single renal arteries bilaterally. Widely patent. Normal vascular morphology. No  aneurysm. IMA: Widely patent. Inflow: Moderate mixed atherosclerotic plaque without evidence of hemodynamically significant stenosis. No aneurysm or dissection. Internal iliac arteries are patent bilaterally. Veins: Unremarkable Review of the MIP images confirms the above findings. NON-VASCULAR Hepatobiliary: No focal liver abnormality is seen. No gallstones, gallbladder wall thickening, or biliary dilatation. Pancreas: Parenchymal calcifications are seen throughout the pancreatic parenchyma in keeping with changes of chronic pancreatitis. No peripancreatic inflammatory changes are identified. There is no pancreatic ductal dilation noted. Spleen: Unremarkable Adrenals/Urinary Tract: Adrenal glands are unremarkable. Kidneys are normal, without renal calculi, focal lesion, or hydronephrosis. Bladder is unremarkable. Stomach/Bowel: Stomach is within normal limits. Appendix appears normal. No evidence of bowel wall thickening, distention, or inflammatory changes. No free intraperitoneal gas or fluid. Lymphatic: No pathologic adenopathy within the abdomen and pelvis. Reproductive: Prostate is unremarkable. Other: Small broad-based fat containing umbilical hernia. Musculoskeletal: No acute bone abnormality. No lytic or blastic bone lesions are identified within the abdomen and pelvis. Review of the MIP images confirms the above findings. IMPRESSION: No evidence of thoracoabdominal aortic aneurysm or dissection. Mild to moderate aortoiliac atherosclerotic calcification without evidence of hemodynamically significant stenosis. Minimal coronary artery calcification. Mild left ventricular hypertrophy. Chronic pancreatitis. No superimposed acute inflammatory changes identified. Aortic Atherosclerosis (ICD10-I70.0). Electronically Signed   By: Fidela Salisbury MD   On: 01/22/2021 00:21    Labs:  CBC: Recent Labs    01/21/21 2255 01/22/21 0629 01/23/21 0445 01/31/21 1436  WBC 97.1* 84.7* 72.2* 34.4*  HGB 12.6* 11.4*  11.4* 12.6*  HCT 39.4 34.7* 34.0* 38.6*  PLT 316 280 244 288    COAGS: Recent Labs    01/22/21 0100  INR 1.1  APTT <24*    BMP: Recent Labs    01/21/21 2255 01/22/21 0629 01/23/21 0445 01/31/21 1436  NA 135 134* 138 137  K 5.1 4.3 3.6 4.3  CL 107 105 110 105  CO2 21* 20* 22 23  GLUCOSE 186* 240* 93 153*  BUN 22* 18 14 22*  CALCIUM 9.3 8.7* 8.5* 9.3  CREATININE 1.89* 1.29* 0.96 1.22  GFRNONAA 40* >60 >60 >60    LIVER FUNCTION TESTS: Recent Labs    01/21/21 2255 01/23/21 0445 01/31/21 1436  BILITOT 0.7 0.4 0.5  AST 32 16 18  ALT $Re'20 15 16  'eIJ$ ALKPHOS 66 55 66  PROT 7.1 6.0* 7.6  ALBUMIN 4.0 3.5 4.3    TUMOR MARKERS: No results for input(s): AFPTM, CEA, CA199, CHROMGRNA in the last 8760 hours.  Assessment and Plan:  KANAN SOBEK is a 60 y.o. male with past medical history significant for hypertension, hyperlipidemia, diabetes, chronic kidney disease and alcoholic cirrhosis who presents today for CT-guided bone marrow biopsy for evaluation of chronic myeloid leukemia.  Patient states that he used cocaine this past weekend. He is presently without complaint.    As such, decided to  proceed with CT-guided bone marrow biopsy to help facilitate diagnosis and potential treatment.  Risks and benefits of CT guided BM Bx was discussed with the patient and/or patient's family including, but not limited to bleeding, infection, damage to adjacent structures or low yield requiring additional tests.  All of the questions were answered and there is agreement to proceed.  Consent signed and in chart.  Thank you for this interesting consult.  I greatly enjoyed meeting Frank [name] and look forward to participating in their care.  A copy of this report was sent to the requesting provider on this date.  Electronically Signed: Sandi Mariscal, MD 02/05/2021, 9:37 AM   I spent a total of 15 Minutes in face to face in clinical consultation, greater than 50% of which was  counseling/coordinating care for CT guided BM Bx.

## 2021-02-05 NOTE — Progress Notes (Signed)
Patient clinically stable post BMB per Dr Pascal Lux, tolerated well. Vitals stable pre and post procedure. Denies complaints at this time. Received Versed 2 mg along with Fentanyl 100 mcg IV for procedure. Report given to Fransico Michael RN post procedure/specials.

## 2021-02-05 NOTE — Procedures (Signed)
Pre-procedure Diagnosis: CML Post-procedure Diagnosis: Same  Technically successful CT guided bone marrow aspiration and biopsy of left iliac crest.   Complications: None Immediate  EBL: None  Signed: Sandi Mariscal Pager: [phone] 02/05/2021, 10:33 AM

## 2021-02-05 NOTE — Discharge Instructions (Signed)
Bone Marrow Aspiration and Bone Marrow Biopsy, Adult, Care After This sheet gives you information about how to care for yourself after your procedure. Your health care provider may also give you more specific instructions. If you have problems or questions, contact your health care provider. What can I expect after the procedure? After the procedure, it is common to have:  Mild pain and tenderness.  Swelling.  Bruising. Follow these instructions at home: Puncture site care  Follow instructions from your health care provider about how to take care of the puncture site. Make sure you: ? Wash your hands with soap and water before and after you change your bandage (dressing). If soap and water are not available, use hand sanitizer. ? Change your dressing as told by your health care provider.  Check your puncture site every day for signs of infection. Check for: ? More redness, swelling, or pain. ? Fluid or blood. ? Warmth. ? Pus or a bad smell.   Activity  Return to your normal activities as told by your health care provider. Ask your health care provider what activities are safe for you.  Do not lift anything that is heavier than 10 lb (4.5 kg), or the limit that you are told, until your health care provider says that it is safe.  Do not drive for 24 hours if you were given a sedative during your procedure. General instructions  Take over-the-counter and prescription medicines only as told by your health care provider.  Do not take baths, swim, or use a hot tub until your health care provider approves. Ask your health care provider if you may take showers. You may only be allowed to take sponge baths.  If directed, put ice on the affected area. To do this: ? Put ice in a plastic bag. ? Place a towel between your skin and the bag. ? Leave the ice on for 20 minutes, 2-3 times a day.  Keep all follow-up visits as told by your health care provider. This is important.   Contact a  health care provider if:  Your pain is not controlled with medicine.  You have a fever.  You have more redness, swelling, or pain around the puncture site.  You have fluid or blood coming from the puncture site.  Your puncture site feels warm to the touch.  You have pus or a bad smell coming from the puncture site. Summary  After the procedure, it is common to have mild pain, tenderness, swelling, and bruising.  Follow instructions from your health care provider about how to take care of the puncture site and what activities are safe for you.  Take over-the-counter and prescription medicines only as told by your health care provider.  Contact a health care provider if you have any signs of infection, such as fluid or blood coming from the puncture site. This information is not intended to replace advice given to you by your health care provider. Make sure you discuss any questions you have with your health care provider. Document Revised: 03/30/2019 Document Reviewed: 03/30/2019 Elsevier Patient Education  2021 [employer]  

## 2021-02-08 LAB — BCR-ABL1, CML/ALL, PCR, QUANT
E1A2 Transcript: 0.0316 %
Interpretation (BCRAL):: POSITIVE
b2a2 transcript: 0.0948 %
b3a2 transcript: 76.2476 %

## 2021-02-12 ENCOUNTER — Telehealth: Payer: Self-pay | Admitting: Internal Medicine

## 2021-02-12 NOTE — Telephone Encounter (Signed)
On 3/21-spoke to patient regarding results of the bone marrow biopsy suggestive of chronic myeloid leukemia; follow-up as planned next week to discuss the treatment options. GB

## 2021-02-13 ENCOUNTER — Encounter (HOSPITAL_COMMUNITY): Payer: Self-pay | Admitting: Internal Medicine

## 2021-02-19 ENCOUNTER — Telehealth: Payer: Self-pay | Admitting: Pharmacist

## 2021-02-19 ENCOUNTER — Telehealth: Payer: Self-pay | Admitting: Pharmacy Technician

## 2021-02-19 ENCOUNTER — Inpatient Hospital Stay (HOSPITAL_BASED_OUTPATIENT_CLINIC_OR_DEPARTMENT_OTHER): Payer: Medicare Other | Admitting: Internal Medicine

## 2021-02-19 ENCOUNTER — Encounter: Payer: Self-pay | Admitting: Internal Medicine

## 2021-02-19 ENCOUNTER — Inpatient Hospital Stay: Payer: Medicare Other | Admitting: Pharmacist

## 2021-02-19 DIAGNOSIS — C9210 Chronic myeloid leukemia, BCR/ABL-positive, not having achieved remission: Secondary | ICD-10-CM

## 2021-02-19 MED ORDER — DASATINIB 100 MG PO TABS: 100.0000 mg | ORAL_TABLET | Freq: Every day | ORAL | 3 refills | Status: DC

## 2021-02-19 NOTE — Progress Notes (Signed)
[place] NOTE  Patient Care Team: Administration, Veterans as PCP - General  CHIEF COMPLAINTS/PURPOSE OF CONSULTATION: Neutrophilia/leukocytosis  #  Oncology History Overview Note  Result Comment:   Comment: (NOTE)  ABNORMAL: 89% OF NUCLEI POSITIVE FOR BCR/ABL1 GENE FUSION  SIGNAL(S)     # MARCH 2022--  Hypercellular bone marrow with involvement by chronic myeloid  leukemia, BCR-ABL1-positive . he bone marrow is hypercellular for age with myeloid and megakaryocytic  hyperplasia.  There is no increase in blasts by manual differential count of aspirate smears (<1%) or by CD34 immunohistochemistry on the core biopsy.  A reticulin special stain reveals no significant increase  in reticulin fibrosis (MF grade 0 of 3).  # April 1st week -DASATINIB 100    #hypertension /alcoholic liver/ type 2 diabetes on insulin poorly controlled chronic back pain    Chronic myeloid leukemia ([place])  01/31/2021 Initial Diagnosis   Chronic myeloid leukemia (HCC)      HISTORY OF PRESENTING ILLNESS:  Frank [name] 60 y.o.  male with newly diagnosed CML is here to review the results of his bone marrow biopsy/and the treatment plan.  Patient currently denies any new onset of abdominal pain nausea vomiting.  Denies any diarrhea.  Review of Systems  Constitutional: Positive for malaise/fatigue. Negative for chills, diaphoresis, fever and weight loss.  HENT: Negative for nosebleeds and sore throat.   Eyes: Negative for double vision.  Respiratory: Negative for cough, hemoptysis, sputum production, shortness of breath and wheezing.   Cardiovascular: Negative for chest pain, palpitations, orthopnea and leg swelling.  Gastrointestinal: Negative for abdominal pain, blood in stool, constipation, diarrhea, heartburn, melena, nausea and vomiting.  Genitourinary: Negative for dysuria, frequency and urgency.  Musculoskeletal: Positive for back pain and joint pain.  Skin: Negative.   Negative for itching and rash.  Neurological: Negative for dizziness, tingling, focal weakness, weakness and headaches.  Endo/Heme/Allergies: Does not bruise/bleed easily.  Psychiatric/Behavioral: Negative for depression. The patient is not nervous/anxious and does not have insomnia.      MEDICAL HISTORY:  Past Medical History:  Diagnosis Date  . Arthritis   . Chronic low back pain with sciatica   . CKD (chronic kidney disease)    unknown stage  . Diabetes mellitus without complication (HCC)    insulin pump therapy  . Hemorrhoids   . History of pancreatitis   . Hyperlipidemia   . Hypertension   . 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)     SURGICAL HISTORY: No past surgical history on file.  SOCIAL HISTORY: Social History   Socioeconomic History  . Marital status: Single    Spouse name: Not on file  . Number of children: Not on file  . Years of education: Not on file  . Highest education level: Not on file  Occupational History  . Occupation: retired     Comment: custodian   Tobacco Use  . Smoking status: Current Some Day Smoker    Types: Cigarettes  . Smokeless tobacco: Never Used  . Tobacco comment: 4 cigarettes yesterday   Substance and Sexual Activity  . Alcohol use: Yes    Comment: hx of ETOH abuse  . Drug use: Yes    Types: Cocaine, Marijuana    Comment: had cocaine Sat. (2 days ago)  . Sexual activity: Not on file  Other Topics Concern  . Not on file  Social History Narrative   Lives with S.O. [name]    Social Determinants of Health   Financial  Resource Strain: Not on file  Food Insecurity: Not on file  Transportation Needs: Not on file  Physical Activity: Not on file  Stress: Not on file  Social Connections: Not on file  Intimate Partner Violence: Not on file    FAMILY HISTORY: Family History  Problem Relation Age of Onset  . Diabetes Mother   . Hypertension Mother   . Heart failure Mother   . Hyperlipidemia Mother   . Thyroid disease  Mother   . Alcohol abuse Father   . Emphysema Father   . Kidney failure Father   . Gout Father   . Cancer Sister   . Hyperlipidemia Sister   . Hypertension Sister   . Rheum arthritis Sister   . Fibromyalgia Sister   . Thyroid disease Sister     ALLERGIES:  has No Known Allergies.  MEDICATIONS:  Current Outpatient Medications  Medication Sig Dispense Refill  . dasatinib (SPRYCEL) 100 MG tablet Take 1 tablet (100 mg total) by mouth daily. 30 tablet 3  . amLODipine (NORVASC) 10 MG tablet Take 0.5 tablets by mouth daily.    Marland Kitchen aspirin 81 MG EC tablet Take 1 tablet by mouth daily. With a meal    . Brinzolamide-Brimonidine 1-0.2 % SUSP Place 1 drop into both eyes in the morning, at noon, and at bedtime.    . capsaicin (ZOSTRIX) 0.025 % cream Apply 1 application topically daily as needed. APPLY SUFFICIENT AMOUNT TOPICALLY ACTIVE EVERY DAY AVOID GETTING ON FACE, IN EYES OR ON SENSITIVE AREAS. Maribel HANDS THOROUGHLY AFTER USING. (Patient not taking: Reported on 02/05/2021)    . Cholecalciferol 25 MCG (1000 UT) tablet Take 2 tablets by mouth daily.    . cyclobenzaprine (FLEXERIL) 10 MG tablet Take 10 mg by mouth 3 (three) times daily as needed for muscle spasms.    Marland Kitchen gabapentin (NEURONTIN) 300 MG capsule Take 2 capsules by mouth in the morning, at noon, and at bedtime.    . insulin aspart (NOVOLOG) 100 UNIT/ML injection Inject 100 Units into the skin continuous. INJECT 100 UNITS UNDER SKIN CONTINUOUS-VIA-PUMP AS DIRECTED FOR DIABETES.DISCARD BOTTLE 28 DAYS AFTER OPENING    . insulin glargine (LANTUS) 100 UNIT/ML injection INJECT 12 UNITS UNDER SKIN EVERY DAY FOR DIABETES MELLITUS DO NOT MIX WITH OTHER INSULINS IN THE SAME SYRINGE. DISCARD BOTTLE 28 DAYS AFTER OPENING  TO REPLACE BASAL INSULIN RATE IF PUMP MALFUNCTIONS FOR DIABETES MELLITUS DO NOT MIX WITH OTHER INSULINS IN THE SAME SYRINGE. DISCARD BOTTLE 28 DAYS AFTER OPENING  TO REPLACE BASAL INSULIN RATE IF PUMP MALFUNCTIONS    . latanoprost  (XALATAN) 0.005 % ophthalmic solution Place 1 drop into both eyes at bedtime.    . lipase/protease/amylase (CREON) 36000 UNITS CPEP capsule See admin instructions. TAKE 2 CAPSULES BY MOUTH WITH ACTIVE MEALS AND TAKE 1 CAPSULE WITH SNACK    . lisinopril-hydrochlorothiazide (ZESTORETIC) 20-12.5 MG tablet Take 1 tablet by mouth daily.    . nicotine (NICODERM CQ - DOSED IN MG/24 HOURS) 14 mg/24hr patch Place 14 mg onto the skin daily. APPLY ONE PATCH TO SKIN EVERY DAY ACTIVE APPLY ONE PATCH WHEN YOU WAKE UP AND REMOVE THE OLD PATCH. PEEL THE BACK OFF THE PATCH AND PUT IT ON CLEAN,DRY, HAIR-FREE SKIN ON THE UPPER ARM, CHEST OR BACK    . nicotine polacrilex (COMMIT) 4 MG lozenge Take 4 mg by mouth as needed. DISSOLVE 1 LOZENGE BY MOUTH AS DIRECTED - USE AT LEAST 8-10 LOZENGES EACH DAY TO START. DO NOT USE MORE THAN  20 LOZENGES    . omeprazole (PRILOSEC) 20 MG capsule Take 20 mg by mouth daily before breakfast.    . rosuvastatin (CRESTOR) 20 MG tablet Take 10 mg by mouth daily.    . timolol (TIMOPTIC) 0.5 % ophthalmic solution Place 1 drop into both eyes 2 (two) times daily.     No current facility-administered medications for this visit.      Marland Kitchen  PHYSICAL EXAMINATION: ECOG PERFORMANCE STATUS: 0 - Asymptomatic  Vitals:   02/19/21 0942  BP: 131/86  Pulse: 71  Resp: 18  Temp: 97.6 F (36.4 C)   Filed Weights   02/19/21 0942  Weight: 186 lb 3.2 oz (84.5 kg)    Physical Exam Constitutional:      Comments: Patient ambulating independently.  He is accompanied by wife/significant other.  HENT:     Head: Normocephalic and atraumatic.     Mouth/Throat:     Pharynx: No oropharyngeal exudate.  Eyes:     Pupils: Pupils are equal, round, and reactive to light.  Cardiovascular:     Rate and Rhythm: Normal rate and regular rhythm.  Pulmonary:     Effort: No respiratory distress.     Breath sounds: No wheezing.     Comments: Decreased air entry bilaterally. Abdominal:     General: Bowel  sounds are normal. There is no distension.     Palpations: Abdomen is soft. There is no mass.     Tenderness: There is no abdominal tenderness. There is no guarding or rebound.  Musculoskeletal:        General: No tenderness. Normal range of motion.     Cervical back: Normal range of motion and neck supple.  Skin:    General: Skin is warm.  Neurological:     Mental Status: He is alert and oriented to person, place, and time.  Psychiatric:        Mood and Affect: Affect normal.      LABORATORY DATA:  I have reviewed the data as listed Lab Results  Component Value Date   WBC 24.7 (H) 02/05/2021   HGB 12.3 (L) 02/05/2021   HCT 37.6 (L) 02/05/2021   MCV 90.6 02/05/2021   PLT 355 02/05/2021   Recent Labs    01/21/21 2255 01/22/21 0629 01/23/21 0445 01/31/21 1436  NA 135 134* 138 137  K 5.1 4.3 3.6 4.3  CL 107 105 110 105  CO2 21* 20* 22 23  GLUCOSE 186* 240* 93 153*  BUN 22* 18 14 22*  CREATININE 1.89* 1.29* 0.96 1.22  CALCIUM 9.3 8.7* 8.5* 9.3  GFRNONAA 40* >60 >60 >60  PROT 7.1  --  6.0* 7.6  ALBUMIN 4.0  --  3.5 4.3  AST 32  --  16 18  ALT 20  --  15 16  ALKPHOS 66  --  55 66  BILITOT 0.7  --  0.4 0.5    RADIOGRAPHIC STUDIES: I have personally reviewed the radiological images as listed and agreed with the findings in the report. CT BONE MARROW BIOPSY & ASPIRATION  Result Date: 02/05/2021 INDICATION: Chronic myeloid leukemia. Please perform CT-guided bone marrow biopsy for tissue diagnostic purposes. EXAM: CT-GUIDED BONE MARROW BIOPSY AND ASPIRATION MEDICATIONS: None ANESTHESIA/SEDATION: Fentanyl 100 mcg IV; Versed 2 mg IV Sedation Time: 10 Minutes; The patient was continuously monitored during the procedure by the interventional radiology nurse under my direct supervision. COMPLICATIONS: None immediate. PROCEDURE: Informed consent was obtained from the patient following an explanation of the procedure, risks,  benefits and alternatives. The patient understands,  agrees and consents for the procedure. All questions were addressed. A time out was performed prior to the initiation of the procedure. The patient was positioned prone and non-contrast localization CT was performed of the pelvis to demonstrate the iliac marrow spaces. The operative site was prepped and draped in the usual sterile fashion. Under sterile conditions and local anesthesia, a 22 gauge spinal needle was utilized for procedural planning. Next, an 11 gauge coaxial bone biopsy needle was advanced into the left iliac marrow space. Needle position was confirmed with CT imaging. Initially, a bone marrow aspiration was performed. Next, a bone marrow biopsy was obtained with the 11 gauge outer bone marrow device. The needle was removed and superficial hemostasis was obtained with manual compression. A dressing was applied. The patient tolerated the procedure well without immediate post procedural complication. IMPRESSION: Successful CT guided left iliac bone marrow aspiration and core biopsy. Electronically Signed   By: Sandi Mariscal M.D.   On: 02/05/2021 10:55   CT Angio Chest/Abd/Pel for Dissection W and/or Wo Contrast  Result Date: 01/22/2021 CLINICAL DATA:  Syncope, hypotension, thoracic back and abdominal pain. EXAM: CT ANGIOGRAPHY CHEST, ABDOMEN AND PELVIS TECHNIQUE: Non-contrast CT of the chest was initially obtained. Multidetector CT imaging through the chest, abdomen and pelvis was performed using the standard protocol during bolus administration of intravenous contrast. Multiplanar reconstructed images and MIPs were obtained and reviewed to evaluate the vascular anatomy. CONTRAST:  189mL OMNIPAQUE IOHEXOL 350 MG/ML SOLN COMPARISON:  05/11/2010 FINDINGS: CTA CHEST FINDINGS Cardiovascular: The thoracic aorta is normal in course and caliber. No intramural hematoma, dissection, or aneurysm. Arch vasculature is widely patent proximally. No significant atherosclerotic plaque identified. Global cardiac size  is within normal limits. Mild left ventricular hypertrophy. Minimal coronary artery calcification. No pericardial effusion. The central pulmonary arteries are of normal caliber. Mediastinum/Nodes: No enlarged mediastinal, hilar, or axillary lymph nodes. Thyroid gland, trachea, and esophagus demonstrate no significant findings. Lungs/Pleura: Mild dependent atelectasis. No superimposed focal pulmonary nodule or infiltrate. No pneumothorax or pleural effusion. Central airways are widely patent. Musculoskeletal: No acute bone abnormality. No lytic or blastic bone lesions. Review of the MIP images confirms the above findings. CTA ABDOMEN AND PELVIS FINDINGS VASCULAR Aorta: Mild mixed atherosclerotic plaque. No aneurysm or dissection. No perivascular inflammatory change. Celiac: Widely patent.  Normal anatomic configuration. SMA: Widely patent. Renals: Single renal arteries bilaterally. Widely patent. Normal vascular morphology. No aneurysm. IMA: Widely patent. Inflow: Moderate mixed atherosclerotic plaque without evidence of hemodynamically significant stenosis. No aneurysm or dissection. Internal iliac arteries are patent bilaterally. Veins: Unremarkable Review of the MIP images confirms the above findings. NON-VASCULAR Hepatobiliary: No focal liver abnormality is seen. No gallstones, gallbladder wall thickening, or biliary dilatation. Pancreas: Parenchymal calcifications are seen throughout the pancreatic parenchyma in keeping with changes of chronic pancreatitis. No peripancreatic inflammatory changes are identified. There is no pancreatic ductal dilation noted. Spleen: Unremarkable Adrenals/Urinary Tract: Adrenal glands are unremarkable. Kidneys are normal, without renal calculi, focal lesion, or hydronephrosis. Bladder is unremarkable. Stomach/Bowel: Stomach is within normal limits. Appendix appears normal. No evidence of bowel wall thickening, distention, or inflammatory changes. No free intraperitoneal gas or  fluid. Lymphatic: No pathologic adenopathy within the abdomen and pelvis. Reproductive: Prostate is unremarkable. Other: Small broad-based fat containing umbilical hernia. Musculoskeletal: No acute bone abnormality. No lytic or blastic bone lesions are identified within the abdomen and pelvis. Review of the MIP images confirms the above findings. IMPRESSION: No evidence of thoracoabdominal aortic  aneurysm or dissection. Mild to moderate aortoiliac atherosclerotic calcification without evidence of hemodynamically significant stenosis. Minimal coronary artery calcification. Mild left ventricular hypertrophy. Chronic pancreatitis. No superimposed acute inflammatory changes identified. Aortic Atherosclerosis (ICD10-I70.0). Electronically Signed   By: Fidela Salisbury MD   On: 01/22/2021 00:21    ASSESSMENT & PLAN:   Chronic myeloid leukemia ([place]) #Chronic myeloid leukemia- [confirmed on bone marrow biopsy]; reviewed with the patient/significant other.  #Recommend starting patient on dasatinib 100 mg once a day.  Discussed the mechanism of action and also potential adverse events including but not limited to skin rash diarrhea swelling in the legs; pleural effusions etc. also discussed regarding QT prolongation.  Patient'S recent EKG-no evidence of QT prolongation.  Will recheck EKG in approximately 2 weeks.  Patient understands treatments are to control the disease; and unfortunately incurable.  Understands treatments are indefinite and after lengthy discussion patient agrees with the treatment plan.  He will start the drug when available.  Discussed with Ebony Hail.  #Substance drug abuse-March 2022-positive for cocaine [[facility]]-counseled the patient to quit illicit drugs.  Reminded the patient will avoid any illicit drugs.  #Diabetes-poorly controlled; currently on insulin.  Monitor closely.  # DISPOSITION:* EKG # follow up in 3 weeks-MD; Dr.B  # 40 minutes face-to-face with the patient discussing the above  plan of care; more than 50% of time spent on prognosis/ natural history; counseling and coordination.   All questions were answered. The patient knows to call the clinic with any problems, questions or concerns.     Cammie Sickle, MD 02/19/2021 5:11 PM

## 2021-02-19 NOTE — Telephone Encounter (Signed)
Oral Oncology Pharmacist Encounter  Received new prescription for Sprycel (dasatinib) for the treatment of newly diagnosed CML, planned duration until disease control or unacceptable drug toxicity.  CMP from 01/31/21 assessed, no relevant lab abnormalities. Prescription dose and frequency assessed.   Current medication list in Epic reviewed, a few DDIs with dasatinib identified: - Omeprazole: Proton Pump Inhibitors (PPI) may decrease the concentration of dasatinib. If acid suppression is needed, antacids like calcium carbonate can be used if separated by at least 2 hours from the dasatinib. Frank [name] is going to try weaning off of his omeprazole but first going to every other day, then stopping.  - Aspirin: Dasatinib may enhance the anticoagulant effect of aspirin. Monitor plts and s/sx of bleeding.  Evaluated chart and no patient barriers to medication adherence identified.   Prescription has been [facility]scribed to the [facility]. Bethena Roys discovered a recent error was made in Frank [name] prescription coverage. He called to get that fixed and was told it would be fixed on 02/23/21. Bethena Roys will try to reprocess the prescription at that time. If there is still a prescription coverage issue, we will get Frank [name] started with a 30-day free voucher and proceed with manufacturer assistance.  Per Dr. Rogue Bussing, we can start when he has medication in hand.  Oral Oncology Clinic will continue to follow for insurance authorization, copayment issues, and start date.  Patient educated during visit on 02/19/21.   Darl Pikes, PharmD, BCPS, BCOP, CPP Hematology/Oncology Clinical Pharmacist Practitioner [facility]/HP/AP [facility] [phone]  02/19/2021 12:58 PM

## 2021-02-19 NOTE — Assessment & Plan Note (Addendum)
#  Chronic myeloid leukemia- [confirmed on bone marrow biopsy]; reviewed with the patient/significant other.  #Recommend starting patient on dasatinib 100 mg once a day.  Discussed the mechanism of action and also potential adverse events including but not limited to skin rash diarrhea swelling in the legs; pleural effusions etc. also discussed regarding QT prolongation.  Patient'S recent EKG-no evidence of QT prolongation.  Will recheck EKG in approximately 2 weeks.  Patient understands treatments are to control the disease; and unfortunately incurable.  Understands treatments are indefinite and after lengthy discussion patient agrees with the treatment plan.  He will start the drug when available.  Discussed with Ebony Hail.  #Substance drug abuse-March 2022-positive for cocaine [[facility]]-counseled the patient to quit illicit drugs.  Reminded the patient will avoid any illicit drugs.  #Diabetes-poorly controlled; currently on insulin.  Monitor closely.  # DISPOSITION:* EKG # follow up in 3 weeks-MD; Dr.B  # 40 minutes face-to-face with the patient discussing the above plan of care; more than 50% of time spent on prognosis/ natural history; counseling and coordination.

## 2021-02-19 NOTE — Progress Notes (Signed)
[place]  [phone] [phone]  Patient Care Team: Administration, [place] as PCP - General   Name of the patient: Frank [name]  [id number]  23-Mar-1961   Date of visit: 02/19/21  HPI: Patient is a 60 y.o. male with newly diagnosed chronic phase CML. Planned treatment with Sprycel (dasatinib).  Reason for Consult: Sprycel (dasatinib) oral chemotherapy education.   PAST MEDICAL HISTORY: Past Medical History:  Diagnosis Date  . Arthritis   . Chronic low back pain with sciatica   . CKD (chronic kidney disease)    unknown stage  . Diabetes mellitus without complication (HCC)    insulin pump therapy  . Hemorrhoids   . History of pancreatitis   . Hyperlipidemia   . Hypertension   . 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  HEMATOLOGY/ONCOLOGY HISTORY:  Oncology History Overview Note  Result Comment:   Comment: (NOTE)  ABNORMAL: 89% OF NUCLEI POSITIVE FOR BCR/ABL1 GENE FUSION  SIGNAL(S)        Chronic myeloid leukemia ([place])  01/31/2021 Initial Diagnosis   Chronic myeloid leukemia ([place])     ALLERGIES:  has No Known Allergies.  MEDICATIONS:  Current Outpatient Medications  Medication Sig Dispense Refill  . amLODipine (NORVASC) 10 MG tablet Take 0.5 tablets by mouth daily.    Marland Kitchen aspirin 81 MG EC tablet Take 1 tablet by mouth daily. With a meal    . Brinzolamide-Brimonidine 1-0.2 % SUSP Place 1 drop into both eyes in the morning, at noon, and at bedtime.    . capsaicin (ZOSTRIX) 0.025 % cream Apply 1 application topically daily as needed. APPLY SUFFICIENT AMOUNT TOPICALLY ACTIVE EVERY DAY AVOID GETTING ON FACE, IN EYES OR ON SENSITIVE AREAS. [place] HANDS THOROUGHLY AFTER USING. (Patient not taking: Reported on 02/05/2021)    . Cholecalciferol 25 MCG (1000 UT) tablet Take 2 tablets by mouth daily.    . cyclobenzaprine (FLEXERIL) 10 MG tablet  Take 10 mg by mouth 3 (three) times daily as needed for muscle spasms.    . dasatinib (SPRYCEL) 100 MG tablet Take 1 tablet (100 mg total) by mouth daily. 30 tablet 3  . gabapentin (NEURONTIN) 300 MG capsule Take 2 capsules by mouth in the morning, at noon, and at bedtime.    . insulin aspart (NOVOLOG) 100 UNIT/ML injection Inject 100 Units into the skin continuous. INJECT 100 UNITS UNDER SKIN CONTINUOUS-VIA-PUMP AS DIRECTED FOR DIABETES.DISCARD BOTTLE 28 DAYS AFTER OPENING    . insulin glargine (LANTUS) 100 UNIT/ML injection INJECT 12 UNITS UNDER SKIN EVERY DAY FOR DIABETES MELLITUS DO NOT MIX WITH OTHER INSULINS IN THE SAME SYRINGE. DISCARD BOTTLE 28 DAYS AFTER OPENING  TO REPLACE BASAL INSULIN RATE IF PUMP MALFUNCTIONS FOR DIABETES MELLITUS DO NOT MIX WITH OTHER INSULINS IN THE SAME SYRINGE. DISCARD BOTTLE 28 DAYS AFTER OPENING  TO REPLACE BASAL INSULIN RATE IF PUMP MALFUNCTIONS    . latanoprost (XALATAN) 0.005 % ophthalmic solution Place 1 drop into both eyes at bedtime.    . lipase/protease/amylase (CREON) 36000 UNITS CPEP capsule See admin instructions. TAKE 2 CAPSULES BY MOUTH WITH ACTIVE MEALS AND TAKE 1 CAPSULE WITH SNACK    . lisinopril-hydrochlorothiazide (ZESTORETIC) 20-12.5 MG tablet Take 1 tablet by mouth daily.    . nicotine (NICODERM CQ - DOSED IN MG/24 HOURS) 14 mg/24hr patch Place 14 mg onto the skin daily. APPLY ONE PATCH TO SKIN EVERY DAY ACTIVE APPLY ONE  PATCH WHEN YOU WAKE UP AND REMOVE THE OLD PATCH. PEEL THE BACK OFF THE PATCH AND PUT IT ON CLEAN,DRY, HAIR-FREE SKIN ON THE UPPER ARM, CHEST OR BACK    . nicotine polacrilex (COMMIT) 4 MG lozenge Take 4 mg by mouth as needed. DISSOLVE 1 LOZENGE BY MOUTH AS DIRECTED - USE AT LEAST 8-10 LOZENGES EACH DAY TO START. DO NOT USE MORE THAN 20 LOZENGES    . omeprazole (PRILOSEC) 20 MG capsule Take 20 mg by mouth daily before breakfast.    . rosuvastatin (CRESTOR) 20 MG tablet Take 10 mg by mouth daily.    . timolol (TIMOPTIC) 0.5 %  ophthalmic solution Place 1 drop into both eyes 2 (two) times daily.     No current facility-administered medications for this visit.    VITAL SIGNS: There were no vitals taken for this visit. There were no vitals filed for this visit.  Estimated body mass index is 28.31 kg/m as calculated from the following:   Height as of 02/05/21: 5\' 8"  (1.727 m).   Weight as of an earlier encounter on 02/19/21: 84.5 kg (186 lb 3.2 oz).  LABS: CBC:    Component Value Date/Time   WBC 24.7 (H) 02/05/2021 0927   HGB 12.3 (L) 02/05/2021 0927   HGB 12.7 (L) 09/14/2014 0509   HCT 37.6 (L) 02/05/2021 0927   HCT 39.4 (L) 09/14/2014 0509   PLT 355 02/05/2021 0927   PLT 200 09/14/2014 0509   MCV 90.6 02/05/2021 0927   MCV 91 09/14/2014 0509   NEUTROABS 13.1 (H) 02/05/2021 0927   NEUTROABS 5.5 09/14/2014 0509   LYMPHSABS 2.7 02/05/2021 0927   LYMPHSABS 2.8 09/14/2014 0509   MONOABS 1.9 (H) 02/05/2021 0927   MONOABS 0.9 09/14/2014 0509   EOSABS 0.7 (H) 02/05/2021 0927   EOSABS 0.2 09/14/2014 0509   BASOSABS 0.7 (H) 02/05/2021 0927   BASOSABS 0.1 09/14/2014 0509   Comprehensive Metabolic Panel:    Component Value Date/Time   NA 137 01/31/2021 1436   NA 142 09/15/2014 0500   K 4.3 01/31/2021 1436   K 4.0 09/15/2014 0500   CL 105 01/31/2021 1436   CL 112 (H) 09/15/2014 0500   CO2 23 01/31/2021 1436   CO2 22 09/15/2014 0500   BUN 22 (H) 01/31/2021 1436   BUN 13 09/15/2014 0500   CREATININE 1.22 01/31/2021 1436   CREATININE 1.09 09/15/2014 0500   GLUCOSE 153 (H) 01/31/2021 1436   GLUCOSE 110 (H) 09/15/2014 0500   CALCIUM 9.3 01/31/2021 1436   CALCIUM 8.4 (L) 09/15/2014 0500   AST 18 01/31/2021 1436   AST 18 09/13/2014 1341   ALT 16 01/31/2021 1436   ALT 25 09/13/2014 1341   ALKPHOS 66 01/31/2021 1436   ALKPHOS 86 09/13/2014 1341   BILITOT 0.5 01/31/2021 1436   BILITOT 0.2 09/13/2014 1341   PROT 7.6 01/31/2021 1436   PROT 7.5 09/13/2014 1341   ALBUMIN 4.3 01/31/2021 1436   ALBUMIN  3.7 09/13/2014 1341    RADIOGRAPHIC STUDIES: CT BONE MARROW BIOPSY & ASPIRATION  Result Date: 02/05/2021 INDICATION: Chronic myeloid leukemia. Please perform CT-guided bone marrow biopsy for tissue diagnostic purposes. EXAM: CT-GUIDED BONE MARROW BIOPSY AND ASPIRATION MEDICATIONS: None ANESTHESIA/SEDATION: Fentanyl 100 mcg IV; Versed 2 mg IV Sedation Time: 10 Minutes; The patient was continuously monitored during the procedure by the interventional radiology nurse under my direct supervision. COMPLICATIONS: None immediate. PROCEDURE: Informed consent was obtained from the patient following an explanation of the procedure, risks, benefits and  alternatives. The patient understands, agrees and consents for the procedure. All questions were addressed. A time out was performed prior to the initiation of the procedure. The patient was positioned prone and non-contrast localization CT was performed of the pelvis to demonstrate the iliac marrow spaces. The operative site was prepped and draped in the usual sterile fashion. Under sterile conditions and local anesthesia, a 22 gauge spinal needle was utilized for procedural planning. Next, an 11 gauge coaxial bone biopsy needle was advanced into the left iliac marrow space. Needle position was confirmed with CT imaging. Initially, a bone marrow aspiration was performed. Next, a bone marrow biopsy was obtained with the 11 gauge outer bone marrow device. The needle was removed and superficial hemostasis was obtained with manual compression. A dressing was applied. The patient tolerated the procedure well without immediate post procedural complication. IMPRESSION: Successful CT guided left iliac bone marrow aspiration and core biopsy. Electronically Signed   By: Sandi Mariscal M.D.   On: 02/05/2021 10:55   CT Angio Chest/Abd/Pel for Dissection W and/or Wo Contrast  Result Date: 01/22/2021 CLINICAL DATA:  Syncope, hypotension, thoracic back and abdominal pain. EXAM: CT  ANGIOGRAPHY CHEST, ABDOMEN AND PELVIS TECHNIQUE: Non-contrast CT of the chest was initially obtained. Multidetector CT imaging through the chest, abdomen and pelvis was performed using the standard protocol during bolus administration of intravenous contrast. Multiplanar reconstructed images and MIPs were obtained and reviewed to evaluate the vascular anatomy. CONTRAST:  18mL OMNIPAQUE IOHEXOL 350 MG/ML SOLN COMPARISON:  05/11/2010 FINDINGS: CTA CHEST FINDINGS Cardiovascular: The thoracic aorta is normal in course and caliber. No intramural hematoma, dissection, or aneurysm. Arch vasculature is widely patent proximally. No significant atherosclerotic plaque identified. Global cardiac size is within normal limits. Mild left ventricular hypertrophy. Minimal coronary artery calcification. No pericardial effusion. The central pulmonary arteries are of normal caliber. Mediastinum/Nodes: No enlarged mediastinal, hilar, or axillary lymph nodes. Thyroid gland, trachea, and esophagus demonstrate no significant findings. Lungs/Pleura: Mild dependent atelectasis. No superimposed focal pulmonary nodule or infiltrate. No pneumothorax or pleural effusion. Central airways are widely patent. Musculoskeletal: No acute bone abnormality. No lytic or blastic bone lesions. Review of the MIP images confirms the above findings. CTA ABDOMEN AND PELVIS FINDINGS VASCULAR Aorta: Mild mixed atherosclerotic plaque. No aneurysm or dissection. No perivascular inflammatory change. Celiac: Widely patent.  Normal anatomic configuration. SMA: Widely patent. Renals: Single renal arteries bilaterally. Widely patent. Normal vascular morphology. No aneurysm. IMA: Widely patent. Inflow: Moderate mixed atherosclerotic plaque without evidence of hemodynamically significant stenosis. No aneurysm or dissection. Internal iliac arteries are patent bilaterally. Veins: Unremarkable Review of the MIP images confirms the above findings. NON-VASCULAR Hepatobiliary:  No focal liver abnormality is seen. No gallstones, gallbladder wall thickening, or biliary dilatation. Pancreas: Parenchymal calcifications are seen throughout the pancreatic parenchyma in keeping with changes of chronic pancreatitis. No peripancreatic inflammatory changes are identified. There is no pancreatic ductal dilation noted. Spleen: Unremarkable Adrenals/Urinary Tract: Adrenal glands are unremarkable. Kidneys are normal, without renal calculi, focal lesion, or hydronephrosis. Bladder is unremarkable. Stomach/Bowel: Stomach is within normal limits. Appendix appears normal. No evidence of bowel wall thickening, distention, or inflammatory changes. No free intraperitoneal gas or fluid. Lymphatic: No pathologic adenopathy within the abdomen and pelvis. Reproductive: Prostate is unremarkable. Other: Small broad-based fat containing umbilical hernia. Musculoskeletal: No acute bone abnormality. No lytic or blastic bone lesions are identified within the abdomen and pelvis. Review of the MIP images confirms the above findings. IMPRESSION: No evidence of thoracoabdominal aortic aneurysm or dissection.  Mild to moderate aortoiliac atherosclerotic calcification without evidence of hemodynamically significant stenosis. Minimal coronary artery calcification. Mild left ventricular hypertrophy. Chronic pancreatitis. No superimposed acute inflammatory changes identified. Aortic Atherosclerosis (ICD10-I70.0). Electronically Signed   By: Fidela Salisbury MD   On: 01/22/2021 00:21     Assessment and Plan-  Start Sprycel (dasatinib) once medication is in hand.    Patient Education I spoke with patient and his girlfriend Caren Griffins for overview of new oral chemotherapy medication: Sprycel (dasatinib) for the treatment of newly diagnosed CML, planned duration until disease control or unacceptable drug toxicity.   Counseled patient on administration, dosing, side effects, monitoring, drug-food interactions, safe handling,  storage, and disposal. Patient will take 1 tablet (100 mg total) by mouth daily.  Side effects include but not limited to: decreased wbc/hgb/plt, diarrhea, edema.    Drug interaction with omeprazole discussed. Patient will attempt to wean off his omeprazole. He knows he can use calcium carbonate as an alternative if separated from the dasatinib by at least 2 hours.   Reviewed with patient importance of keeping a medication schedule and plan for any missed doses.  After discussion with patient no patient barriers to medication adherence identified.   Frank [name] voiced understanding and appreciation. All questions answered. Medication handout provided.  Provided patient with [facility] phone number. Patient knows to call the office with questions or concerns. Oral [facility] will continue to follow.  Medication Access Issues: Access pending, Bethena Roys is looking into insurance prescription coverage for Frank [name]. He signed manufacturer assistance application as a back up during today's visit,  Patient expressed understanding and was in agreement with this plan. He also understands that He can call clinic at any time with any questions, concerns, or complaints.   Thank you for allowing me to participate in the care of this very pleasant patient.   Time Total: 20 mins  Visit consisted of counseling and education on dealing with issues of symptom management in the setting of serious and potentially life-threatening illness.Greater than 50%  of this time was spent counseling and coordinating care related to the above assessment and plan.  Signed by: Darl Pikes, PharmD, BCPS, Salley Slaughter, CPP Hematology/Oncology Clinical Pharmacist Practitioner [facility] [phone]  02/19/2021 12:34 PM

## 2021-02-19 NOTE — Telephone Encounter (Signed)
Oral Oncology Patient Advocate Encounter  Performed eligibility check for pharmacy benefits and patient has Part B drug coverage only through [facility] Medicare that started on 01/23/21.  Patient also has Medicaid, but test claim rejected with submit to primary coverage.  Informed patient to call Member Services number on the back of his card to speak with a representative and get his plan changed to Part D coverage.  In the meantime, I told patient we can get him started with a 30 day free voucher of Sprycel and also had him sign an application for manufacturer assistance in case his insurance is not updated by the time he needs a refill.    Frank [name] Phone [phone] Fax [phone] 02/19/2021 11:28 AM

## 2021-02-20 ENCOUNTER — Other Ambulatory Visit (HOSPITAL_COMMUNITY): Payer: Self-pay | Admitting: Internal Medicine

## 2021-02-20 ENCOUNTER — Other Ambulatory Visit: Payer: Self-pay

## 2021-02-20 ENCOUNTER — Ambulatory Visit
Payer: Medicare Other | Attending: Student in an Organized Health Care Education/Training Program | Admitting: Student in an Organized Health Care Education/Training Program

## 2021-02-20 ENCOUNTER — Encounter: Payer: Self-pay | Admitting: Student in an Organized Health Care Education/Training Program

## 2021-02-20 VITALS — BP 131/81 | HR 88 | Temp 98.4°F | Resp 18 | Ht 68.0 in | Wt 188.0 lb

## 2021-02-20 DIAGNOSIS — M5136 Other intervertebral disc degeneration, lumbar region: Secondary | ICD-10-CM | POA: Diagnosis not present

## 2021-02-20 DIAGNOSIS — N189 Chronic kidney disease, unspecified: Secondary | ICD-10-CM | POA: Diagnosis not present

## 2021-02-20 DIAGNOSIS — F1410 Cocaine abuse, uncomplicated: Secondary | ICD-10-CM | POA: Diagnosis not present

## 2021-02-20 DIAGNOSIS — C9210 Chronic myeloid leukemia, BCR/ABL-positive, not having achieved remission: Secondary | ICD-10-CM | POA: Diagnosis present

## 2021-02-20 DIAGNOSIS — G894 Chronic pain syndrome: Secondary | ICD-10-CM | POA: Insufficient documentation

## 2021-02-20 MED ORDER — PREGABALIN 50 MG PO CAPS
50.0000 mg | ORAL_CAPSULE | Freq: Three times a day (TID) | ORAL | 1 refills | Status: AC
Start: 2021-02-20 — End: ?

## 2021-02-20 MED ORDER — METHOCARBAMOL 500 MG PO TABS
500.0000 mg | ORAL_TABLET | Freq: Three times a day (TID) | ORAL | 1 refills | Status: AC | PRN
Start: 2021-02-20 — End: ?

## 2021-02-20 MED ORDER — DASATINIB 100 MG PO TABS: 100.0000 mg | ORAL_TABLET | Freq: Every day | ORAL | 3 refills | Status: DC

## 2021-02-20 NOTE — Progress Notes (Signed)
Patient: Frank [name]  Service Category: E/M  Provider: Gillis Santa, MD  DOB: Nov 30, 1960  DOS: 02/20/2021  Referring Provider: Doyle Askew, MD  MRN: [id number]  Setting: Ambulatory outpatient  [facility]  Type: New Patient  Specialty: Interventional Pain Management    Location: Office  Delivery: Face-to-face     Primary Reason(s) for Visit: Encounter for initial evaluation of one or more chronic problems (new to examiner) potentially causing chronic pain, and posing a threat to normal musculoskeletal function. (Level of risk: High) CC: Back Pain (lower)  HPI  Mr. Filley is a 60 y.o. year old, male patient, who comes for the first time to our practice referred by Girtha Hake I, MD for our initial evaluation of his chronic pain. He has AKI (acute kidney injury) ([place]); Neutrophilia; Chronic myeloid leukemia ([place]); Chronic kidney disease; Hypertension; Hyperlipidemia; DM (diabetes mellitus), type 1, uncontrolled ([place]); and DDD (degenerative disc disease), lumbar on their problem list. Today he comes in for evaluation of his Back Pain (lower)  Pain Assessment: Location: Lower Back Radiating: both legs to the feet Onset: More than a month ago Duration: Chronic pain Quality: Aching,Dull Severity: 8 /10 (subjective, self-reported pain score)  Effect on ADL:   Timing: Constant Modifying factors: Tylenol, Flexeril, Gabapentin, TENS, chiropractic treatments BP: 131/81  HR: 88  Onset and Duration: Gradual and Present longer than 3 months Cause of pain: Work related accident or event Severity: Getting worse Timing: Not influenced by the time of the day Aggravating Factors: Bending and Kneeling Alleviating Factors: Hot packs, Lying down, Medications and TENS Associated Problems: Numbness and Spasms Quality of Pain: Constant and Sharp Previous Examinations or Tests: CT scan, MRI scan and Chiropractic evaluation Previous Treatments: Chiropractic manipulations and  TENS   Patient is a 60 year old male with a history of CML, polysubstance abuse (alcohol, cocaine), who presents with a chief complaint of back and bilateral leg pain.  This is been going on for over a year.  He endorses numbness and tingling in his legs.  Denies any bowel or bladder dysfunction.  He states that he has been trying to get into physical therapy by way of his [place] but has been unable to do so.  He is currently on Flexeril which he states is not beneficial.  He is also on gabapentin which he states is not beneficial.  He states that he is not interested in pursuing any injections.  He was very honest about his drug use and states that he used cocaine approximately 2 weeks ago.  I was very clear with the patient that we will focus on nonopioid analgesics in the management of his chronic pain given his history of liver disease, pancreatitis secondary to alcohol abuse and active cocaine abuse.  Patient endorsed understanding.  I also informed the patient that he should consider a lumbar epidural steroid injection for his symptoms but the patient adamantly declined any sort of injections in his spine.   Historic Controlled Substance Pharmacotherapy Review  Historical Monitoring: The patient  reports current drug use. Drugs: Cocaine and Marijuana. List of all UDS Test(s): Lab Results  Component Value Date   MDMA NONE DETECTED 01/22/2021   MDMA NEGATIVE 09/13/2014   COCAINSCRNUR POSITIVE (A) 01/22/2021   COCAINSCRNUR POSITIVE 09/13/2014   PCPSCRNUR NONE DETECTED 01/22/2021   PCPSCRNUR NEGATIVE 09/13/2014   THCU POSITIVE (A) 01/22/2021   THCU NEGATIVE 09/13/2014   ETH <10 01/21/2021   List of other Serum/Urine Drug Screening Test(s):  Lab Results  Component Value Date   COCAINSCRNUR POSITIVE (A) 01/22/2021   COCAINSCRNUR POSITIVE 09/13/2014   THCU POSITIVE (A) 01/22/2021   THCU NEGATIVE 09/13/2014   ETH <10 01/21/2021   Historical Background Evaluation: [place] PMP: PDMP  reviewed during this encounter. Online review of the past 37-month period conducted.             [place] Department of public safety, offender search: Engineer, mining Information) Non-contributory Risk Assessment Profile: Aberrant behavior: non-adherence to medication regimen and use of illicit substances Risk factors for fatal opioid overdose: history of alcoholism, history of drug addiction, history of non-compliance with medical advice, history of substance abuse, history of substance use disorder, liver disease and male gender Fatal overdose hazard ratio (HR): Calculation deferred Non-fatal overdose hazard ratio (HR): Calculation deferred Risk of opioid abuse or dependence: 0.7-3.0% with doses ? 36 MME/day and 6.1-26% with doses ? 120 MME/day. Substance use disorder (SUD) risk level: See below Personal History of Substance Abuse (SUD-Substance use disorder):  Alcohol: Positive Male or Male  Illegal Drugs: Positive Male or Male  Rx Drugs: Positive Male or Male  ORT Risk Level calculation: High Risk  Opioid Risk Tool - 02/20/21 1312      Family History of Substance Abuse   Alcohol Positive Male    Illegal Drugs Positive Male    Rx Drugs Positive Male or Male      Personal History of Substance Abuse   Alcohol Positive Male or Male    Illegal Drugs Positive Male or Male    Rx Drugs Positive Male or Male      Age   Age between 16-45 years  No      History of Preadolescent Sexual Abuse   History of Preadolescent Sexual Abuse Negative or Male      Psychological Disease   Psychological Disease Negative    Depression Negative      Total Score   Opioid Risk Tool Scoring 22    Opioid Risk Interpretation High Risk          ORT Scoring interpretation table:  Score <3 = Low Risk for SUD  Score between 4-7 = Moderate Risk for SUD  Score >8 = High Risk for Opioid Abuse   PHQ-2 Depression Scale:  Total score: 0  PHQ-2 Scoring interpretation table: (Score and probability of major  depressive disorder)  Score 0 = No depression  Score 1 = 15.4% Probability  Score 2 = 21.1% Probability  Score 3 = 38.4% Probability  Score 4 = 45.5% Probability  Score 5 = 56.4% Probability  Score 6 = 78.6% Probability   PHQ-9 Depression Scale:  Total score: 0  PHQ-9 Scoring interpretation table:  Score 0-4 = No depression  Score 5-9 = Mild depression  Score 10-14 = Moderate depression  Score 15-19 = Moderately severe depression  Score 20-27 = Severe depression (2.4 times higher risk of SUD and 2.89 times higher risk of overuse)   Pharmacologic Plan: No opioid analgesics.            Initial impression: High risk for opiate therapy.  Meds   Current Outpatient Medications:  .  amLODipine (NORVASC) 10 MG tablet, Take 0.5 tablets by mouth daily., Disp: , Rfl:  .  aspirin 81 MG EC tablet, Take 1 tablet by mouth daily. With a meal, Disp: , Rfl:  .  Brinzolamide-Brimonidine 1-0.2 % SUSP, Place 1 drop into both eyes in the morning, at noon, and at bedtime., Disp: , Rfl:  .  Cholecalciferol  25 MCG (1000 UT) tablet, Take 2 tablets by mouth daily., Disp: , Rfl:  .  dasatinib (SPRYCEL) 100 MG tablet, Take 1 tablet (100 mg total) by mouth daily., Disp: 30 tablet, Rfl: 3 .  insulin aspart (NOVOLOG) 100 UNIT/ML injection, Inject 100 Units into the skin continuous. INJECT 100 UNITS UNDER SKIN CONTINUOUS-VIA-PUMP AS DIRECTED FOR DIABETES.DISCARD BOTTLE 28 DAYS AFTER OPENING, Disp: , Rfl:  .  insulin glargine (LANTUS) 100 UNIT/ML injection, INJECT 12 UNITS UNDER SKIN EVERY DAY FOR DIABETES MELLITUS DO NOT MIX WITH OTHER INSULINS IN THE SAME SYRINGE. DISCARD BOTTLE 28 DAYS AFTER OPENING  TO REPLACE BASAL INSULIN RATE IF PUMP MALFUNCTIONS FOR DIABETES MELLITUS DO NOT MIX WITH OTHER INSULINS IN THE SAME SYRINGE. DISCARD BOTTLE 28 DAYS AFTER OPENING  TO REPLACE BASAL INSULIN RATE IF PUMP MALFUNCTIONS, Disp: , Rfl:  .  latanoprost (XALATAN) 0.005 % ophthalmic solution, Place 1 drop into both eyes at  bedtime., Disp: , Rfl:  .  lipase/protease/amylase (CREON) 36000 UNITS CPEP capsule, See admin instructions. TAKE 2 CAPSULES BY MOUTH WITH ACTIVE MEALS AND TAKE 1 CAPSULE WITH SNACK, Disp: , Rfl:  .  lisinopril-hydrochlorothiazide (ZESTORETIC) 20-12.5 MG tablet, Take 1 tablet by mouth daily., Disp: , Rfl:  .  methocarbamol (ROBAXIN) 500 MG tablet, Take 1 tablet (500 mg total) by mouth every 8 (eight) hours as needed for muscle spasms., Disp: 90 tablet, Rfl: 1 .  nicotine (NICODERM CQ - DOSED IN MG/24 HOURS) 14 mg/24hr patch, Place 14 mg onto the skin daily. APPLY ONE PATCH TO SKIN EVERY DAY ACTIVE APPLY ONE PATCH WHEN YOU WAKE UP AND REMOVE THE OLD PATCH. PEEL THE BACK OFF THE PATCH AND PUT IT ON CLEAN,DRY, HAIR-FREE SKIN ON THE UPPER ARM, CHEST OR BACK, Disp: , Rfl:  .  nicotine polacrilex (COMMIT) 4 MG lozenge, Take 4 mg by mouth as needed. DISSOLVE 1 LOZENGE BY MOUTH AS DIRECTED - USE AT LEAST 8-10 LOZENGES EACH DAY TO START. DO NOT USE MORE THAN 20 LOZENGES, Disp: , Rfl:  .  omeprazole (PRILOSEC) 20 MG capsule, Take 20 mg by mouth daily before breakfast., Disp: , Rfl:  .  pregabalin (LYRICA) 50 MG capsule, Take 1 capsule (50 mg total) by mouth 3 (three) times daily., Disp: 90 capsule, Rfl: 1 .  rosuvastatin (CRESTOR) 20 MG tablet, Take 10 mg by mouth daily., Disp: , Rfl:  .  timolol (TIMOPTIC) 0.5 % ophthalmic solution, Place 1 drop into both eyes 2 (two) times daily., Disp: , Rfl:  .  capsaicin (ZOSTRIX) 0.025 % cream, Apply 1 application topically daily as needed. APPLY SUFFICIENT AMOUNT TOPICALLY ACTIVE EVERY DAY AVOID GETTING ON FACE, IN EYES OR ON SENSITIVE AREAS. [place] HANDS THOROUGHLY AFTER USING. (Patient not taking: No sig reported), Disp: , Rfl:   Imaging Review   Lumbosacral Imaging: Lumbar MR wo contrast: Results for orders placed during the hospital encounter of 12/07/20  MR LUMBAR SPINE WO CONTRAST  Addendum 12/07/2020  3:20 PM ADDENDUM REPORT: 12/07/2020 15:18  ADDENDUM: Not  included in the initial impression, there is decreased T1 marrow signal that may reflect hematopoietic marrow or other marrow infiltration.   Electronically Signed By: Macy Mis M.D. On: 12/07/2020 15:18  Narrative CLINICAL DATA:  Pain in right leg to calf, numbness in foot, fall December 6  EXAM: MRI LUMBAR SPINE WITHOUT CONTRAST  TECHNIQUE: Multiplanar, multisequence MR imaging of the lumbar spine was performed. No intravenous contrast was administered.  COMPARISON:  January 2016  FINDINGS: Segmentation:  Standard.  Alignment:  Anteroposterior alignment is preserved.  Vertebrae: Decreased T1 marrow signal. No marrow edema. No suspicious osseous lesion.  Conus medullaris and cauda equina: Conus extends to the L1 level. Conus and cauda equina appear normal.  Paraspinal and other soft tissues: Unremarkable.  Disc levels: Minimal disc bulge is imaged in the sagittal plane only at the T11-T12 level.  L1-L2:  No canal or foraminal stenosis.  L2-L3:  No canal or foraminal stenosis.  L3-L4:  No canal or foraminal stenosis.  L4-L5: Disc bulge with endplate osteophytic ridging. Facet arthropathy with ligamentum flavum infolding. No canal stenosis. Minor right and mild left foraminal stenosis. Appearance is similar.  L5-S1: Disc bulge with endplate osteophytic ridging. Facet arthropathy, right greater than left, with suspected subcentimeter synovial cyst encroaching on the right neural foramen (series 8, image 29). No canal stenosis. Mild right and minor left foraminal stenosis. Appearance is similar.  IMPRESSION: Lower lumbar degenerative changes as detailed above similar to 2016. There is a suspected subcentimeter synovial cyst encroaching on the right L5-S1 neural foramen posteriorly. Mild degree of foraminal narrowing is not substantially changed, may be contributing to reported right-sided symptoms.  Electronically Signed: By: Macy Mis M.D. On:  12/07/2020 15:14  Complexity Note: Imaging results reviewed. Results shared with Mr. Carmer, using Layman's terms.                         ROS  Cardiovascular: High blood pressure Pulmonary or Respiratory: No reported pulmonary signs or symptoms such as wheezing and difficulty taking a deep full breath (Asthma), difficulty blowing air out (Emphysema), coughing up mucus (Bronchitis), persistent dry cough, or temporary stoppage of breathing during sleep Neurological: No reported neurological signs or symptoms such as seizures, abnormal skin sensations, urinary and/or fecal incontinence, being born with an abnormal open spine and/or a tethered spinal cord Psychological-Psychiatric: No reported psychological or psychiatric signs or symptoms such as difficulty sleeping, anxiety, depression, delusions or hallucinations (schizophrenial), mood swings (bipolar disorders) or suicidal ideations or attempts Gastrointestinal: Reflux or heatburn and Inflamed pancreas (Pancreatitis) Genitourinary: No reported renal or genitourinary signs or symptoms such as difficulty voiding or producing urine, peeing blood, non-functioning kidney, kidney stones, difficulty emptying the bladder, difficulty controlling the flow of urine, or chronic kidney disease Hematological: No reported hematological signs or symptoms such as prolonged bleeding, low or poor functioning platelets, bruising or bleeding easily, hereditary bleeding problems, low energy levels due to low hemoglobin or being anemic Endocrine: High blood sugar controlled without the use of insulin (NIDDM) Rheumatologic: No reported rheumatological signs and symptoms such as fatigue, joint pain, tenderness, swelling, redness, heat, stiffness, decreased range of motion, with or without associated rash Musculoskeletal: Negative for myasthenia gravis, muscular dystrophy, multiple sclerosis or malignant hyperthermia Work History: Retired  Allergies  Mr. Seibert has No  Known Allergies.  Laboratory Chemistry Profile   Renal Lab Results  Component Value Date   BUN 22 (H) 01/31/2021   CREATININE 1.22 01/31/2021   GFRAA >60 09/15/2014   GFRNONAA >60 01/31/2021   PROTEINUR 30 (A) 01/22/2021     Electrolytes Lab Results  Component Value Date   NA 137 01/31/2021   K 4.3 01/31/2021   CL 105 01/31/2021   CALCIUM 9.3 01/31/2021   MG 1.8 01/23/2021   PHOS 3.2 01/23/2021     Hepatic Lab Results  Component Value Date   AST 18 01/31/2021   ALT 16 01/31/2021   ALBUMIN 4.3 01/31/2021   ALKPHOS 66 01/31/2021  LIPASE < 10 (L) 09/13/2014     ID Lab Results  Component Value Date   HIV Non Reactive 01/22/2021   [place] NEGATIVE 01/21/2021     Bone No results found for: VD25OH, [id number], BT5176HY0, VP7106YI9, 25OHVITD1, 25OHVITD2, 25OHVITD3, TESTOFREE, TESTOSTERONE   Endocrine Lab Results  Component Value Date   GLUCOSE 153 (H) 01/31/2021   GLUCOSEU 50 (A) 01/22/2021     Neuropathy Lab Results  Component Value Date   HIV Non Reactive 01/22/2021     CNS No results found for: COLORCSF, APPEARCSF, RBCCOUNTCSF, WBCCSF, POLYSCSF, LYMPHSCSF, EOSCSF, PROTEINCSF, GLUCCSF, JCVIRUS, CSFOLI, IGGCSF, LABACHR, ACETBL, LABACHR, ACETBL   Inflammation (CRP: Acute  ESR: Chronic) Lab Results  Component Value Date   CRP 0.8 01/22/2021   LATICACIDVEN 1.0 01/22/2021     Rheumatology No results found for: RF, ANA, LABURIC, URICUR, LYMEIGGIGMAB, LYMEABIGMQN, HLAB27   Coagulation Lab Results  Component Value Date   INR 1.1 01/22/2021   LABPROT 13.8 01/22/2021   APTT <24 (L) 01/22/2021   PLT 355 02/05/2021     Cardiovascular Lab Results  Component Value Date   TROPONINI < 0.02 09/13/2014   HGB 12.3 (L) 02/05/2021   HCT 37.6 (L) 02/05/2021     Screening Lab Results  Component Value Date   SARSCOV2NAA NEGATIVE 01/21/2021   HIV Non Reactive 01/22/2021     Cancer No results found for: CEA, CA125, LABCA2   Allergens No results found  for: ALMOND, APPLE, ASPARAGUS, AVOCADO, BANANA, BARLEY, BASIL, BAYLEAF, GREENBEAN, LIMABEAN, WHITEBEAN, BEEFIGE, REDBEET, BLUEBERRY, BROCCOLI, CABBAGE, MELON, CARROT, CASEIN, CASHEWNUT, CAULIFLOWER, CELERY     Note: Lab results reviewed.  [place]  Drug: Mr. Therien  reports current drug use. Drugs: Cocaine and Marijuana. Alcohol:  reports current alcohol use. Tobacco:  reports that he has been smoking cigarettes. He has never used smokeless tobacco. 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 Family: family history includes Alcohol abuse in his father; Cancer in his sister; Diabetes in his mother; Emphysema in his father; Fibromyalgia in his sister; Gout in his father; Heart failure in his mother; Hyperlipidemia in his mother and sister; Hypertension in his mother and sister; Kidney failure in his father; Rheum arthritis in his sister; Thyroid disease in his mother and sister.  No past surgical history on file. Active Ambulatory Problems    Diagnosis Date Noted  . AKI (acute kidney injury) ([place]) 01/22/2021  . Neutrophilia 01/22/2021  . Chronic myeloid leukemia ([place]) 01/31/2021  . Chronic kidney disease 01/31/2021  . Hypertension 01/31/2021  . Hyperlipidemia 01/31/2021  . DM (diabetes mellitus), type 1, uncontrolled ([place]) 01/31/2021  . DDD (degenerative disc disease), lumbar 12/04/2014   Resolved Ambulatory Problems    Diagnosis Date Noted  . No Resolved Ambulatory Problems   Past Medical History:  Diagnosis Date  . Arthritis   . Chronic low back pain with sciatica   . CKD (chronic kidney disease)   . Diabetes mellitus without complication ([place])   . Hemorrhoids   . History of pancreatitis   . 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   Weight as of this encounter: 188 lb (85.3 kg).  BMI interpretation table: BMI level Category Range association with higher incidence of chronic pain  <18 kg/m2 Underweight   18.5-24.9 kg/m2 Ideal body weight   25-29.9 kg/m2 Overweight Increased incidence by 20%  30-34.9 kg/m2 Obese (Class I) Increased incidence by 68%  35-39.9 kg/m2 Severe obesity (Class II) Increased incidence by 136%  >40 kg/m2 Extreme obesity (Class III) Increased incidence by 254%   Patient's current BMI Ideal Body weight  Body mass index is 28.59 kg/m. Ideal body weight: 68.4 kg (150 lb 12.7 oz) Adjusted ideal body weight: 75.1 kg (165 lb 10.8 oz)   BMI Readings from Last 4 Encounters:  02/20/21 28.59 kg/m  02/19/21 28.31 kg/m  02/05/21 27.67 kg/m  01/31/21 27.73 kg/m   Wt Readings from Last 4 Encounters:  02/20/21 188 lb (85.3 kg)  02/19/21 186 lb 3.2 oz (84.5 kg)  02/05/21 182 lb (82.6 kg)  01/31/21 182 lb 6 oz (82.7 kg)    Psych/Mental status: Alert, oriented x 3 (person, place, & time)       Eyes: PERLA Respiratory: No evidence of acute respiratory distress   Lumbar Exam  Skin & Axial Inspection: No masses, redness, or swelling Alignment: Symmetrical Functional ROM: Pain restricted ROM       Stability: No instability detected Muscle Tone/Strength: Functionally intact. No obvious neuro-muscular anomalies detected. Sensory (Neurological): Dermatomal pain pattern   Gait & Posture Assessment  Ambulation: Limited Gait: Antalgic Posture: Difficulty standing up straight, due to pain   Lower Extremity Exam    Side: Right lower  extremity  Side: Left lower extremity  Stability: No instability observed          Stability: No instability observed          Skin & Extremity Inspection: Skin color, temperature, and hair growth are WNL. No peripheral edema or cyanosis. No masses, redness, swelling, asymmetry, or associated skin lesions. No contractures.  Skin & Extremity Inspection: Skin color, temperature, and hair growth are WNL. No peripheral edema or cyanosis. No masses, redness, swelling, asymmetry, or associated skin lesions. No contractures.  Functional ROM: Pain restricted ROM for all joints of the lower extremity          Functional ROM: Pain restricted ROM for all joints of the lower extremity          Muscle Tone/Strength: Functionally intact. No obvious neuro-muscular anomalies detected.  Muscle Tone/Strength: Functionally intact. No obvious neuro-muscular anomalies detected.  Sensory (Neurological): Dermatomal pain pattern        Sensory (Neurological): Dermatomal pain pattern        DTR: Patellar: deferred today Achilles: deferred today Plantar: deferred today  DTR: Patellar: deferred today Achilles: deferred today Plantar: deferred today  Palpation: No palpable anomalies  Palpation: No palpable anomalies   Assessment  Primary Diagnosis & Pertinent Problem List: The primary encounter diagnosis was Chronic myeloid leukemia ([place]). Diagnoses of Chronic kidney disease, unspecified CKD stage, DDD (degenerative disc disease), lumbar, Cocaine abuse ([place]), and Chronic pain syndrome were also pertinent to this visit.  Visit Diagnosis (New problems to examiner): 1. Chronic myeloid leukemia ([place])   2. Chronic kidney disease, unspecified CKD stage   3. DDD (degenerative disc disease), lumbar   4. Cocaine abuse ([place])   5. Chronic  pain syndrome    Plan of Care (Initial workup plan)  Patient is high risk for opioid prescribing. The patient will not be a candidate for opioid therapy through this clinic. Patient will be  optimized on non-opioid adjunctive analgesics and a comprehensive pain management plan was developed.   Pharmacotherapy (current): Medications ordered:  Meds ordered this encounter  Medications  . pregabalin (LYRICA) 50 MG capsule    Sig: Take 1 capsule (50 mg total) by mouth 3 (three) times daily.    Dispense:  90 capsule    Refill:  1    Fill one day early if pharmacy is closed on scheduled refill date. May substitute for generic if available.  . methocarbamol (ROBAXIN) 500 MG tablet    Sig: Take 1 tablet (500 mg total) by mouth every 8 (eight) hours as needed for muscle spasms.    Dispense:  90 tablet    Refill:  1    Do not place this medication, or any other prescription from our practice, on "Automatic Refill". Patient may have prescription filled one day early if pharmacy is closed on scheduled refill date.   Medications administered during this visit: Gaspard L. Granja had no medications administered during this visit.   Pharmacological management options:  Opioid Analgesics: N/A in the context of cocaine abuse, alcohol abuse  Membrane stabilizer: Has tried and failed gabapentin.  Trial of Lyrica as above  Muscle relaxant: Tried and failed Flexeril.  Trial of Robaxin as above.  NSAID: To be determined at a later time  Other analgesic(s): To be determined at a later time   Interventional management options: Mr. Charlot was informed that there is no guarantee that he would be a candidate for interventional therapies. The decision will be based on the results of diagnostic studies, as well as Mr. Kauk risk profile.  Procedure(s) under consideration:  Lumbar epidural steroid injection however patient is not interested.   Provider-requested follow-up: Return in about 8 weeks (around 04/17/2021) for Medication Management, virtual.  Future Appointments  Date Time Provider [place]  03/12/2021 10:00 AM Cammie Sickle, MD [facility] None  04/12/2021  3:40 PM  Gillis Santa, MD [facility] None    Note by: Gillis Santa, MD Date: 02/20/2021; Time: 2:31 PM

## 2021-02-20 NOTE — Progress Notes (Signed)
Safety precautions to be maintained throughout the outpatient stay will include: orient to surroundings, keep bed in low position, maintain call bell within reach at all times, provide assistance with transfer out of bed and ambulation.  

## 2021-02-21 LAB — SURGICAL PATHOLOGY

## 2021-02-23 ENCOUNTER — Telehealth: Payer: Self-pay | Admitting: Pharmacy Technician

## 2021-02-23 MED FILL — SPRYCEL 100 MG TABLET: 100 | 30 days supply | Qty: 30 | Fill #0

## 2021-02-23 NOTE — Telephone Encounter (Signed)
Oral Oncology Patient Advocate Encounter  Patient's insurance at 3/28 appointment only covered Part B drugs.  I spoke with patient at his appt and asked him to call his plan to see if he could get Part D to cover prescription drugs.  Patient called me after the appt and said his plan would be changed on 4/1.  After completing a benefits investigation today, prior authorization for Sprycel is not required at this time through [facility].  Patient's copay is $0.00.    [place] Patient [place] Phone [phone] Fax [phone] 02/23/2021 8:47 AM

## 2021-02-23 NOTE — Telephone Encounter (Addendum)
Erroneous encounter

## 2021-02-23 NOTE — Telephone Encounter (Signed)
Oral Oncology Patient Advocate Encounter  I spoke with Frank [name] this morning to set up delivery of Sprycel.  Address verified for shipment.  Sprycel will be filled through [facility] and mailed 4/1 for delivery 4/2 or 4/5.    Hazel will call 7-10 days before next refill is due to complete adherence call and set up delivery of medication.     [place] Patient [place] Phone [phone] Fax [phone] 02/23/2021 10:49 AM

## 2021-03-12 ENCOUNTER — Inpatient Hospital Stay: Payer: Medicare Other | Admitting: Pharmacist

## 2021-03-12 ENCOUNTER — Inpatient Hospital Stay: Payer: Medicare Other | Attending: Internal Medicine | Admitting: Internal Medicine

## 2021-03-12 VITALS — BP 113/67 | HR 58 | Temp 97.9°F | Resp 16 | Wt 187.2 lb

## 2021-03-12 DIAGNOSIS — F1410 Cocaine abuse, uncomplicated: Secondary | ICD-10-CM | POA: Diagnosis not present

## 2021-03-12 DIAGNOSIS — Z794 Long term (current) use of insulin: Secondary | ICD-10-CM | POA: Diagnosis not present

## 2021-03-12 DIAGNOSIS — C9210 Chronic myeloid leukemia, BCR/ABL-positive, not having achieved remission: Secondary | ICD-10-CM

## 2021-03-12 DIAGNOSIS — R140 Abdominal distension (gaseous): Secondary | ICD-10-CM | POA: Diagnosis not present

## 2021-03-12 DIAGNOSIS — E119 Type 2 diabetes mellitus without complications: Secondary | ICD-10-CM | POA: Insufficient documentation

## 2021-03-12 DIAGNOSIS — M1990 Unspecified osteoarthritis, unspecified site: Secondary | ICD-10-CM | POA: Diagnosis not present

## 2021-03-12 DIAGNOSIS — E785 Hyperlipidemia, unspecified: Secondary | ICD-10-CM | POA: Diagnosis not present

## 2021-03-12 DIAGNOSIS — F17210 Nicotine dependence, cigarettes, uncomplicated: Secondary | ICD-10-CM | POA: Diagnosis not present

## 2021-03-12 DIAGNOSIS — I129 Hypertensive chronic kidney disease with stage 1 through stage 4 chronic kidney disease, or unspecified chronic kidney disease: Secondary | ICD-10-CM | POA: Insufficient documentation

## 2021-03-12 DIAGNOSIS — K709 Alcoholic liver disease, unspecified: Secondary | ICD-10-CM | POA: Diagnosis not present

## 2021-03-12 DIAGNOSIS — Z7982 Long term (current) use of aspirin: Secondary | ICD-10-CM | POA: Insufficient documentation

## 2021-03-12 DIAGNOSIS — M549 Dorsalgia, unspecified: Secondary | ICD-10-CM | POA: Diagnosis not present

## 2021-03-12 DIAGNOSIS — Z79899 Other long term (current) drug therapy: Secondary | ICD-10-CM | POA: Insufficient documentation

## 2021-03-12 DIAGNOSIS — R143 Flatulence: Secondary | ICD-10-CM | POA: Diagnosis not present

## 2021-03-12 DIAGNOSIS — G8929 Other chronic pain: Secondary | ICD-10-CM | POA: Diagnosis not present

## 2021-03-12 DIAGNOSIS — I499 Cardiac arrhythmia, unspecified: Secondary | ICD-10-CM

## 2021-03-12 NOTE — Progress Notes (Signed)
[place] NOTE  Patient Care Team: Administration, Veterans as PCP - General  CHIEF COMPLAINTS/PURPOSE OF CONSULTATION: CML  #  Oncology History Overview Note  Result Comment:   Comment: (NOTE)  ABNORMAL: 89% OF NUCLEI POSITIVE FOR BCR/ABL1 GENE FUSION  SIGNAL(S)     # MARCH 2022--  Hypercellular bone marrow with involvement by chronic myeloid  leukemia, BCR-ABL1-positive . he bone marrow is hypercellular for age with myeloid and megakaryocytic  hyperplasia.  There is no increase in blasts by manual differential count of aspirate smears (<1%) or by CD34 immunohistochemistry on the core biopsy.  A reticulin special stain reveals no significant increase  in reticulin fibrosis (MF grade 0 of 3).  # April 1st week -DASATINIB 100 mg    #hypertension /alcoholic liver/ type 2 diabetes on insulin poorly controlled chronic back pain    Chronic myeloid leukemia ([place])  01/31/2021 Initial Diagnosis   Chronic myeloid leukemia (HCC)      HISTORY OF PRESENTING ILLNESS:  Frank [name] 60 y.o.  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  Patient approximately 2 weeks ago started on Dasatinib 100 mg a day.  Patient had to stop his PPI because of interaction with Dasatinib.  He complains of more flatulence.  Also complains of abdominal bloating.  Complains of fatigue.  Also complains of arthralgias.   Review of Systems  Constitutional: Positive for malaise/fatigue. Negative for chills, diaphoresis, fever and weight loss.  HENT: Negative for nosebleeds and sore throat.   Eyes: Negative for double vision.  Respiratory: Negative for cough, hemoptysis, sputum production, shortness of breath and wheezing.   Cardiovascular: Negative for chest pain, palpitations, orthopnea and leg swelling.  Gastrointestinal: Negative for abdominal pain, blood in stool, constipation, diarrhea, heartburn, melena, nausea and vomiting.  Genitourinary: Negative for dysuria, frequency  and urgency.  Musculoskeletal: Positive for back pain and joint pain.  Skin: Negative.  Negative for itching and rash.  Neurological: Negative for dizziness, tingling, focal weakness, weakness and headaches.  Endo/Heme/Allergies: Does not bruise/bleed easily.  Psychiatric/Behavioral: Negative for depression. The patient is not nervous/anxious and does not have insomnia.      MEDICAL HISTORY:  Past Medical History:  Diagnosis Date  . Arthritis   . Chronic low back pain with sciatica   . CKD (chronic kidney disease)    unknown stage  . Diabetes mellitus without complication (HCC)    insulin pump therapy  . Hemorrhoids   . History of pancreatitis   . Hyperlipidemia   . Hypertension   . 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  SOCIAL HISTORY: Social History   Socioeconomic History  . Marital status: Single    Spouse name: Not on file  . Number of children: Not on file  . Years of education: Not on file  . Highest education level: Not on file  Occupational History  . Occupation: retired     Comment: custodian   Tobacco Use  . Smoking status: Current Some Day Smoker    Types: Cigarettes  . Smokeless tobacco: Never Used  . Tobacco comment: 4 cigarettes yesterday   Substance and Sexual Activity  . Alcohol use: Yes    Comment: hx of ETOH abuse  . Drug use: Yes    Types: Cocaine, Marijuana    Comment: had cocaine Sat. (2 days ago)  . Sexual activity: Not on file  Other Topics Concern  . Not on  file  Social History Narrative   Lives with S.O. [name]    Social Determinants of Health   Financial Resource Strain: Not on file  Food Insecurity: Not on file  Transportation Needs: Not on file  Physical Activity: Not on file  Stress: Not on file  Social Connections: Not on file  Intimate Partner Violence: Not on file    FAMILY HISTORY: Family History  Problem Relation Age of Onset  . Diabetes Mother   .  Hypertension Mother   . Heart failure Mother   . Hyperlipidemia Mother   . Thyroid disease Mother   . Alcohol abuse Father   . Emphysema Father   . Kidney failure Father   . Gout Father   . Cancer Sister   . Hyperlipidemia Sister   . Hypertension Sister   . Rheum arthritis Sister   . Fibromyalgia Sister   . Thyroid disease Sister     ALLERGIES:  has No Known Allergies.  MEDICATIONS:  Current Outpatient Medications  Medication Sig Dispense Refill  . amLODipine (NORVASC) 10 MG tablet Take 0.5 tablets by mouth daily.    Marland Kitchen aspirin 81 MG EC tablet Take 1 tablet by mouth daily. With a meal    . Brinzolamide-Brimonidine 1-0.2 % SUSP Place 1 drop into both eyes in the morning, at noon, and at bedtime.    . capsaicin (ZOSTRIX) 0.025 % cream Apply 1 application topically daily as needed. APPLY SUFFICIENT AMOUNT TOPICALLY ACTIVE EVERY DAY AVOID GETTING ON FACE, IN EYES OR ON SENSITIVE AREAS. Tobias HANDS THOROUGHLY AFTER USING.    Marland Kitchen Cholecalciferol 25 MCG (1000 UT) tablet Take 2 tablets by mouth daily.    . dasatinib (SPRYCEL) 100 MG tablet Take 1 tablet (100 mg total) by mouth daily. 30 tablet 3  . dasatinib (SPRYCEL) 100 MG tablet TAKE 1 TABLET (100 MG TOTAL) BY MOUTH DAILY. 30 tablet 3  . insulin aspart (NOVOLOG) 100 UNIT/ML injection Inject 100 Units into the skin continuous. INJECT 100 UNITS UNDER SKIN CONTINUOUS-VIA-PUMP AS DIRECTED FOR DIABETES.DISCARD BOTTLE 28 DAYS AFTER OPENING    . insulin glargine (LANTUS) 100 UNIT/ML injection INJECT 12 UNITS UNDER SKIN EVERY DAY FOR DIABETES MELLITUS DO NOT MIX WITH OTHER INSULINS IN THE SAME SYRINGE. DISCARD BOTTLE 28 DAYS AFTER OPENING  TO REPLACE BASAL INSULIN RATE IF PUMP MALFUNCTIONS FOR DIABETES MELLITUS DO NOT MIX WITH OTHER INSULINS IN THE SAME SYRINGE. DISCARD BOTTLE 28 DAYS AFTER OPENING  TO REPLACE BASAL INSULIN RATE IF PUMP MALFUNCTIONS    . latanoprost (XALATAN) 0.005 % ophthalmic solution Place 1 drop into both eyes at bedtime.    .  lipase/protease/amylase (CREON) 36000 UNITS CPEP capsule See admin instructions. TAKE 2 CAPSULES BY MOUTH WITH ACTIVE MEALS AND TAKE 1 CAPSULE WITH SNACK    . lisinopril-hydrochlorothiazide (ZESTORETIC) 20-12.5 MG tablet Take 1 tablet by mouth daily.    . methocarbamol (ROBAXIN) 500 MG tablet Take 1 tablet (500 mg total) by mouth every 8 (eight) hours as needed for muscle spasms. 90 tablet 1  . nicotine (NICODERM CQ - DOSED IN MG/24 HOURS) 14 mg/24hr patch Place 14 mg onto the skin daily. APPLY ONE PATCH TO SKIN EVERY DAY ACTIVE APPLY ONE PATCH WHEN YOU WAKE UP AND REMOVE THE OLD PATCH. PEEL THE BACK OFF THE PATCH AND PUT IT ON CLEAN,DRY, HAIR-FREE SKIN ON THE UPPER ARM, CHEST OR BACK    . nicotine polacrilex (COMMIT) 4 MG lozenge Take 4 mg by mouth as needed. DISSOLVE 1 LOZENGE BY MOUTH  AS DIRECTED - USE AT LEAST 8-10 LOZENGES EACH DAY TO START. DO NOT USE MORE THAN 20 LOZENGES    . pregabalin (LYRICA) 50 MG capsule Take 1 capsule (50 mg total) by mouth 3 (three) times daily. 90 capsule 1  . rosuvastatin (CRESTOR) 20 MG tablet Take 10 mg by mouth daily.    . timolol (TIMOPTIC) 0.5 % ophthalmic solution Place 1 drop into both eyes 2 (two) times daily.     No current facility-administered medications for this visit.      Marland Kitchen  PHYSICAL EXAMINATION: ECOG PERFORMANCE STATUS: 0 - Asymptomatic  Vitals:   03/12/21 1000  BP: 113/67  Pulse: (!) 58  Resp: 16  Temp: 97.9 F (36.6 C)  SpO2: 100%   Filed Weights   03/12/21 1000  Weight: 187 lb 3 oz (84.9 kg)    Physical Exam Constitutional:      Comments: Patient ambulating independently.  He is accompanied by wife/significant other.  HENT:     Head: Normocephalic and atraumatic.     Mouth/Throat:     Pharynx: No oropharyngeal exudate.  Eyes:     Pupils: Pupils are equal, round, and reactive to light.  Cardiovascular:     Rate and Rhythm: Normal rate and regular rhythm.  Pulmonary:     Effort: No respiratory distress.     Breath  sounds: No wheezing.     Comments: Decreased air entry bilaterally. Abdominal:     General: Bowel sounds are normal. There is no distension.     Palpations: Abdomen is soft. There is no mass.     Tenderness: There is no abdominal tenderness. There is no guarding or rebound.  Musculoskeletal:        General: No tenderness. Normal range of motion.     Cervical back: Normal range of motion and neck supple.  Skin:    General: Skin is warm.  Neurological:     Mental Status: He is alert and oriented to person, place, and time.  Psychiatric:        Mood and Affect: Affect normal.      LABORATORY DATA:  I have reviewed the data as listed Lab Results  Component Value Date   WBC 24.7 (H) 02/05/2021   HGB 12.3 (L) 02/05/2021   HCT 37.6 (L) 02/05/2021   MCV 90.6 02/05/2021   PLT 355 02/05/2021   Recent Labs    01/21/21 2255 01/22/21 0629 01/23/21 0445 01/31/21 1436  NA 135 134* 138 137  K 5.1 4.3 3.6 4.3  CL 107 105 110 105  CO2 21* 20* 22 23  GLUCOSE 186* 240* 93 153*  BUN 22* 18 14 22*  CREATININE 1.89* 1.29* 0.96 1.22  CALCIUM 9.3 8.7* 8.5* 9.3  GFRNONAA 40* >60 >60 >60  PROT 7.1  --  6.0* 7.6  ALBUMIN 4.0  --  3.5 4.3  AST 32  --  16 18  ALT 20  --  15 16  ALKPHOS 66  --  55 66  BILITOT 0.7  --  0.4 0.5    RADIOGRAPHIC STUDIES: I have personally reviewed the radiological images as listed and agreed with the findings in the report. No results found.  ASSESSMENT & PLAN:   Chronic myeloid leukemia ([place]) #Chronic myeloid leukemia- on dasatinib 100 mg/day [1st week of April, 2022].  Tolerating well except for abdominal bloating/flatulence [see below].  Repeat EKG today shows no evidence of any QT prolongation.  Continue current dose of Dasatinib.  Discussed with Ebony Hail.  #  Abdominal bloating/flatulence-patient currently off PPI/H2 blocker because of interaction with Dasatinib.  Recommend simethicone.  #Substance drug abuse-March 2022-positive for cocaine  [[facility]]-counseled the patient to quit illicit drugs.  Reminded the patient will avoid any illicit drugs.  #Diabetes-poorly controlled; currently on insulin.  Stable monitor closely.  #Jury duty: Given patient multiple complaints-it would be reasonable to excuse patient from jury duty.  Letter given.  # DISPOSITION:jury duty # labs today- cbc/cmp/magnesium # follow up in 4 weeks-MD; labs- cbc/cmp/magnesium Dr.B    All questions were answered. The patient knows to call the clinic with any problems, questions or concerns.     Cammie Sickle, MD 03/12/2021 11:29 AM

## 2021-03-12 NOTE — Assessment & Plan Note (Addendum)
#  Chronic myeloid leukemia- on dasatinib 100 mg/day [1st week of April, 2022].  Tolerating well except for abdominal bloating/flatulence [see below].  Repeat EKG today shows no evidence of any QT prolongation.  Continue current dose of Dasatinib.  Discussed with Ebony Hail.  #Abdominal bloating/flatulence-patient currently off PPI/H2 blocker because of interaction with Dasatinib.  Recommend simethicone.  #Substance drug abuse-March 2022-positive for cocaine [[facility]]-counseled the patient to quit illicit drugs.  Reminded the patient will avoid any illicit drugs.  #Diabetes-poorly controlled; currently on insulin.  Stable monitor closely.  #Jury duty: Given patient multiple complaints-it would be reasonable to excuse patient from jury duty.  Letter given.  # DISPOSITION:jury duty # labs today- cbc/cmp/magnesium # follow up in 4 weeks-MD; labs- cbc/cmp/magnesium Dr.B

## 2021-03-12 NOTE — Progress Notes (Signed)
[place]  [phone] [phone]  Patient Care Team: Administration, [place] as PCP - General   Name of the patient: Frank [name]  [id number]  [birth year]   Date of visit: 03/12/21  HPI: Patient is a 60 y.o. male with newly diagnosed chronic phase CML. Currently treated with Sprycel (dasatinib). Started his dasatinib on 02/24/21  Reason for Consult: Oral chemotherapy follow-up for dasatinib therapy.   PAST MEDICAL HISTORY: Past Medical History:  Diagnosis Date  . Arthritis   . Chronic low back pain with sciatica   . CKD (chronic kidney disease)    unknown stage  . Diabetes mellitus without complication (HCC)    insulin pump therapy  . Hemorrhoids   . History of pancreatitis   . Hyperlipidemia   . Hypertension   . 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 he bone marrow is hypercellular for age with myeloid and megakaryocytic  hyperplasia.  There is no increase in blasts by manual differential count of aspirate smears (<1%) or by CD34 immunohistochemistry on the core biopsy.  A reticulin special stain reveals no significant increase  in reticulin fibrosis (MF grade 0 of 3).  # April 1st week -DASATINIB 100 mg    #hypertension /alcoholic liver/ type 2 diabetes on insulin poorly controlled chronic back pain    Chronic myeloid leukemia ([place])  01/31/2021 Initial Diagnosis   Chronic myeloid leukemia (HCC)     ALLERGIES:  has No Known Allergies.  MEDICATIONS:  Current Outpatient Medications  Medication Sig Dispense Refill  . amLODipine (NORVASC) 10 MG tablet Take 0.5 tablets by mouth daily.    Marland Kitchen aspirin 81 MG EC tablet Take 1  tablet by mouth daily. With a meal    . Brinzolamide-Brimonidine 1-0.2 % SUSP Place 1 drop into both eyes in the morning, at noon, and at bedtime.    . capsaicin (ZOSTRIX) 0.025 % cream Apply 1 application topically daily as needed. APPLY SUFFICIENT AMOUNT TOPICALLY ACTIVE EVERY DAY AVOID GETTING ON FACE, IN EYES OR ON SENSITIVE AREAS. Manuel Garcia HANDS THOROUGHLY AFTER USING.    Marland Kitchen Cholecalciferol 25 MCG (1000 UT) tablet Take 2 tablets by mouth daily.    . dasatinib (SPRYCEL) 100 MG tablet Take 1 tablet (100 mg total) by mouth daily. 30 tablet 3  . dasatinib (SPRYCEL) 100 MG tablet TAKE 1 TABLET (100 MG TOTAL) BY MOUTH DAILY. 30 tablet 3  . insulin aspart (NOVOLOG) 100 UNIT/ML injection Inject 100 Units into the skin continuous. INJECT 100 UNITS UNDER SKIN CONTINUOUS-VIA-PUMP AS DIRECTED FOR DIABETES.DISCARD BOTTLE 28 DAYS AFTER OPENING    . insulin glargine (LANTUS) 100 UNIT/ML injection INJECT 12 UNITS UNDER SKIN EVERY DAY FOR DIABETES MELLITUS DO NOT MIX WITH OTHER INSULINS IN THE SAME SYRINGE. DISCARD BOTTLE 28 DAYS AFTER OPENING  TO REPLACE BASAL INSULIN RATE IF PUMP MALFUNCTIONS FOR DIABETES MELLITUS DO NOT MIX WITH OTHER INSULINS IN THE SAME SYRINGE. DISCARD BOTTLE 28 DAYS AFTER OPENING  TO REPLACE BASAL INSULIN RATE IF PUMP MALFUNCTIONS    . latanoprost (XALATAN) 0.005 % ophthalmic solution Place 1 drop into both eyes at bedtime.    . lipase/protease/amylase (CREON) 36000 UNITS CPEP capsule See admin instructions. TAKE 2 CAPSULES  BY MOUTH WITH ACTIVE MEALS AND TAKE 1 CAPSULE WITH SNACK    . lisinopril-hydrochlorothiazide (ZESTORETIC) 20-12.5 MG tablet Take 1 tablet by mouth daily.    . methocarbamol (ROBAXIN) 500 MG tablet Take 1 tablet (500 mg total) by mouth every 8 (eight) hours as needed for muscle spasms. 90 tablet 1  . nicotine (NICODERM CQ - DOSED IN MG/24 HOURS) 14 mg/24hr patch Place 14 mg onto the skin daily. APPLY ONE PATCH TO SKIN EVERY DAY ACTIVE APPLY ONE PATCH WHEN YOU WAKE UP AND  REMOVE THE OLD PATCH. PEEL THE BACK OFF THE PATCH AND PUT IT ON CLEAN,DRY, HAIR-FREE SKIN ON THE UPPER ARM, CHEST OR BACK    . nicotine polacrilex (COMMIT) 4 MG lozenge Take 4 mg by mouth as needed. DISSOLVE 1 LOZENGE BY MOUTH AS DIRECTED - USE AT LEAST 8-10 LOZENGES EACH DAY TO START. DO NOT USE MORE THAN 20 LOZENGES    . pregabalin (LYRICA) 50 MG capsule Take 1 capsule (50 mg total) by mouth 3 (three) times daily. 90 capsule 1  . rosuvastatin (CRESTOR) 20 MG tablet Take 10 mg by mouth daily.    . timolol (TIMOPTIC) 0.5 % ophthalmic solution Place 1 drop into both eyes 2 (two) times daily.     No current facility-administered medications for this visit.    VITAL SIGNS: There were no vitals taken for this visit. There were no vitals filed for this visit.  Estimated body mass index is 28.46 kg/m as calculated from the following:   Height as of 02/20/21: 5\' 8"  (1.727 m).   Weight as of an earlier encounter on 03/12/21: 84.9 kg (187 lb 3 oz).  LABS: CBC:    Component Value Date/Time   WBC 24.7 (H) 02/05/2021 0927   HGB 12.3 (L) 02/05/2021 0927   HGB 12.7 (L) 09/14/2014 0509   HCT 37.6 (L) 02/05/2021 0927   HCT 39.4 (L) 09/14/2014 0509   PLT 355 02/05/2021 0927   PLT 200 09/14/2014 0509   MCV 90.6 02/05/2021 0927   MCV 91 09/14/2014 0509   NEUTROABS 13.1 (H) 02/05/2021 0927   NEUTROABS 5.5 09/14/2014 0509   LYMPHSABS 2.7 02/05/2021 0927   LYMPHSABS 2.8 09/14/2014 0509   MONOABS 1.9 (H) 02/05/2021 0927   MONOABS 0.9 09/14/2014 0509   EOSABS 0.7 (H) 02/05/2021 0927   EOSABS 0.2 09/14/2014 0509   BASOSABS 0.7 (H) 02/05/2021 0927   BASOSABS 0.1 09/14/2014 0509   Comprehensive Metabolic Panel:    Component Value Date/Time   NA 137 01/31/2021 1436   NA 142 09/15/2014 0500   K 4.3 01/31/2021 1436   K 4.0 09/15/2014 0500   CL 105 01/31/2021 1436   CL 112 (H) 09/15/2014 0500   CO2 23 01/31/2021 1436   CO2 22 09/15/2014 0500   BUN 22 (H) 01/31/2021 1436   BUN 13 09/15/2014  0500   CREATININE 1.22 01/31/2021 1436   CREATININE 1.09 09/15/2014 0500   GLUCOSE 153 (H) 01/31/2021 1436   GLUCOSE 110 (H) 09/15/2014 0500   CALCIUM 9.3 01/31/2021 1436   CALCIUM 8.4 (L) 09/15/2014 0500   AST 18 01/31/2021 1436   AST 18 09/13/2014 1341   ALT 16 01/31/2021 1436   ALT 25 09/13/2014 1341   ALKPHOS 66 01/31/2021 1436   ALKPHOS 86 09/13/2014 1341   BILITOT 0.5 01/31/2021 1436   BILITOT 0.2 09/13/2014 1341   PROT 7.6 01/31/2021 1436   PROT 7.5 09/13/2014 1341   ALBUMIN 4.3 01/31/2021 1436   ALBUMIN 3.7  09/13/2014 1341     Assessment and Plan-  Continue dasatinib 100mg  daily   Oral Chemotherapy Side Effect/Intolerance:  Headache: slight headach for the first couple days he was on dasatinib but he later attributed this to his sinuses. He takes diphehydramine for his allergies but plans on switching to a non-drowsy antihistamine and starting Flonase nasal spray.  Shortness of breath: on excretion, he goes walking 4-5 days for about 20 mins at a track near his house. He has shortness of breath by the end. His exercise tolerance should improve as he continues to walk. Told him it is great he is working out but to make sure not to push it too much Fatigue: he reports feeling tired around 4 each day then going to sleep around 7 pm. He sleeps until 2am then is p for the day. He is getting a decent about off sleep just with abnormal sleeping hours Reflux and gas: He has to stop his omeprazole due to the DDI with dasatinib. He has been taking calcium carbonate as needed but has noticed an increase in gas. Suggested he start taking the calcium carbonate and simethicone combination product to help with both the reflux and gas. He will give that a try. Provided him with the names of some available combination products.  Oral Chemotherapy Adherence: no missed doses, he takes his medication daily at 645pm No patient barriers to medication adherence identified.   New medications: none  reported  Medication Access Issues: no issues, fills at [place]  Patient expressed understanding and was in agreement with this plan. He also understands that He can call clinic at any time with any questions, concerns, or complaints.   Thank you for allowing me to participate in the care of this very pleasant patient.   Time Total: 15 mins  Visit consisted of counseling and education on dealing with issues of symptom management in the setting of serious and potentially life-threatening illness.Greater than 50%  of this time was spent counseling and coordinating care related to the above assessment and plan.  Signed by: Darl Pikes, PharmD, BCPS, Salley Slaughter, CPP Hematology/Oncology Clinical Pharmacist Practitioner [facility]/HP/AP [facility] [phone]  03/12/2021 4:32 PM

## 2021-03-14 ENCOUNTER — Encounter: Payer: Self-pay | Admitting: Internal Medicine

## 2021-03-16 ENCOUNTER — Other Ambulatory Visit (HOSPITAL_COMMUNITY): Payer: Self-pay

## 2021-03-16 MED FILL — Dasatinib Tab 100 MG: ORAL | 30 days supply | Qty: 30 | Fill #0 | Status: AC

## 2021-03-19 ENCOUNTER — Other Ambulatory Visit (HOSPITAL_COMMUNITY): Payer: Self-pay

## 2021-03-21 ENCOUNTER — Other Ambulatory Visit (HOSPITAL_COMMUNITY): Payer: Self-pay

## 2021-04-06 ENCOUNTER — Other Ambulatory Visit: Payer: Self-pay

## 2021-04-06 DIAGNOSIS — C9210 Chronic myeloid leukemia, BCR/ABL-positive, not having achieved remission: Secondary | ICD-10-CM

## 2021-04-09 ENCOUNTER — Inpatient Hospital Stay (HOSPITAL_BASED_OUTPATIENT_CLINIC_OR_DEPARTMENT_OTHER): Payer: Medicare Other | Admitting: Oncology

## 2021-04-09 ENCOUNTER — Inpatient Hospital Stay: Payer: Medicare Other | Attending: Oncology

## 2021-04-09 ENCOUNTER — Other Ambulatory Visit: Payer: Self-pay

## 2021-04-09 ENCOUNTER — Encounter: Payer: Self-pay | Admitting: Oncology

## 2021-04-09 VITALS — BP 131/71 | HR 77 | Temp 98.2°F | Resp 18 | Wt 187.4 lb

## 2021-04-09 DIAGNOSIS — R143 Flatulence: Secondary | ICD-10-CM

## 2021-04-09 DIAGNOSIS — Z5111 Encounter for antineoplastic chemotherapy: Secondary | ICD-10-CM

## 2021-04-09 DIAGNOSIS — D649 Anemia, unspecified: Secondary | ICD-10-CM | POA: Diagnosis not present

## 2021-04-09 DIAGNOSIS — C9210 Chronic myeloid leukemia, BCR/ABL-positive, not having achieved remission: Secondary | ICD-10-CM

## 2021-04-09 DIAGNOSIS — D729 Disorder of white blood cells, unspecified: Secondary | ICD-10-CM

## 2021-04-09 DIAGNOSIS — N179 Acute kidney failure, unspecified: Secondary | ICD-10-CM

## 2021-04-09 LAB — COMPREHENSIVE METABOLIC PANEL
ALT: 37 U/L (ref 0–44)
AST: 39 U/L (ref 15–41)
Albumin: 3.9 g/dL (ref 3.5–5.0)
Alkaline Phosphatase: 86 U/L (ref 38–126)
Anion gap: 8 (ref 5–15)
BUN: 20 mg/dL (ref 6–20)
CO2: 21 mmol/L — ABNORMAL LOW (ref 22–32)
Calcium: 9.2 mg/dL (ref 8.9–10.3)
Chloride: 104 mmol/L (ref 98–111)
Creatinine, Ser: 1.39 mg/dL — ABNORMAL HIGH (ref 0.61–1.24)
GFR, Estimated: 58 mL/min — ABNORMAL LOW (ref >60)
Glucose, Bld: 159 mg/dL — ABNORMAL HIGH (ref 70–99)
Potassium: 4.2 mmol/L (ref 3.5–5.1)
Sodium: 133 mmol/L — ABNORMAL LOW (ref 135–145)
Total Bilirubin: 0.5 mg/dL (ref 0.3–1.2)
Total Protein: 7.2 g/dL (ref 6.5–8.1)

## 2021-04-09 LAB — CBC WITH DIFFERENTIAL/PLATELET
Abs Immature Granulocytes: 0.01 10*3/uL (ref 0.00–0.07)
Basophils Absolute: 0 10*3/uL (ref 0.0–0.1)
Basophils Relative: 0 %
Eosinophils Absolute: 0 10*3/uL (ref 0.0–0.5)
Eosinophils Relative: 1 %
HCT: 27.5 % — ABNORMAL LOW (ref 39.0–52.0)
Hemoglobin: 9 g/dL — ABNORMAL LOW (ref 13.0–17.0)
Immature Granulocytes: 0 %
Lymphocytes Relative: 22 %
Lymphs Abs: 1 10*3/uL (ref 0.7–4.0)
MCH: 30.5 pg (ref 26.0–34.0)
MCHC: 32.7 g/dL (ref 30.0–36.0)
MCV: 93.2 fL (ref 80.0–100.0)
Monocytes Absolute: 0.5 10*3/uL (ref 0.1–1.0)
Monocytes Relative: 11 %
Neutro Abs: 3 10*3/uL (ref 1.7–7.7)
Neutrophils Relative %: 66 %
Platelets: 201 10*3/uL (ref 150–400)
RBC: 2.95 MIL/uL — ABNORMAL LOW (ref 4.22–5.81)
RDW: 16.1 % — ABNORMAL HIGH (ref 11.5–15.5)
WBC: 4.6 10*3/uL (ref 4.0–10.5)
nRBC: 0 % (ref 0.0–0.2)

## 2021-04-09 LAB — LACTATE DEHYDROGENASE: LDH: 183 U/L (ref 98–192)

## 2021-04-09 LAB — MAGNESIUM: Magnesium: 2 mg/dL (ref 1.7–2.4)

## 2021-04-09 NOTE — Progress Notes (Signed)
[facility] NOTE  Patient Care Team: Administration, Veterans as PCP - General  CHIEF COMPLAINTS/PURPOSE OF CONSULTATION: CML  #  Oncology History Overview Note  Result Comment:   Comment: (NOTE)  ABNORMAL: 89% OF NUCLEI POSITIVE FOR BCR/ABL1 GENE FUSION  SIGNAL(S)     # MARCH 2022--  Hypercellular bone marrow with involvement by chronic myeloid  leukemia, BCR-ABL1-positive . he bone marrow is hypercellular for age with myeloid and megakaryocytic  hyperplasia.  There is no increase in blasts by manual differential count of aspirate smears (<1%) or by CD34 immunohistochemistry on the core biopsy.  A reticulin special stain reveals no significant increase  in reticulin fibrosis (MF grade 0 of 3).  # April 1st week -DASATINIB 100 mg    #hypertension /alcoholic liver/ type 2 diabetes on insulin poorly controlled chronic back pain    Chronic myeloid leukemia ([place])  01/31/2021 Initial Diagnosis   Chronic myeloid leukemia (HCC)      HISTORY OF PRESENTING ILLNESS:  Frank [name] 60 y.o.  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  Patient follows up with Dr.Brahmanday who is off today, I am covering him to see this patient. Patient has been on Dasatinib 100 mg daily since early April 2022. off PPI because of interaction with Dasatinib.  Overall he tolerates treatment.  Chronic arthralgia unchanged. Flatulence symptoms are stable.   Review of Systems  Constitutional: Positive for malaise/fatigue. Negative for chills, diaphoresis, fever and weight loss.  HENT: Negative for nosebleeds and sore throat.   Eyes: Negative for double vision.  Respiratory: Negative for cough, hemoptysis, sputum production, shortness of breath and wheezing.   Cardiovascular: Negative for chest pain, palpitations, orthopnea and leg swelling.  Gastrointestinal: Negative for abdominal pain, blood in stool, constipation, diarrhea, heartburn, melena, nausea and vomiting.   Genitourinary: Negative for dysuria, frequency and urgency.  Musculoskeletal: Positive for back pain and joint pain.  Skin: Negative.  Negative for itching and rash.  Neurological: Negative for dizziness, tingling, focal weakness, weakness and headaches.  Endo/Heme/Allergies: Does not bruise/bleed easily.  Psychiatric/Behavioral: Negative for depression. The patient is not nervous/anxious and does not have insomnia.      MEDICAL HISTORY:  Past Medical History:  Diagnosis Date  . Arthritis   . Chronic low back pain with sciatica   . CKD (chronic kidney disease)    unknown stage  . Diabetes mellitus without complication (HCC)    insulin pump therapy  . Hemorrhoids   . History of pancreatitis   . Hyperlipidemia   . Hypertension   . 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 No pertinent surgical history.  SOCIAL HISTORY: Social History   Socioeconomic History  . Marital status: Single    Spouse name: Not on file  . Number of children: Not on file  . Years of education: Not on file  . Highest education level: Not on file  Occupational History  . Occupation: retired     Comment: custodian   Tobacco Use  . Smoking status: Current Some Day Smoker    Types: Cigarettes  . Smokeless tobacco: Never Used  . Tobacco comment: 4 cigarettes yesterday   Substance and Sexual Activity  . Alcohol use: Yes    Comment: hx of ETOH abuse  . Drug use: Yes    Types: Cocaine, Marijuana    Comment: had cocaine Sat. (2 days ago)  . Sexual activity: Not on file  Other Topics Concern  .  Not on file  Social History Narrative   Lives with S.O. [name]    Social Determinants of Health   Financial Resource Strain: Not on file  Food Insecurity: Not on file  Transportation Needs: Not on file  Physical Activity: Not on file  Stress: Not on file  Social Connections: Not on file  Intimate Partner Violence: Not on file    FAMILY HISTORY: Family  History  Problem Relation Age of Onset  . Diabetes Mother   . Hypertension Mother   . Heart failure Mother   . Hyperlipidemia Mother   . Thyroid disease Mother   . Alcohol abuse Father   . Emphysema Father   . Kidney failure Father   . Gout Father   . Cancer Sister   . Hyperlipidemia Sister   . Hypertension Sister   . Rheum arthritis Sister   . Fibromyalgia Sister   . Thyroid disease Sister     ALLERGIES:  has No Known Allergies.  MEDICATIONS:  Current Outpatient Medications  Medication Sig Dispense Refill  . amLODipine (NORVASC) 10 MG tablet Take 0.5 tablets by mouth daily.    Marland Kitchen aspirin 81 MG EC tablet Take 1 tablet by mouth daily. With a meal    . Brinzolamide-Brimonidine 1-0.2 % SUSP Place 1 drop into both eyes in the morning, at noon, and at bedtime.    . capsaicin (ZOSTRIX) 0.025 % cream Apply 1 application topically daily as needed. APPLY SUFFICIENT AMOUNT TOPICALLY ACTIVE EVERY DAY AVOID GETTING ON FACE, IN EYES OR ON SENSITIVE AREAS. [place] HANDS THOROUGHLY AFTER USING.    Marland Kitchen Cholecalciferol 25 MCG (1000 UT) tablet Take 2 tablets by mouth daily.    . dasatinib (SPRYCEL) 100 MG tablet TAKE 1 TABLET (100 MG TOTAL) BY MOUTH DAILY. 30 tablet 3  . insulin aspart (NOVOLOG) 100 UNIT/ML injection Inject 100 Units into the skin continuous. INJECT 100 UNITS UNDER SKIN CONTINUOUS-VIA-PUMP AS DIRECTED FOR DIABETES.DISCARD BOTTLE 28 DAYS AFTER OPENING    . insulin glargine (LANTUS) 100 UNIT/ML injection INJECT 12 UNITS UNDER SKIN EVERY DAY FOR DIABETES MELLITUS DO NOT MIX WITH OTHER INSULINS IN THE SAME SYRINGE. DISCARD BOTTLE 28 DAYS AFTER OPENING  TO REPLACE BASAL INSULIN RATE IF PUMP MALFUNCTIONS FOR DIABETES MELLITUS DO NOT MIX WITH OTHER INSULINS IN THE SAME SYRINGE. DISCARD BOTTLE 28 DAYS AFTER OPENING  TO REPLACE BASAL INSULIN RATE IF PUMP MALFUNCTIONS    . latanoprost (XALATAN) 0.005 % ophthalmic solution Place 1 drop into both eyes at bedtime.    . lipase/protease/amylase (CREON)  36000 UNITS CPEP capsule See admin instructions. TAKE 2 CAPSULES BY MOUTH WITH ACTIVE MEALS AND TAKE 1 CAPSULE WITH SNACK    . lisinopril-hydrochlorothiazide (ZESTORETIC) 20-12.5 MG tablet Take 1 tablet by mouth daily.    . methocarbamol (ROBAXIN) 500 MG tablet Take 1 tablet (500 mg total) by mouth every 8 (eight) hours as needed for muscle spasms. 90 tablet 1  . nicotine (NICODERM CQ - DOSED IN MG/24 HOURS) 14 mg/24hr patch Place 14 mg onto the skin daily. APPLY ONE PATCH TO SKIN EVERY DAY ACTIVE APPLY ONE PATCH WHEN YOU WAKE UP AND REMOVE THE OLD PATCH. PEEL THE BACK OFF THE PATCH AND PUT IT ON CLEAN,DRY, HAIR-FREE SKIN ON THE UPPER ARM, CHEST OR BACK    . nicotine polacrilex (COMMIT) 4 MG lozenge Take 4 mg by mouth as needed. DISSOLVE 1 LOZENGE BY MOUTH AS DIRECTED - USE AT LEAST 8-10 LOZENGES EACH DAY TO START. DO NOT USE MORE THAN  20 LOZENGES    . pregabalin (LYRICA) 50 MG capsule Take 1 capsule (50 mg total) by mouth 3 (three) times daily. 90 capsule 1  . rosuvastatin (CRESTOR) 20 MG tablet Take 10 mg by mouth daily.    . timolol (TIMOPTIC) 0.5 % ophthalmic solution Place 1 drop into both eyes 2 (two) times daily.     No current facility-administered medications for this visit.      Marland Kitchen  PHYSICAL EXAMINATION: ECOG PERFORMANCE STATUS: 0 - Asymptomatic  Vitals:   04/09/21 1451  BP: 131/71  Pulse: 77  Resp: 18  Temp: 98.2 F (36.8 C)   Filed Weights   04/09/21 1451  Weight: 187 lb 6.4 oz (85 kg)    Physical Exam Constitutional:      Comments: Patient ambulating independently.    HENT:     Head: Normocephalic and atraumatic.     Mouth/Throat:     Pharynx: No oropharyngeal exudate.  Eyes:     Pupils: Pupils are equal, round, and reactive to light.  Cardiovascular:     Rate and Rhythm: Normal rate and regular rhythm.  Pulmonary:     Effort: No respiratory distress.     Breath sounds: No wheezing.     Comments: Decreased air entry bilaterally. Abdominal:     General:  Bowel sounds are normal. There is no distension.     Palpations: Abdomen is soft. There is no mass.     Tenderness: There is no abdominal tenderness. There is no guarding or rebound.  Musculoskeletal:        General: No tenderness. Normal range of motion.     Cervical back: Normal range of motion and neck supple.  Skin:    General: Skin is warm.  Neurological:     Mental Status: He is alert and oriented to person, place, and time.  Psychiatric:        Mood and Affect: Affect normal.      LABORATORY DATA:  I have reviewed the data as listed Lab Results  Component Value Date   WBC 4.6 04/09/2021   HGB 9.0 (L) 04/09/2021   HCT 27.5 (L) 04/09/2021   MCV 93.2 04/09/2021   PLT 201 04/09/2021   Recent Labs    01/23/21 0445 01/31/21 1436 04/09/21 1427  NA 138 137 133*  K 3.6 4.3 4.2  CL 110 105 104  CO2 22 23 21*  GLUCOSE 93 153* 159*  BUN 14 22* 20  CREATININE 0.96 1.22 1.39*  CALCIUM 8.5* 9.3 9.2  GFRNONAA >60 >60 58*  PROT 6.0* 7.6 7.2  ALBUMIN 3.5 4.3 3.9  AST 16 18 39  ALT 15 16 37  ALKPHOS 55 66 86  BILITOT 0.4 0.5 0.5    RADIOGRAPHIC STUDIES: I have personally reviewed the radiological images as listed and agreed with the findings in the report. No results found.  ASSESSMENT & PLAN:   1. Chronic myeloid leukemia ([place])   2. Encounter for antineoplastic chemotherapy   3. Normocytic anemia   4. Flatulence    #CML, on Dasatinib 100 mg daily. Overall he tolerates well.  EKG done after 2 weeks of use showed no evidence of QTC presentation. Labs reviewed and discussed with patient. Continue Dasatinib with same dose.  #Abdominal bloating/flatulence, currently off PPI/H2 blocker due to potential interaction with Dasatinib.  Continue simethicone.   #Anemia, likely secondary to Dasatinib.  Close monitor CBC.  Repeat CBC in 2 weeks. Check B12, Folate, iron panel  Follow-up with Dr. Rogue Bussing  in 4 weeks.  CBC, CMP, LDH, Mag All questions were answered. The  patient knows to call the clinic with any problems, questions or concerns.     Earlie Server, MD 04/09/2021 11:38 PM

## 2021-04-09 NOTE — Progress Notes (Signed)
Patient denies new problems/concerns today.   °

## 2021-04-10 DIAGNOSIS — D649 Anemia, unspecified: Secondary | ICD-10-CM | POA: Insufficient documentation

## 2021-04-10 DIAGNOSIS — Z5111 Encounter for antineoplastic chemotherapy: Secondary | ICD-10-CM | POA: Insufficient documentation

## 2021-04-11 ENCOUNTER — Encounter: Payer: Self-pay | Admitting: Student in an Organized Health Care Education/Training Program

## 2021-04-12 ENCOUNTER — Other Ambulatory Visit: Payer: Self-pay

## 2021-04-12 ENCOUNTER — Ambulatory Visit
Payer: Medicare Other | Attending: Student in an Organized Health Care Education/Training Program | Admitting: Student in an Organized Health Care Education/Training Program

## 2021-04-12 DIAGNOSIS — G894 Chronic pain syndrome: Secondary | ICD-10-CM

## 2021-04-12 NOTE — Progress Notes (Signed)
I attempted to call the patient however no response. No voicemail option -Dr Holley Raring

## 2021-04-13 ENCOUNTER — Other Ambulatory Visit (HOSPITAL_COMMUNITY): Payer: Self-pay

## 2021-04-16 ENCOUNTER — Other Ambulatory Visit (HOSPITAL_COMMUNITY): Payer: Self-pay

## 2021-04-16 MED FILL — Dasatinib Tab 100 MG: ORAL | 30 days supply | Qty: 30 | Fill #1 | Status: AC

## 2021-04-19 ENCOUNTER — Other Ambulatory Visit: Payer: Self-pay

## 2021-04-19 ENCOUNTER — Ambulatory Visit
Payer: Medicare Other | Attending: Student in an Organized Health Care Education/Training Program | Admitting: Student in an Organized Health Care Education/Training Program

## 2021-04-19 DIAGNOSIS — G894 Chronic pain syndrome: Secondary | ICD-10-CM

## 2021-04-19 DIAGNOSIS — F1410 Cocaine abuse, uncomplicated: Secondary | ICD-10-CM

## 2021-04-19 NOTE — Progress Notes (Signed)
I attempted to call the patient however no response.  -Dr Lawrie Tunks  

## 2021-04-20 ENCOUNTER — Telehealth: Payer: Self-pay | Admitting: *Deleted

## 2021-04-24 ENCOUNTER — Other Ambulatory Visit: Payer: Self-pay

## 2021-04-24 ENCOUNTER — Inpatient Hospital Stay: Payer: Medicare Other

## 2021-04-24 DIAGNOSIS — C9210 Chronic myeloid leukemia, BCR/ABL-positive, not having achieved remission: Secondary | ICD-10-CM | POA: Diagnosis not present

## 2021-04-24 DIAGNOSIS — Z5111 Encounter for antineoplastic chemotherapy: Secondary | ICD-10-CM

## 2021-04-24 LAB — CBC WITH DIFFERENTIAL/PLATELET
Abs Immature Granulocytes: 0.02 10*3/uL (ref 0.00–0.07)
Basophils Absolute: 0 10*3/uL (ref 0.0–0.1)
Basophils Relative: 0 %
Eosinophils Absolute: 0.1 10*3/uL (ref 0.0–0.5)
Eosinophils Relative: 1 %
HCT: 35.6 % — ABNORMAL LOW (ref 39.0–52.0)
Hemoglobin: 11.5 g/dL — ABNORMAL LOW (ref 13.0–17.0)
Immature Granulocytes: 0 %
Lymphocytes Relative: 19 %
Lymphs Abs: 1.2 10*3/uL (ref 0.7–4.0)
MCH: 29.9 pg (ref 26.0–34.0)
MCHC: 32.3 g/dL (ref 30.0–36.0)
MCV: 92.5 fL (ref 80.0–100.0)
Monocytes Absolute: 0.5 10*3/uL (ref 0.1–1.0)
Monocytes Relative: 9 %
Neutro Abs: 4.5 10*3/uL (ref 1.7–7.7)
Neutrophils Relative %: 71 %
Platelets: 236 10*3/uL (ref 150–400)
RBC: 3.85 MIL/uL — ABNORMAL LOW (ref 4.22–5.81)
RDW: 16.5 % — ABNORMAL HIGH (ref 11.5–15.5)
WBC: 6.4 10*3/uL (ref 4.0–10.5)
nRBC: 0 % (ref 0.0–0.2)

## 2021-04-24 LAB — FOLATE: Folate: 31 ng/mL (ref >5.9)

## 2021-04-24 LAB — IRON AND TIBC
Iron: 38 ug/dL — ABNORMAL LOW (ref 45–182)
Saturation Ratios: 10 % — ABNORMAL LOW (ref 17.9–39.5)
TIBC: 389 ug/dL (ref 250–450)
UIBC: 351 ug/dL

## 2021-04-24 LAB — VITAMIN B12: Vitamin B-12: 664 pg/mL (ref 180–914)

## 2021-04-24 LAB — FERRITIN: Ferritin: 27 ng/mL (ref 24–336)

## 2021-04-26 NOTE — Telephone Encounter (Signed)
Called patient and let him know that Dr Holley Raring does not want [place] to reschedule him that he needs to f/up with PCP.  Patient verbalizes u/o information.

## 2021-05-07 ENCOUNTER — Inpatient Hospital Stay: Payer: Medicare Other | Attending: Internal Medicine

## 2021-05-07 ENCOUNTER — Inpatient Hospital Stay (HOSPITAL_BASED_OUTPATIENT_CLINIC_OR_DEPARTMENT_OTHER): Payer: Medicare Other | Admitting: Internal Medicine

## 2021-05-07 ENCOUNTER — Encounter: Payer: Self-pay | Admitting: Internal Medicine

## 2021-05-07 ENCOUNTER — Other Ambulatory Visit: Payer: Self-pay

## 2021-05-07 DIAGNOSIS — Z79899 Other long term (current) drug therapy: Secondary | ICD-10-CM | POA: Diagnosis not present

## 2021-05-07 DIAGNOSIS — N189 Chronic kidney disease, unspecified: Secondary | ICD-10-CM | POA: Insufficient documentation

## 2021-05-07 DIAGNOSIS — C9210 Chronic myeloid leukemia, BCR/ABL-positive, not having achieved remission: Secondary | ICD-10-CM | POA: Insufficient documentation

## 2021-05-07 DIAGNOSIS — Z794 Long term (current) use of insulin: Secondary | ICD-10-CM | POA: Insufficient documentation

## 2021-05-07 DIAGNOSIS — Z7982 Long term (current) use of aspirin: Secondary | ICD-10-CM | POA: Insufficient documentation

## 2021-05-07 DIAGNOSIS — E1122 Type 2 diabetes mellitus with diabetic chronic kidney disease: Secondary | ICD-10-CM | POA: Insufficient documentation

## 2021-05-07 DIAGNOSIS — E1165 Type 2 diabetes mellitus with hyperglycemia: Secondary | ICD-10-CM | POA: Diagnosis not present

## 2021-05-07 DIAGNOSIS — D649 Anemia, unspecified: Secondary | ICD-10-CM | POA: Insufficient documentation

## 2021-05-07 LAB — CBC WITH DIFFERENTIAL/PLATELET
Abs Immature Granulocytes: 0.01 10*3/uL (ref 0.00–0.07)
Basophils Absolute: 0 10*3/uL (ref 0.0–0.1)
Basophils Relative: 0 %
Eosinophils Absolute: 0.3 10*3/uL (ref 0.0–0.5)
Eosinophils Relative: 6 %
HCT: 33.9 % — ABNORMAL LOW (ref 39.0–52.0)
Hemoglobin: 11.4 g/dL — ABNORMAL LOW (ref 13.0–17.0)
Immature Granulocytes: 0 %
Lymphocytes Relative: 22 %
Lymphs Abs: 1.1 10*3/uL (ref 0.7–4.0)
MCH: 30.3 pg (ref 26.0–34.0)
MCHC: 33.6 g/dL (ref 30.0–36.0)
MCV: 90.2 fL (ref 80.0–100.0)
Monocytes Absolute: 0.4 10*3/uL (ref 0.1–1.0)
Monocytes Relative: 8 %
Neutro Abs: 3.1 10*3/uL (ref 1.7–7.7)
Neutrophils Relative %: 64 %
Platelets: 174 10*3/uL (ref 150–400)
RBC: 3.76 MIL/uL — ABNORMAL LOW (ref 4.22–5.81)
RDW: 15 % (ref 11.5–15.5)
WBC: 4.9 10*3/uL (ref 4.0–10.5)
nRBC: 0 % (ref 0.0–0.2)

## 2021-05-07 LAB — COMPREHENSIVE METABOLIC PANEL
ALT: 23 U/L (ref 0–44)
AST: 27 U/L (ref 15–41)
Albumin: 4.2 g/dL (ref 3.5–5.0)
Alkaline Phosphatase: 93 U/L (ref 38–126)
Anion gap: 8 (ref 5–15)
BUN: 24 mg/dL — ABNORMAL HIGH (ref 6–20)
CO2: 20 mmol/L — ABNORMAL LOW (ref 22–32)
Calcium: 9.2 mg/dL (ref 8.9–10.3)
Chloride: 106 mmol/L (ref 98–111)
Creatinine, Ser: 1.4 mg/dL — ABNORMAL HIGH (ref 0.61–1.24)
GFR, Estimated: 58 mL/min — ABNORMAL LOW (ref >60)
Glucose, Bld: 229 mg/dL — ABNORMAL HIGH (ref 70–99)
Potassium: 4.2 mmol/L (ref 3.5–5.1)
Sodium: 134 mmol/L — ABNORMAL LOW (ref 135–145)
Total Bilirubin: 0.3 mg/dL (ref 0.3–1.2)
Total Protein: 7.6 g/dL (ref 6.5–8.1)

## 2021-05-07 LAB — MAGNESIUM: Magnesium: 2 mg/dL (ref 1.7–2.4)

## 2021-05-07 LAB — LACTATE DEHYDROGENASE: LDH: 160 U/L (ref 98–192)

## 2021-05-07 NOTE — Assessment & Plan Note (Addendum)
#  Chronic myeloid leukemia- on dasatinib 100 mg/day [1st week of April, 2022].  Tolerating well.  #Today white count 4.3 hemoglobin 11 ; platelets normal.  Continue current dose of Dasatinib.  We will check BCR ABL RT-PCR; next visit.  #Mild anemia/CKD--hemoglobin 11 saturation 10%; would recommend p.o. iron.  #Substance drug abuse-March 2022-positive for cocaine [[facility]]-states to quit illicit drugs.  Stable  #Diabetes-poorly controlled; currently on insulin.  Stable monitor closely.229    # DISPOSITION: # follow up in 4 weeks-MD; labs- cbc/cmp/magnesium; add bcr-abl FISH/PCR- Dr.B

## 2021-05-07 NOTE — Progress Notes (Signed)
[facility] NOTE  Patient Care Team: Administration, Veterans as PCP - General  CHIEF COMPLAINTS/PURPOSE OF CONSULTATION: CML  #  Oncology History Overview Note  Result Comment:   Comment: (NOTE)  ABNORMAL: 89% OF NUCLEI POSITIVE FOR BCR/ABL1 GENE FUSION  SIGNAL(S)     # MARCH 2022--  Hypercellular bone marrow with involvement by chronic myeloid  leukemia, BCR-ABL1-positive . he bone marrow is hypercellular for age with myeloid and megakaryocytic  hyperplasia.  There is no increase in blasts by manual differential count of aspirate smears (<1%) or by CD34 immunohistochemistry on the core biopsy.  A reticulin special stain reveals no significant increase  in reticulin fibrosis (MF grade 0 of 3).  # April 1st week -DASATINIB 100 mg    #hypertension /alcoholic liver/ type 2 diabetes on insulin poorly controlled chronic back pain    Chronic myeloid leukemia ([place])  01/31/2021 Initial Diagnosis   Chronic myeloid leukemia (HCC)      HISTORY OF PRESENTING ILLNESS:  Frank [name] 60 y.o.  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  Patient denies any abdominal pain.  Denies any nausea vomiting.  Complains of chronic arthritis.  No skin rash.  No diarrhea.  States to be compliant with his medication   Review of Systems  Constitutional:  Positive for malaise/fatigue. Negative for chills, diaphoresis, fever and weight loss.  HENT:  Negative for nosebleeds and sore throat.   Eyes:  Negative for double vision.  Respiratory:  Negative for cough, hemoptysis, sputum production, shortness of breath and wheezing.   Cardiovascular:  Negative for chest pain, palpitations, orthopnea and leg swelling.  Gastrointestinal:  Negative for abdominal pain, blood in stool, constipation, diarrhea, heartburn, melena, nausea and vomiting.  Genitourinary:  Negative for dysuria, frequency and urgency.  Musculoskeletal:  Positive for back pain  and joint pain.  Skin: Negative.  Negative for itching and rash.  Neurological:  Negative for dizziness, tingling, focal weakness, weakness and headaches.  Endo/Heme/Allergies:  Does not bruise/bleed easily.  Psychiatric/Behavioral:  Negative for depression. The patient is not nervous/anxious and does not have insomnia.     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  SOCIAL HISTORY: Social History   Socioeconomic History   Marital status: Single    Spouse name: Not on file   Number of children: Not on file   Years of education: Not on file   Highest education level: Not on file  Occupational History   Occupation: retired     Comment: custodian   Tobacco Use   Smoking status: Some Days    Pack years: 0.00    Types: Cigarettes   Smokeless tobacco: Never   Tobacco comments:    4 cigarettes yesterday   Substance and Sexual Activity   Alcohol use: Yes    Comment: hx of ETOH abuse   Drug use: Yes    Types: Cocaine, Marijuana    Comment: had cocaine Sat. (2 days ago)   Sexual activity: Not on file  Other Topics Concern   Not on file  Social History Narrative   Lives with S.O. [name]    Social Determinants of Health   Financial Resource Strain: Not on file  Food Insecurity: Not on file  Transportation Needs: Not on file  Physical Activity: Not on file  Stress: Not on file  Social Connections: Not on file  Intimate Partner Violence: Not on file    FAMILY HISTORY: Family History  Problem Relation Age of Onset   Diabetes Mother    Hypertension Mother    Heart failure Mother    Hyperlipidemia Mother     Thyroid disease Mother    Alcohol abuse Father    Emphysema Father    Kidney failure Father    Gout Father    Cancer Sister    Hyperlipidemia Sister    Hypertension Sister    Rheum arthritis Sister    Fibromyalgia Sister    Thyroid disease Sister     ALLERGIES:  has No Known Allergies.  MEDICATIONS:  Current Outpatient Medications  Medication Sig Dispense Refill   amLODipine (NORVASC) 10 MG tablet Take 0.5 tablets by mouth daily.     aspirin 81 MG EC tablet Take 1 tablet by mouth daily. With a meal     Brinzolamide-Brimonidine 1-0.2 % SUSP Place 1 drop into both eyes in the morning, at noon, and at bedtime.     capsaicin (ZOSTRIX) 0.025 % cream Apply 1 application topically daily as needed. APPLY SUFFICIENT AMOUNT TOPICALLY ACTIVE EVERY DAY AVOID GETTING ON FACE, IN EYES OR ON SENSITIVE AREAS. [place] HANDS THOROUGHLY AFTER USING.     Cholecalciferol 25 MCG (1000 UT) tablet Take 2 tablets by mouth daily.     dasatinib (SPRYCEL) 100 MG tablet TAKE 1 TABLET (100 MG TOTAL) BY MOUTH DAILY. 30 tablet 3   insulin aspart (NOVOLOG) 100 UNIT/ML injection Inject 100 Units into the skin continuous. INJECT 100 UNITS UNDER SKIN CONTINUOUS-VIA-PUMP AS DIRECTED FOR DIABETES.DISCARD BOTTLE 28 DAYS AFTER OPENING     insulin glargine (LANTUS) 100 UNIT/ML injection INJECT 12 UNITS UNDER SKIN EVERY DAY FOR DIABETES MELLITUS DO NOT MIX WITH OTHER INSULINS IN THE SAME SYRINGE. DISCARD BOTTLE 28 DAYS AFTER OPENING  TO REPLACE BASAL INSULIN RATE IF PUMP MALFUNCTIONS FOR DIABETES MELLITUS DO NOT MIX WITH OTHER INSULINS IN THE SAME SYRINGE. DISCARD BOTTLE 28 DAYS AFTER OPENING  TO REPLACE BASAL INSULIN RATE IF PUMP MALFUNCTIONS     latanoprost (XALATAN) 0.005 % ophthalmic solution Place 1 drop into both eyes at bedtime.     lipase/protease/amylase (CREON) 36000 UNITS CPEP capsule See admin instructions. TAKE 2 CAPSULES BY MOUTH WITH ACTIVE MEALS AND TAKE 1 CAPSULE WITH SNACK     lipase/protease/amylase (CREON)  36000 UNITS CPEP capsule Take 1 capsule by mouth daily. 2 capsules with meals and 1 capsule with snack     lisinopril-hydrochlorothiazide (ZESTORETIC) 20-12.5 MG tablet Take 1 tablet by mouth daily.     methocarbamol (ROBAXIN) 500 MG tablet Take 1 tablet (500 mg total) by mouth every 8 (eight) hours as needed for muscle spasms. 90 tablet 1   nicotine polacrilex (COMMIT) 4 MG lozenge Take 4 mg by mouth as needed. DISSOLVE 1 LOZENGE BY MOUTH AS DIRECTED - USE AT LEAST 8-10 LOZENGES EACH DAY TO START. DO NOT USE MORE THAN 20 LOZENGES     pregabalin (LYRICA) 50 MG capsule Take 1 capsule (50 mg total) by mouth 3 (three) times daily. 90 capsule 1   rosuvastatin (CRESTOR) 20 MG  tablet Take 10 mg by mouth daily.     timolol (TIMOPTIC) 0.5 % ophthalmic solution Place 1 drop into both eyes 2 (two) times daily.     nicotine (NICODERM CQ - DOSED IN MG/24 HOURS) 14 mg/24hr patch Place 14 mg onto the skin daily. APPLY ONE PATCH TO SKIN EVERY DAY ACTIVE APPLY ONE PATCH WHEN YOU WAKE UP AND REMOVE THE OLD PATCH. PEEL THE BACK OFF THE PATCH AND PUT IT ON CLEAN,DRY, HAIR-FREE SKIN ON THE UPPER ARM, CHEST OR BACK (Patient not taking: Reported on 05/07/2021)     No current facility-administered medications for this visit.      Marland Kitchen  PHYSICAL EXAMINATION: ECOG PERFORMANCE STATUS: 0 - Asymptomatic  Vitals:   05/07/21 1438  BP: 121/68  Pulse: 68  Resp: 16  Temp: 98.5 F (36.9 C)  SpO2: 100%   Filed Weights   05/07/21 1438  Weight: 183 lb (83 kg)    Physical Exam Constitutional:      Comments: Patient ambulating independently.  He is accompanied by wife/significant other.  HENT:     Head: Normocephalic and atraumatic.     Mouth/Throat:     Pharynx: No oropharyngeal exudate.  Eyes:     Pupils: Pupils are equal, round, and reactive to light.  Cardiovascular:     Rate and Rhythm: Normal rate and regular rhythm.  Pulmonary:     Effort: No respiratory distress.     Breath sounds: No wheezing.      Comments: Decreased air entry bilaterally. Abdominal:     General: Bowel sounds are normal. There is no distension.     Palpations: Abdomen is soft. There is no mass.     Tenderness: no abdominal tenderness There is no guarding or rebound.  Musculoskeletal:        General: No tenderness. Normal range of motion.     Cervical back: Normal range of motion and neck supple.  Skin:    General: Skin is warm.  Neurological:     Mental Status: He is alert and oriented to person, place, and time.  Psychiatric:        Mood and Affect: Affect normal.     LABORATORY DATA:  I have reviewed the data as listed Lab Results  Component Value Date   WBC 4.9 05/07/2021   HGB 11.4 (L) 05/07/2021   HCT 33.9 (L) 05/07/2021   MCV 90.2 05/07/2021   PLT 174 05/07/2021   Recent Labs    01/31/21 1436 04/09/21 1427 05/07/21 1419  NA 137 133* 134*  K 4.3 4.2 4.2  CL 105 104 106  CO2 23 21* 20*  GLUCOSE 153* 159* 229*  BUN 22* 20 24*  CREATININE 1.22 1.39* 1.40*  CALCIUM 9.3 9.2 9.2  GFRNONAA >60 58* 58*  PROT 7.6 7.2 7.6  ALBUMIN 4.3 3.9 4.2  AST 18 39 27  ALT 16 37 23  ALKPHOS 66 86 93  BILITOT 0.5 0.5 0.3    RADIOGRAPHIC STUDIES: I have personally reviewed the radiological images as listed and agreed with the findings in the report. No results found.  ASSESSMENT & PLAN:   Chronic myeloid leukemia ([place]) #Chronic myeloid leukemia- on dasatinib 100 mg/day [1st week of April, 2022].  Tolerating well.  #Today white count 4.3 hemoglobin 11 ; platelets normal.  Continue current dose of Dasatinib.  We will check BCR ABL RT-PCR; next visit.  #Mild anemia/CKD--hemoglobin 11 saturation 10%; would recommend p.o. iron.  #Substance drug abuse-March 2022-positive for cocaine [[facility]]-states to  quit illicit drugs.  Stable  #Diabetes-poorly controlled; currently on insulin.  Stable monitor closely.229    # DISPOSITION: # follow up in 4 weeks-MD; labs- cbc/cmp/magnesium; add bcr-abl FISH/PCR-  Dr.B  All questions were answered. The patient knows to call the clinic with any problems, questions or concerns.     Cammie Sickle, MD 05/13/2021 5:04 PM

## 2021-05-18 ENCOUNTER — Other Ambulatory Visit (HOSPITAL_COMMUNITY): Payer: Self-pay

## 2021-05-18 MED FILL — Dasatinib Tab 100 MG: ORAL | 30 days supply | Qty: 30 | Fill #2 | Status: AC

## 2021-05-21 ENCOUNTER — Telehealth: Payer: Self-pay | Admitting: Student in an Organized Health Care Education/Training Program

## 2021-05-21 ENCOUNTER — Other Ambulatory Visit (HOSPITAL_COMMUNITY): Payer: Self-pay

## 2021-05-21 NOTE — Telephone Encounter (Signed)
Patient cannot get appt with pcp until Sept and wants to know if he can get refill on his 2 meds to last until then. Please call patient and let him know

## 2021-05-21 NOTE — Telephone Encounter (Signed)
Patient informed. 

## 2021-05-21 NOTE — Telephone Encounter (Signed)
Patient asking for Methocarbamol and Pregabalin, you prescribed both  at 01/2021 appt. However, you were not able make contact with him for the next 2 scheduled appts and you ordered for him to f/u with PCP.

## 2021-05-22 ENCOUNTER — Other Ambulatory Visit (HOSPITAL_COMMUNITY): Payer: Self-pay

## 2021-06-01 ENCOUNTER — Other Ambulatory Visit: Payer: Self-pay | Admitting: *Deleted

## 2021-06-01 DIAGNOSIS — C9210 Chronic myeloid leukemia, BCR/ABL-positive, not having achieved remission: Secondary | ICD-10-CM

## 2021-06-04 ENCOUNTER — Inpatient Hospital Stay (HOSPITAL_BASED_OUTPATIENT_CLINIC_OR_DEPARTMENT_OTHER): Payer: Medicare Other | Admitting: Internal Medicine

## 2021-06-04 ENCOUNTER — Other Ambulatory Visit: Payer: Self-pay

## 2021-06-04 ENCOUNTER — Encounter: Payer: Self-pay | Admitting: Internal Medicine

## 2021-06-04 ENCOUNTER — Inpatient Hospital Stay: Payer: Medicare Other | Attending: Internal Medicine

## 2021-06-04 DIAGNOSIS — N189 Chronic kidney disease, unspecified: Secondary | ICD-10-CM | POA: Diagnosis not present

## 2021-06-04 DIAGNOSIS — C9210 Chronic myeloid leukemia, BCR/ABL-positive, not having achieved remission: Secondary | ICD-10-CM | POA: Diagnosis present

## 2021-06-04 DIAGNOSIS — D631 Anemia in chronic kidney disease: Secondary | ICD-10-CM | POA: Diagnosis not present

## 2021-06-04 DIAGNOSIS — E1122 Type 2 diabetes mellitus with diabetic chronic kidney disease: Secondary | ICD-10-CM | POA: Insufficient documentation

## 2021-06-04 LAB — CBC WITH DIFFERENTIAL/PLATELET
Abs Immature Granulocytes: 0.01 10*3/uL (ref 0.00–0.07)
Basophils Absolute: 0 10*3/uL (ref 0.0–0.1)
Basophils Relative: 0 %
Eosinophils Absolute: 0.3 10*3/uL (ref 0.0–0.5)
Eosinophils Relative: 4 %
HCT: 35.5 % — ABNORMAL LOW (ref 39.0–52.0)
Hemoglobin: 11.8 g/dL — ABNORMAL LOW (ref 13.0–17.0)
Immature Granulocytes: 0 %
Lymphocytes Relative: 16 %
Lymphs Abs: 1 10*3/uL (ref 0.7–4.0)
MCH: 30.3 pg (ref 26.0–34.0)
MCHC: 33.2 g/dL (ref 30.0–36.0)
MCV: 91 fL (ref 80.0–100.0)
Monocytes Absolute: 0.5 10*3/uL (ref 0.1–1.0)
Monocytes Relative: 8 %
Neutro Abs: 4.5 10*3/uL (ref 1.7–7.7)
Neutrophils Relative %: 72 %
Platelets: 180 10*3/uL (ref 150–400)
RBC: 3.9 MIL/uL — ABNORMAL LOW (ref 4.22–5.81)
RDW: 14.7 % (ref 11.5–15.5)
WBC: 6.3 10*3/uL (ref 4.0–10.5)
nRBC: 0 % (ref 0.0–0.2)

## 2021-06-04 LAB — MAGNESIUM: Magnesium: 2.1 mg/dL (ref 1.7–2.4)

## 2021-06-04 LAB — COMPREHENSIVE METABOLIC PANEL
ALT: 24 U/L (ref 0–44)
AST: 30 U/L (ref 15–41)
Albumin: 4.3 g/dL (ref 3.5–5.0)
Alkaline Phosphatase: 77 U/L (ref 38–126)
Anion gap: 11 (ref 5–15)
BUN: 21 mg/dL — ABNORMAL HIGH (ref 6–20)
CO2: 20 mmol/L — ABNORMAL LOW (ref 22–32)
Calcium: 9.5 mg/dL (ref 8.9–10.3)
Chloride: 105 mmol/L (ref 98–111)
Creatinine, Ser: 1.58 mg/dL — ABNORMAL HIGH (ref 0.61–1.24)
GFR, Estimated: 50 mL/min — ABNORMAL LOW (ref >60)
Glucose, Bld: 221 mg/dL — ABNORMAL HIGH (ref 70–99)
Potassium: 4.1 mmol/L (ref 3.5–5.1)
Sodium: 136 mmol/L (ref 135–145)
Total Bilirubin: 0.2 mg/dL — ABNORMAL LOW (ref 0.3–1.2)
Total Protein: 8 g/dL (ref 6.5–8.1)

## 2021-06-04 NOTE — Assessment & Plan Note (Addendum)
#  Chronic myeloid leukemia- on dasatinib 100 mg/day [1st week of April, 2022].  Tolerating well.  #Today white count 4.3 hemoglobin 11-12 ; platelets normal.  Continue current dose of Dasatinib.  Patient BCR ABL-negative for 922 abnormality.  However pending RT-PCR from today  #Mild anemia/CKD--hemoglobin 11 saturation 10%; recommend dietary [liver]-stable  #Substance drug abuse-March 2022-positive for cocaine [[facility]]-states to quit illicit drugs-stable  #Diabetes-poorly controlled; currently on insulin pump- BG- 221. .  Stable.   # DISPOSITION: # follow up in 6 weeks-MD; labs- cbc/cmp/magnesium- Dr.B

## 2021-06-04 NOTE — Progress Notes (Signed)
[place] NOTE  Patient Care Team: Administration, Veterans as PCP - General  CHIEF COMPLAINTS/PURPOSE OF CONSULTATION: CML  #  Oncology History Overview Note  Result Comment:   Comment: (NOTE)  ABNORMAL: 89% OF NUCLEI POSITIVE FOR BCR/ABL1 GENE FUSION  SIGNAL(S)     # MARCH 2022--  Hypercellular bone marrow with involvement by chronic myeloid  leukemia, BCR-ABL1-positive . he bone marrow is hypercellular for age with myeloid and megakaryocytic  hyperplasia.  There is no increase in blasts by manual differential count of aspirate smears (<1%) or by CD34 immunohistochemistry on the core biopsy.  A reticulin special stain reveals no significant increase  in reticulin fibrosis (MF grade 0 of 3).  # April 1st week -DASATINIB 100 mg    #hypertension /alcoholic liver/ type 2 diabetes on insulin poorly controlled chronic back pain    Chronic myeloid leukemia ([place])  01/31/2021 Initial Diagnosis   Chronic myeloid leukemia (HCC)      HISTORY OF PRESENTING ILLNESS:  Frank [name] 60 y.o.  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  Patient denies any nausea vomiting or abdominal pain.  Denies any changes in his other medications.  Denies any skin rash.   Review of Systems  Constitutional:  Positive for malaise/fatigue. Negative for chills, diaphoresis, fever and weight loss.  HENT:  Negative for nosebleeds and sore throat.   Eyes:  Negative for double vision.  Respiratory:  Negative for cough, hemoptysis, sputum production, shortness of breath and wheezing.   Cardiovascular:  Negative for chest pain, palpitations, orthopnea and leg swelling.  Gastrointestinal:  Negative for abdominal pain, blood in stool, constipation, diarrhea, heartburn, melena, nausea and vomiting.  Genitourinary:  Negative for dysuria, frequency and urgency.  Musculoskeletal:  Positive for back pain and joint pain.  Skin: Negative.  Negative for  itching and rash.  Neurological:  Negative for dizziness, tingling, focal weakness, weakness and headaches.  Endo/Heme/Allergies:  Does not bruise/bleed easily.  Psychiatric/Behavioral:  Negative for depression. The patient is not nervous/anxious and does not have insomnia.     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 No pertinent surgical history.  SOCIAL HISTORY: Social History   Socioeconomic History   Marital status: Single    Spouse name: Not on file   Number of children: Not on file   Years of education: Not on file   Highest education level: Not on file  Occupational History   Occupation: retired     Comment: custodian   Tobacco Use   Smoking status: Some Days    Types: Cigarettes   Smokeless tobacco: Never   Tobacco comments:    4 cigarettes yesterday   Substance and Sexual Activity   Alcohol use: Yes    Comment: hx of ETOH abuse   Drug use: Yes    Types: Cocaine, Marijuana    Comment: had cocaine Sat. (2 days ago)   Sexual activity: Not on file  Other Topics Concern   Not on file  Social History Narrative  Lives with S.O. [name]    Social Determinants of Health   Financial Resource Strain: Not on file  Food Insecurity: Not on file  Transportation Needs: Not on file  Physical Activity: Not on file  Stress: Not on file  Social Connections: Not on file  Intimate Partner Violence: Not on file    FAMILY HISTORY: Family History  Problem Relation Age of Onset   Diabetes Mother    Hypertension Mother    Heart failure Mother    Hyperlipidemia Mother    Thyroid disease Mother    Alcohol abuse Father     Emphysema Father    Kidney failure Father    Gout Father    Cancer Sister    Hyperlipidemia Sister    Hypertension Sister    Rheum arthritis Sister    Fibromyalgia Sister    Thyroid disease Sister     ALLERGIES:  has No Known Allergies.  MEDICATIONS:  Current Outpatient Medications  Medication Sig Dispense Refill   amLODipine (NORVASC) 10 MG tablet Take 0.5 tablets by mouth daily.     aspirin 81 MG EC tablet Take 1 tablet by mouth daily. With a meal     Brinzolamide-Brimonidine 1-0.2 % SUSP Place 1 drop into both eyes in the morning, at noon, and at bedtime.     capsaicin (ZOSTRIX) 0.025 % cream Apply 1 application topically daily as needed. APPLY SUFFICIENT AMOUNT TOPICALLY ACTIVE EVERY DAY AVOID GETTING ON FACE, IN EYES OR ON SENSITIVE AREAS. [place] HANDS THOROUGHLY AFTER USING.     Cholecalciferol 25 MCG (1000 UT) tablet Take 2 tablets by mouth daily.     dasatinib (SPRYCEL) 100 MG tablet TAKE 1 TABLET (100 MG TOTAL) BY MOUTH DAILY. 30 tablet 3   insulin aspart (NOVOLOG) 100 UNIT/ML injection Inject 100 Units into the skin continuous. INJECT 100 UNITS UNDER SKIN CONTINUOUS-VIA-PUMP AS DIRECTED FOR DIABETES.DISCARD BOTTLE 28 DAYS AFTER OPENING     insulin glargine (LANTUS) 100 UNIT/ML injection INJECT 12 UNITS UNDER SKIN EVERY DAY FOR DIABETES MELLITUS DO NOT MIX WITH OTHER INSULINS IN THE SAME SYRINGE. DISCARD BOTTLE 28 DAYS AFTER OPENING  TO REPLACE BASAL INSULIN RATE IF PUMP MALFUNCTIONS FOR DIABETES MELLITUS DO NOT MIX WITH OTHER INSULINS IN THE SAME SYRINGE. DISCARD BOTTLE 28 DAYS AFTER OPENING  TO REPLACE BASAL INSULIN RATE IF PUMP MALFUNCTIONS     latanoprost (XALATAN) 0.005 % ophthalmic solution Place 1 drop into both eyes at bedtime.     lipase/protease/amylase (CREON) 36000 UNITS CPEP capsule See admin instructions. TAKE 2 CAPSULES BY MOUTH WITH ACTIVE MEALS AND TAKE 1 CAPSULE WITH SNACK     lipase/protease/amylase (CREON) 36000 UNITS CPEP capsule Take 1 capsule by mouth daily.  2 capsules with meals and 1 capsule with snack     lisinopril-hydrochlorothiazide (ZESTORETIC) 20-12.5 MG tablet Take 1 tablet by mouth daily.     methocarbamol (ROBAXIN) 500 MG tablet Take 1 tablet (500 mg total) by mouth every 8 (eight) hours as needed for muscle spasms. 90 tablet 1   nicotine (NICODERM CQ - DOSED IN MG/24 HOURS) 14 mg/24hr patch Place 14 mg onto the skin daily. APPLY ONE PATCH TO SKIN EVERY DAY ACTIVE APPLY ONE PATCH WHEN YOU WAKE UP AND REMOVE THE OLD PATCH. PEEL THE BACK OFF THE PATCH AND PUT IT ON CLEAN,DRY, HAIR-FREE SKIN ON THE UPPER ARM, CHEST OR BACK     nicotine (NICODERM CQ - DOSED IN MG/24 HOURS) 21 mg/24hr patch Place onto the skin.  nicotine polacrilex (COMMIT) 4 MG lozenge Take 4 mg by mouth as needed. DISSOLVE 1 LOZENGE BY MOUTH AS DIRECTED - USE AT LEAST 8-10 LOZENGES EACH DAY TO START. DO NOT USE MORE THAN 20 LOZENGES     pregabalin (LYRICA) 50 MG capsule Take 1 capsule (50 mg total) by mouth 3 (three) times daily. 90 capsule 1   rosuvastatin (CRESTOR) 20 MG tablet Take 10 mg by mouth daily.     timolol (TIMOPTIC) 0.5 % ophthalmic solution Place 1 drop into both eyes 2 (two) times daily.     No current facility-administered medications for this visit.      Marland Kitchen  PHYSICAL EXAMINATION: ECOG PERFORMANCE STATUS: 0 - Asymptomatic  Vitals:   06/04/21 1457  BP: 135/66  Pulse: 84  Resp: 18  Temp: 98.4 F (36.9 C)  SpO2: 98%   Filed Weights   06/04/21 1457  Weight: 185 lb (83.9 kg)    Physical Exam Constitutional:      Comments: Patient ambulating independently.  He is accompanied by wife/significant other.  HENT:     Head: Normocephalic and atraumatic.     Mouth/Throat:     Pharynx: No oropharyngeal exudate.  Eyes:     Pupils: Pupils are equal, round, and reactive to light.  Cardiovascular:     Rate and Rhythm: Normal rate and regular rhythm.  Pulmonary:     Effort: No respiratory distress.     Breath sounds: No wheezing.     Comments:  Decreased air entry bilaterally. Abdominal:     General: Bowel sounds are normal. There is no distension.     Palpations: Abdomen is soft. There is no mass.     Tenderness: no abdominal tenderness There is no guarding or rebound.  Musculoskeletal:        General: No tenderness. Normal range of motion.     Cervical back: Normal range of motion and neck supple.  Skin:    General: Skin is warm.  Neurological:     Mental Status: He is alert and oriented to person, place, and time.  Psychiatric:        Mood and Affect: Affect normal.     LABORATORY DATA:  I have reviewed the data as listed Lab Results  Component Value Date   WBC 6.3 06/04/2021   HGB 11.8 (L) 06/04/2021   HCT 35.5 (L) 06/04/2021   MCV 91.0 06/04/2021   PLT 180 06/04/2021   Recent Labs    04/09/21 1427 05/07/21 1419 06/04/21 1439  NA 133* 134* 136  K 4.2 4.2 4.1  CL 104 106 105  CO2 21* 20* 20*  GLUCOSE 159* 229* 221*  BUN 20 24* 21*  CREATININE 1.39* 1.40* 1.58*  CALCIUM 9.2 9.2 9.5  GFRNONAA 58* 58* 50*  PROT 7.2 7.6 8.0  ALBUMIN 3.9 4.2 4.3  AST 39 27 30  ALT 37 23 24  ALKPHOS 86 93 77  BILITOT 0.5 0.3 0.2*    RADIOGRAPHIC STUDIES: I have personally reviewed the radiological images as listed and agreed with the findings in the report. No results found.  ASSESSMENT & PLAN:   Chronic myeloid leukemia ([place]) #Chronic myeloid leukemia- on dasatinib 100 mg/day [1st week of April, 2022].  Tolerating well.  #Today white count 4.3 hemoglobin 11-12 ; platelets normal.  Continue current dose of Dasatinib.  Patient BCR ABL-negative for 922 abnormality.  However pending RT-PCR from today  #Mild anemia/CKD--hemoglobin 11 saturation 10%; recommend dietary [liver]-stable  #Substance drug abuse-March 2022-positive for cocaine [  [facility]]-states to quit illicit drugs-stable  #Diabetes-poorly controlled; currently on insulin pump- BG- 221. .  Stable.   # DISPOSITION: # follow up in 6 weeks-MD; labs-  cbc/cmp/magnesium- Dr.B  All questions were answered. The patient knows to call the clinic with any problems, questions or concerns.     Cammie Sickle, MD 06/10/2021 9:12 PM

## 2021-06-08 LAB — BCR-ABL1 FISH
Cells Analyzed: 200
Cells Counted: 200

## 2021-06-11 ENCOUNTER — Other Ambulatory Visit: Payer: Self-pay | Admitting: Internal Medicine

## 2021-06-11 ENCOUNTER — Other Ambulatory Visit (HOSPITAL_COMMUNITY): Payer: Self-pay

## 2021-06-11 MED ORDER — DASATINIB 100 MG PO TABS
ORAL_TABLET | Freq: Every day | ORAL | 3 refills | Status: DC
  Filled 2021-06-18: qty 30, 30d supply, fill #0
  Filled 2021-07-19: qty 30, 30d supply, fill #1
  Filled 2021-08-15: qty 30, 30d supply, fill #2
  Filled 2021-09-13: qty 30, 30d supply, fill #3

## 2021-06-14 LAB — BCR-ABL1, CML/ALL, PCR, QUANT
Interpretation (BCRAL):: POSITIVE
b2a2 transcript: 0.0191 %
b3a2 transcript: 0.3559 %

## 2021-06-18 ENCOUNTER — Other Ambulatory Visit (HOSPITAL_COMMUNITY): Payer: Self-pay

## 2021-06-25 ENCOUNTER — Other Ambulatory Visit (HOSPITAL_COMMUNITY): Payer: Self-pay

## 2021-07-11 ENCOUNTER — Other Ambulatory Visit (HOSPITAL_COMMUNITY): Payer: Self-pay

## 2021-07-12 ENCOUNTER — Other Ambulatory Visit (HOSPITAL_COMMUNITY): Payer: Self-pay

## 2021-07-16 ENCOUNTER — Inpatient Hospital Stay: Payer: Medicare Other

## 2021-07-16 ENCOUNTER — Inpatient Hospital Stay: Payer: Medicare Other | Attending: Internal Medicine | Admitting: Internal Medicine

## 2021-07-16 ENCOUNTER — Encounter: Payer: Self-pay | Admitting: Internal Medicine

## 2021-07-16 ENCOUNTER — Other Ambulatory Visit: Payer: Self-pay

## 2021-07-16 DIAGNOSIS — C9210 Chronic myeloid leukemia, BCR/ABL-positive, not having achieved remission: Secondary | ICD-10-CM | POA: Insufficient documentation

## 2021-07-16 DIAGNOSIS — E1122 Type 2 diabetes mellitus with diabetic chronic kidney disease: Secondary | ICD-10-CM | POA: Insufficient documentation

## 2021-07-16 DIAGNOSIS — N189 Chronic kidney disease, unspecified: Secondary | ICD-10-CM | POA: Diagnosis not present

## 2021-07-16 DIAGNOSIS — I129 Hypertensive chronic kidney disease with stage 1 through stage 4 chronic kidney disease, or unspecified chronic kidney disease: Secondary | ICD-10-CM | POA: Insufficient documentation

## 2021-07-16 LAB — CBC WITH DIFFERENTIAL/PLATELET
Abs Immature Granulocytes: 0.02 10*3/uL (ref 0.00–0.07)
Basophils Absolute: 0 10*3/uL (ref 0.0–0.1)
Basophils Relative: 0 %
Eosinophils Absolute: 0.3 10*3/uL (ref 0.0–0.5)
Eosinophils Relative: 5 %
HCT: 35 % — ABNORMAL LOW (ref 39.0–52.0)
Hemoglobin: 11.9 g/dL — ABNORMAL LOW (ref 13.0–17.0)
Immature Granulocytes: 0 %
Lymphocytes Relative: 24 %
Lymphs Abs: 1.8 10*3/uL (ref 0.7–4.0)
MCH: 30.1 pg (ref 26.0–34.0)
MCHC: 34 g/dL (ref 30.0–36.0)
MCV: 88.6 fL (ref 80.0–100.0)
Monocytes Absolute: 0.6 10*3/uL (ref 0.1–1.0)
Monocytes Relative: 8 %
Neutro Abs: 4.6 10*3/uL (ref 1.7–7.7)
Neutrophils Relative %: 63 %
Platelets: 191 10*3/uL (ref 150–400)
RBC: 3.95 MIL/uL — ABNORMAL LOW (ref 4.22–5.81)
RDW: 14.5 % (ref 11.5–15.5)
WBC: 7.3 10*3/uL (ref 4.0–10.5)
nRBC: 0 % (ref 0.0–0.2)

## 2021-07-16 LAB — COMPREHENSIVE METABOLIC PANEL
ALT: 22 U/L (ref 0–44)
AST: 25 U/L (ref 15–41)
Albumin: 4.1 g/dL (ref 3.5–5.0)
Alkaline Phosphatase: 68 U/L (ref 38–126)
Anion gap: 7 (ref 5–15)
BUN: 29 mg/dL — ABNORMAL HIGH (ref 6–20)
CO2: 20 mmol/L — ABNORMAL LOW (ref 22–32)
Calcium: 8.9 mg/dL (ref 8.9–10.3)
Chloride: 104 mmol/L (ref 98–111)
Creatinine, Ser: 1.48 mg/dL — ABNORMAL HIGH (ref 0.61–1.24)
GFR, Estimated: 54 mL/min — ABNORMAL LOW (ref >60)
Glucose, Bld: 196 mg/dL — ABNORMAL HIGH (ref 70–99)
Potassium: 4 mmol/L (ref 3.5–5.1)
Sodium: 131 mmol/L — ABNORMAL LOW (ref 135–145)
Total Bilirubin: 0.4 mg/dL (ref 0.3–1.2)
Total Protein: 7.8 g/dL (ref 6.5–8.1)

## 2021-07-16 LAB — MAGNESIUM: Magnesium: 2.1 mg/dL (ref 1.7–2.4)

## 2021-07-16 NOTE — Assessment & Plan Note (Signed)
#  Chronic myeloid leukemia- on dasatinib 100 mg/day [1st week of April, 2022].  Tolerating well.  #Today white count 4.3 hemoglobin 11.9-; platelets normal.  Continue current dose of Dasatinib.  July 2022-FISH BCR ABL-negative for 9:22 abnormality [optimal; reached 6-monthmilestone.  July 2022 BCR ABL PCR-positive await BCR able-MMR at 12 months].    #Mild anemia/CKD--hemoglobin 11 saturation 10%; recommend dietary [liver]-STABLE.   #Substance drug abuse-March 2022-positive for cocaine [[facility]]-states to quit illicit drugs- stable  #Diabetes-poorly controlled; currently on insulin pump- BG- 196.  Stable.   # DISPOSITION: # follow up in 6 weeks-MD; labs- cbc/cmp/magnesium; BCR-ABL-RT PCR-- Dr.B

## 2021-07-16 NOTE — Progress Notes (Signed)
[place] NOTE  Patient Care Team: Administration, Veterans as PCP - General  CHIEF COMPLAINTS/PURPOSE OF CONSULTATION: CML  #  Oncology History Overview Note  Result Comment:   Comment: (NOTE)  ABNORMAL: 89% OF NUCLEI POSITIVE FOR BCR/ABL1 GENE FUSION  SIGNAL(S)     # MARCH 2022--  Hypercellular bone marrow with involvement by chronic myeloid  leukemia, BCR-ABL1-positive . he bone marrow is hypercellular for age with myeloid and megakaryocytic  hyperplasia.  There is no increase in blasts by manual differential count of aspirate smears (<1%) or by CD34 immunohistochemistry on the core biopsy.  A reticulin special stain reveals no significant increase  in reticulin fibrosis (MF grade 0 of 3).  # April 1st week -DASATINIB 100 mg    #hypertension /alcoholic liver/ type 2 diabetes on insulin poorly controlled chronic back pain    Chronic myeloid leukemia ([place])  01/31/2021 Initial Diagnosis   Chronic myeloid leukemia (HCC)      HISTORY OF PRESENTING ILLNESS:  Frank [name] 60 y.o.  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  Patient denies any nausea vomiting.  Denies abdominal pain.  No fevers or chills.  No skin rash.  Mild reflux improved with antacids.  Patient takes antacid about 2 hours after taking the Dasatinib.  Review of Systems  Constitutional:  Positive for malaise/fatigue. Negative for chills, diaphoresis, fever and weight loss.  HENT:  Negative for nosebleeds and sore throat.   Eyes:  Negative for double vision.  Respiratory:  Negative for cough, hemoptysis, sputum production, shortness of breath and wheezing.   Cardiovascular:  Negative for chest pain, palpitations, orthopnea and leg swelling.  Gastrointestinal:  Negative for abdominal pain, blood in stool, constipation, diarrhea, heartburn, melena, nausea and vomiting.  Genitourinary:  Negative for dysuria, frequency and urgency.   Musculoskeletal:  Positive for back pain and joint pain.  Skin: Negative.  Negative for itching and rash.  Neurological:  Negative for dizziness, tingling, focal weakness, weakness and headaches.  Endo/Heme/Allergies:  Does not bruise/bleed easily.  Psychiatric/Behavioral:  Negative for depression. The patient is not nervous/anxious and does not have insomnia.     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 No pertinent surgical history.  SOCIAL HISTORY: Social History   Socioeconomic History   Marital status: Single    Spouse name: Not on file   Number of children: Not on file   Years of education: Not on file   Highest education level: Not on file  Occupational History   Occupation: retired     Comment: custodian   Tobacco Use   Smoking status: Some Days    Types: Cigarettes   Smokeless tobacco: Never   Tobacco comments:    4 cigarettes yesterday   Substance and Sexual Activity   Alcohol use: Yes    Comment: hx of ETOH abuse   Drug use: Yes    Types: Cocaine, Marijuana    Comment: had cocaine Sat. (2 days ago)   Sexual activity: Not on file  Other  Topics Concern   Not on file  Social History Narrative   Lives with S.O. [name]    Social Determinants of Health   Financial Resource Strain: Not on file  Food Insecurity: Not on file  Transportation Needs: Not on file  Physical Activity: Not on file  Stress: Not on file  Social Connections: Not on file  Intimate Partner Violence: Not on file    FAMILY HISTORY: Family History  Problem Relation Age of Onset   Diabetes Mother    Hypertension Mother    Heart failure  Mother    Hyperlipidemia Mother    Thyroid disease Mother    Alcohol abuse Father    Emphysema Father    Kidney failure Father    Gout Father    Cancer Sister    Hyperlipidemia Sister    Hypertension Sister    Rheum arthritis Sister    Fibromyalgia Sister    Thyroid disease Sister     ALLERGIES:  has No Known Allergies.  MEDICATIONS:  Current Outpatient Medications  Medication Sig Dispense Refill   amLODipine (NORVASC) 10 MG tablet Take 0.5 tablets by mouth daily.     aspirin 81 MG EC tablet Take 1 tablet by mouth daily. With a meal     Brinzolamide-Brimonidine 1-0.2 % SUSP Place 1 drop into both eyes in the morning, at noon, and at bedtime.     Cholecalciferol 25 MCG (1000 UT) tablet Take 2 tablets by mouth daily.     dasatinib (SPRYCEL) 100 MG tablet TAKE 1 TABLET (100 MG TOTAL) BY MOUTH DAILY. 30 tablet 3   gabapentin (NEURONTIN) 300 MG capsule Take 300 mg by mouth. Take 2 capsules by mouth in the morning, at noon, and at bedtime.     insulin aspart (NOVOLOG) 100 UNIT/ML injection Inject 100 Units into the skin continuous. INJECT 100 UNITS UNDER SKIN CONTINUOUS-VIA-PUMP AS DIRECTED FOR DIABETES.DISCARD BOTTLE 28 DAYS AFTER OPENING     latanoprost (XALATAN) 0.005 % ophthalmic solution Place 1 drop into both eyes at bedtime.     lipase/protease/amylase (CREON) 36000 UNITS CPEP capsule See admin instructions. TAKE 2 CAPSULES BY MOUTH WITH ACTIVE MEALS AND TAKE 1 CAPSULE WITH SNACK     lisinopril-hydrochlorothiazide (ZESTORETIC) 20-12.5 MG tablet Take 1 tablet by mouth daily.     methocarbamol (ROBAXIN) 500 MG tablet Take 1 tablet (500 mg total) by mouth every 8 (eight) hours as needed for muscle spasms. 90 tablet 1   nicotine (NICODERM CQ - DOSED IN MG/24 HOURS) 14 mg/24hr patch Place 14 mg onto the skin daily. APPLY ONE PATCH TO SKIN EVERY DAY ACTIVE APPLY ONE PATCH WHEN YOU WAKE UP AND REMOVE THE OLD PATCH. PEEL THE BACK OFF THE PATCH AND PUT IT ON CLEAN,DRY, HAIR-FREE SKIN ON  THE UPPER ARM, CHEST OR BACK     nicotine (NICODERM CQ - DOSED IN MG/24 HOURS) 21 mg/24hr patch Place onto the skin.     nicotine polacrilex (COMMIT) 4 MG lozenge Take 4 mg by mouth as needed. DISSOLVE 1 LOZENGE BY MOUTH AS DIRECTED - USE AT LEAST 8-10 LOZENGES EACH DAY TO START. DO NOT USE MORE THAN 20 LOZENGES     rosuvastatin (CRESTOR) 20 MG tablet Take 10 mg by mouth daily.     timolol (TIMOPTIC) 0.5 % ophthalmic solution Place 1 drop into both eyes 2 (two) times daily.     insulin glargine (LANTUS) 100 UNIT/ML injection INJECT 12 UNITS UNDER SKIN EVERY DAY FOR DIABETES MELLITUS DO NOT MIX WITH  OTHER INSULINS IN THE SAME SYRINGE. DISCARD BOTTLE 28 DAYS AFTER OPENING  TO REPLACE BASAL INSULIN RATE IF PUMP MALFUNCTIONS FOR DIABETES MELLITUS DO NOT MIX WITH OTHER INSULINS IN THE SAME SYRINGE. DISCARD BOTTLE 28 DAYS AFTER OPENING  TO REPLACE BASAL INSULIN RATE IF PUMP MALFUNCTIONS (Patient not taking: Reported on 07/16/2021)     pregabalin (LYRICA) 50 MG capsule Take 1 capsule (50 mg total) by mouth 3 (three) times daily. (Patient not taking: Reported on 07/16/2021) 90 capsule 1   No current facility-administered medications for this visit.      Marland Kitchen  PHYSICAL EXAMINATION: ECOG PERFORMANCE STATUS: 0 - Asymptomatic  Vitals:   07/16/21 1445  BP: 106/64  Pulse: 80  Resp: 18  Temp: 98.5 F (36.9 C)   Filed Weights   07/16/21 1448  Weight: 183 lb 12.8 oz (83.4 kg)    Physical Exam Constitutional:      Comments: Patient ambulating independently.  He is accompanied by wife/significant other.  HENT:     Head: Normocephalic and atraumatic.     Mouth/Throat:     Pharynx: No oropharyngeal exudate.  Eyes:     Pupils: Pupils are equal, round, and reactive to light.  Cardiovascular:     Rate and Rhythm: Normal rate and regular rhythm.  Pulmonary:     Effort: No respiratory distress.     Breath sounds: No wheezing.     Comments: Decreased air entry bilaterally. Abdominal:     General:  Bowel sounds are normal. There is no distension.     Palpations: Abdomen is soft. There is no mass.     Tenderness: There is no abdominal tenderness. There is no guarding or rebound.  Musculoskeletal:        General: No tenderness. Normal range of motion.     Cervical back: Normal range of motion and neck supple.  Skin:    General: Skin is warm.  Neurological:     Mental Status: He is alert and oriented to person, place, and time.  Psychiatric:        Mood and Affect: Affect normal.     LABORATORY DATA:  I have reviewed the data as listed Lab Results  Component Value Date   WBC 7.3 07/16/2021   HGB 11.9 (L) 07/16/2021   HCT 35.0 (L) 07/16/2021   MCV 88.6 07/16/2021   PLT 191 07/16/2021   Recent Labs    05/07/21 1419 06/04/21 1439 07/16/21 1434  NA 134* 136 131*  K 4.2 4.1 4.0  CL 106 105 104  CO2 20* 20* 20*  GLUCOSE 229* 221* 196*  BUN 24* 21* 29*  CREATININE 1.40* 1.58* 1.48*  CALCIUM 9.2 9.5 8.9  GFRNONAA 58* 50* 54*  PROT 7.6 8.0 7.8  ALBUMIN 4.2 4.3 4.1  AST $Re'27 30 25  'zja$ ALT $R'23 24 22  'cx$ ALKPHOS 93 77 68  BILITOT 0.3 0.2* 0.4    RADIOGRAPHIC STUDIES: I have personally reviewed the radiological images as listed and agreed with the findings in the report. No results found.  ASSESSMENT & PLAN:   Chronic myeloid leukemia ([place]) #Chronic myeloid leukemia- on dasatinib 100 mg/day [1st week of April, 2022].  Tolerating well.  #Today white count 4.3 hemoglobin 11.9-; platelets normal.  Continue current dose of Dasatinib.  July 2022-FISH BCR ABL-negative for 9:22 abnormality [optimal; reached 17-month milestone.  July 2022 BCR ABL PCR-positive await BCR able-MMR at 12 months].    #Mild anemia/CKD--hemoglobin 11 saturation 10%; recommend dietary [liver]-STABLE.   #Substance drug abuse-March  2022-positive for cocaine [[facility]]-states to quit illicit drugs- stable  #Diabetes-poorly controlled; currently on insulin pump- BG- 196.  Stable.   # DISPOSITION: # follow up in  6 weeks-MD; labs- cbc/cmp/magnesium; BCR-ABL-RT PCR-- Dr.B  All questions were answered. The patient knows to call the clinic with any problems, questions or concerns.     Cammie Sickle, MD 07/16/2021 3:16 PM

## 2021-07-19 ENCOUNTER — Other Ambulatory Visit (HOSPITAL_COMMUNITY): Payer: Self-pay

## 2021-08-14 ENCOUNTER — Other Ambulatory Visit (HOSPITAL_COMMUNITY): Payer: Self-pay

## 2021-08-15 ENCOUNTER — Other Ambulatory Visit (HOSPITAL_COMMUNITY): Payer: Self-pay

## 2021-08-16 ENCOUNTER — Other Ambulatory Visit (HOSPITAL_COMMUNITY): Payer: Self-pay

## 2021-08-21 ENCOUNTER — Other Ambulatory Visit (HOSPITAL_COMMUNITY): Payer: Self-pay

## 2021-08-23 ENCOUNTER — Other Ambulatory Visit: Payer: Self-pay | Admitting: *Deleted

## 2021-08-23 DIAGNOSIS — C9210 Chronic myeloid leukemia, BCR/ABL-positive, not having achieved remission: Secondary | ICD-10-CM

## 2021-08-27 ENCOUNTER — Encounter: Payer: Self-pay | Admitting: Internal Medicine

## 2021-08-27 ENCOUNTER — Inpatient Hospital Stay (HOSPITAL_BASED_OUTPATIENT_CLINIC_OR_DEPARTMENT_OTHER): Payer: Medicare Other | Admitting: Internal Medicine

## 2021-08-27 ENCOUNTER — Inpatient Hospital Stay: Payer: Medicare Other | Attending: Internal Medicine

## 2021-08-27 DIAGNOSIS — D649 Anemia, unspecified: Secondary | ICD-10-CM | POA: Insufficient documentation

## 2021-08-27 DIAGNOSIS — N189 Chronic kidney disease, unspecified: Secondary | ICD-10-CM | POA: Insufficient documentation

## 2021-08-27 DIAGNOSIS — E1122 Type 2 diabetes mellitus with diabetic chronic kidney disease: Secondary | ICD-10-CM | POA: Insufficient documentation

## 2021-08-27 DIAGNOSIS — Z794 Long term (current) use of insulin: Secondary | ICD-10-CM | POA: Insufficient documentation

## 2021-08-27 DIAGNOSIS — I129 Hypertensive chronic kidney disease with stage 1 through stage 4 chronic kidney disease, or unspecified chronic kidney disease: Secondary | ICD-10-CM | POA: Insufficient documentation

## 2021-08-27 DIAGNOSIS — C9210 Chronic myeloid leukemia, BCR/ABL-positive, not having achieved remission: Secondary | ICD-10-CM

## 2021-08-27 LAB — COMPREHENSIVE METABOLIC PANEL
ALT: 20 U/L (ref 0–44)
AST: 28 U/L (ref 15–41)
Albumin: 4.4 g/dL (ref 3.5–5.0)
Alkaline Phosphatase: 65 U/L (ref 38–126)
Anion gap: 8 (ref 5–15)
BUN: 31 mg/dL — ABNORMAL HIGH (ref 8–23)
CO2: 19 mmol/L — ABNORMAL LOW (ref 22–32)
Calcium: 9 mg/dL (ref 8.9–10.3)
Chloride: 106 mmol/L (ref 98–111)
Creatinine, Ser: 1.66 mg/dL — ABNORMAL HIGH (ref 0.61–1.24)
GFR, Estimated: 47 mL/min — ABNORMAL LOW (ref >60)
Glucose, Bld: 162 mg/dL — ABNORMAL HIGH (ref 70–99)
Potassium: 4.2 mmol/L (ref 3.5–5.1)
Sodium: 133 mmol/L — ABNORMAL LOW (ref 135–145)
Total Bilirubin: 0.3 mg/dL (ref 0.3–1.2)
Total Protein: 8.4 g/dL — ABNORMAL HIGH (ref 6.5–8.1)

## 2021-08-27 LAB — MAGNESIUM: Magnesium: 2.1 mg/dL (ref 1.7–2.4)

## 2021-08-27 LAB — CBC WITH DIFFERENTIAL/PLATELET
Abs Immature Granulocytes: 0.01 10*3/uL (ref 0.00–0.07)
Basophils Absolute: 0 10*3/uL (ref 0.0–0.1)
Basophils Relative: 0 %
Eosinophils Absolute: 0.5 10*3/uL (ref 0.0–0.5)
Eosinophils Relative: 7 %
HCT: 32.8 % — ABNORMAL LOW (ref 39.0–52.0)
Hemoglobin: 11.3 g/dL — ABNORMAL LOW (ref 13.0–17.0)
Immature Granulocytes: 0 %
Lymphocytes Relative: 23 %
Lymphs Abs: 1.5 10*3/uL (ref 0.7–4.0)
MCH: 30.4 pg (ref 26.0–34.0)
MCHC: 34.5 g/dL (ref 30.0–36.0)
MCV: 88.2 fL (ref 80.0–100.0)
Monocytes Absolute: 0.6 10*3/uL (ref 0.1–1.0)
Monocytes Relative: 10 %
Neutro Abs: 3.9 10*3/uL (ref 1.7–7.7)
Neutrophils Relative %: 60 %
Platelets: 193 10*3/uL (ref 150–400)
RBC: 3.72 MIL/uL — ABNORMAL LOW (ref 4.22–5.81)
RDW: 15 % (ref 11.5–15.5)
WBC: 6.5 10*3/uL (ref 4.0–10.5)
nRBC: 0 % (ref 0.0–0.2)

## 2021-08-27 NOTE — Progress Notes (Signed)
[place] NOTE  Patient Care Team: Administration, Veterans as PCP - General  CHIEF COMPLAINTS/PURPOSE OF CONSULTATION: CML  #  Oncology History Overview Note  Result Comment:   Comment: (NOTE)  ABNORMAL: 89% OF NUCLEI POSITIVE FOR BCR/ABL1 GENE FUSION  SIGNAL(S)     # MARCH 2022--  Hypercellular bone marrow with involvement by chronic myeloid  leukemia, BCR-ABL1-positive . he bone marrow is hypercellular for age with myeloid and megakaryocytic  hyperplasia.  There is no increase in blasts by manual differential count of aspirate smears (<1%) or by CD34 immunohistochemistry on the core biopsy.  A reticulin special stain reveals no significant increase  in reticulin fibrosis (MF grade 0 of 3).  # April 1st week -DASATINIB 100 mg    #hypertension /alcoholic liver/ type 2 diabetes on insulin poorly controlled chronic back pain    Chronic myeloid leukemia ([place])  01/31/2021 Initial Diagnosis   Chronic myeloid leukemia (HCC)      HISTORY OF PRESENTING ILLNESS: Ambulating independently.  Accompanied by his wife. Frank [name] 60 y.o.  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  Patient denies any nausea vomiting.  No skin rash.  No abdominal pain.  Admits to taking his pills on time/daily.   Review of Systems  Constitutional:  Positive for malaise/fatigue. Negative for chills, diaphoresis, fever and weight loss.  HENT:  Negative for nosebleeds and sore throat.   Eyes:  Negative for double vision.  Respiratory:  Negative for cough, hemoptysis, sputum production, shortness of breath and wheezing.   Cardiovascular:  Negative for chest pain, palpitations, orthopnea and leg swelling.  Gastrointestinal:  Negative for abdominal pain, blood in stool, constipation, diarrhea, heartburn, melena, nausea and vomiting.  Genitourinary:  Negative for dysuria, frequency and urgency.  Musculoskeletal:  Positive for back pain and  joint pain.  Skin: Negative.  Negative for itching and rash.  Neurological:  Negative for dizziness, tingling, focal weakness, weakness and headaches.  Endo/Heme/Allergies:  Does not bruise/bleed easily.  Psychiatric/Behavioral:  Negative for depression. The patient is not nervous/anxious and does not have insomnia.     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 No pertinent surgical history.  SOCIAL HISTORY: Social History   Socioeconomic History   Marital status: Single    Spouse name: Not on file   Number of children: Not on file   Years of education: Not on file   Highest education level: Not on file  Occupational History   Occupation: retired     Comment: custodian   Tobacco Use   Smoking status: Some Days    Types: Cigarettes   Smokeless tobacco: Never   Tobacco comments:    4 cigarettes yesterday   Substance and Sexual Activity   Alcohol use: Yes    Comment: hx of ETOH abuse   Drug use: Yes    Types: Cocaine, Marijuana    Comment: had cocaine Sat. (2 days ago)   Sexual activity: Not on file  Other Topics Concern   Not on file  Social History Narrative   Lives with S.O. [name]    Social Determinants of Health   Financial Resource Strain: Not on file  Food Insecurity: Not on file  Transportation Needs: Not on file  Physical Activity: Not on file  Stress: Not on file  Social Connections: Not on file  Intimate Partner Violence: Not on file    FAMILY HISTORY: Family History  Problem Relation Age of Onset   Diabetes Mother    Hypertension Mother    Heart failure Mother    Hyperlipidemia Mother    Thyroid  disease Mother    Alcohol abuse Father    Emphysema Father    Kidney failure Father    Gout Father    Cancer Sister    Hyperlipidemia Sister    Hypertension Sister    Rheum arthritis Sister    Fibromyalgia Sister    Thyroid disease Sister     ALLERGIES:  has No Known Allergies.  MEDICATIONS:  Current Outpatient Medications  Medication Sig Dispense Refill   amLODipine (NORVASC) 10 MG tablet Take 0.5 tablets by mouth daily.     aspirin 81 MG EC tablet Take 1 tablet by mouth daily. With a meal     Brinzolamide-Brimonidine 1-0.2 % SUSP Place 1 drop into both eyes in the morning, at noon, and at bedtime.     Cholecalciferol 25 MCG (1000 UT) tablet Take 2 tablets by mouth daily.     dasatinib (SPRYCEL) 100 MG tablet TAKE 1 TABLET (100 MG TOTAL) BY MOUTH DAILY. 30 tablet 3   fluticasone (FLONASE) 50 MCG/ACT nasal spray INSTILL 1 SPRAY INTO EACH NOSTRIL EVERY DAY AS NEEDED MAXIMUM 2 SPRAYS IN EACH NOSTRIL DAILY. FOR ALLERGIES     gabapentin (NEURONTIN) 300 MG capsule Take 300 mg by mouth. Take 2 capsules by mouth in the morning, at noon, and at bedtime.     guaiFENesin (MUCINEX) 600 MG 12 hr tablet TAKE ONE TABLET BY MOUTH TWO TIMES A DAY AS NEEDED FOR SINUS CONGESTION     insulin aspart (NOVOLOG) 100 UNIT/ML injection Inject 100 Units into the skin continuous. INJECT 100 UNITS UNDER SKIN CONTINUOUS-VIA-PUMP AS DIRECTED FOR DIABETES.DISCARD BOTTLE 28 DAYS AFTER OPENING     insulin glargine (LANTUS) 100 UNIT/ML injection      latanoprost (XALATAN) 0.005 % ophthalmic solution Place 1 drop into both eyes at bedtime.     lipase/protease/amylase (CREON) 36000 UNITS CPEP capsule See admin instructions. TAKE 2 CAPSULES BY MOUTH WITH ACTIVE MEALS AND TAKE 1 CAPSULE WITH SNACK     lisinopril-hydrochlorothiazide (ZESTORETIC) 20-12.5 MG tablet Take 1 tablet by mouth daily.     methocarbamol (ROBAXIN) 500 MG tablet Take 1 tablet (500 mg total) by mouth every 8 (eight) hours as needed for muscle spasms.  90 tablet 1   nicotine (NICODERM CQ - DOSED IN MG/24 HOURS) 14 mg/24hr patch Place 14 mg onto the skin daily. APPLY ONE PATCH TO SKIN EVERY DAY ACTIVE APPLY ONE PATCH WHEN YOU WAKE UP AND REMOVE THE OLD PATCH. PEEL THE BACK OFF THE PATCH AND PUT IT ON CLEAN,DRY, HAIR-FREE SKIN ON THE UPPER ARM, CHEST OR BACK     nicotine (NICODERM CQ - DOSED IN MG/24 HOURS) 21 mg/24hr patch Place onto the skin.     nicotine polacrilex (COMMIT) 4 MG lozenge Take 4 mg by mouth as needed. DISSOLVE 1 LOZENGE BY MOUTH AS DIRECTED - USE AT LEAST 8-10 LOZENGES EACH DAY TO START. DO NOT USE MORE THAN 20 LOZENGES  pregabalin (LYRICA) 50 MG capsule Take 1 capsule (50 mg total) by mouth 3 (three) times daily. 90 capsule 1   rosuvastatin (CRESTOR) 20 MG tablet Take 10 mg by mouth daily.     sodium chloride (OCEAN) 0.65 % nasal spray SPRAY 2 SPRAYS INTO EACH NOSTRIL FOUR TIMES A DAY FOR SINUS CONGESTION     timolol (TIMOPTIC) 0.5 % ophthalmic solution Place 1 drop into both eyes 2 (two) times daily.     No current facility-administered medications for this visit.      Marland Kitchen  PHYSICAL EXAMINATION: ECOG PERFORMANCE STATUS: 0 - Asymptomatic  Vitals:   08/27/21 1508  BP: 119/69  Pulse: 91  Resp: 20  Temp: 97.8 F (36.6 C)  SpO2: 100%   Filed Weights   08/27/21 1508  Weight: 182 lb 3.2 oz (82.6 kg)    Physical Exam Constitutional:      Comments: Patient ambulating independently.  He is accompanied by wife/significant other.  HENT:     Head: Normocephalic and atraumatic.     Mouth/Throat:     Pharynx: No oropharyngeal exudate.  Eyes:     Pupils: Pupils are equal, round, and reactive to light.  Cardiovascular:     Rate and Rhythm: Normal rate and regular rhythm.  Pulmonary:     Effort: No respiratory distress.     Breath sounds: No wheezing.     Comments: Decreased air entry bilaterally. Abdominal:     General: Bowel sounds are normal. There is no distension.     Palpations: Abdomen is soft. There is  no mass.     Tenderness: There is no abdominal tenderness. There is no guarding or rebound.  Musculoskeletal:        General: No tenderness. Normal range of motion.     Cervical back: Normal range of motion and neck supple.  Skin:    General: Skin is warm.  Neurological:     Mental Status: He is alert and oriented to person, place, and time.  Psychiatric:        Mood and Affect: Affect normal.     LABORATORY DATA:  I have reviewed the data as listed Lab Results  Component Value Date   WBC 6.5 08/27/2021   HGB 11.3 (L) 08/27/2021   HCT 32.8 (L) 08/27/2021   MCV 88.2 08/27/2021   PLT 193 08/27/2021   Recent Labs    06/04/21 1439 07/16/21 1434 08/27/21 1438  NA 136 131* 133*  K 4.1 4.0 4.2  CL 105 104 106  CO2 20* 20* 19*  GLUCOSE 221* 196* 162*  BUN 21* 29* 31*  CREATININE 1.58* 1.48* 1.66*  CALCIUM 9.5 8.9 9.0  GFRNONAA 50* 54* 47*  PROT 8.0 7.8 8.4*  ALBUMIN 4.3 4.1 4.4  AST $Re'30 25 28  'phw$ ALT $R'24 22 20  'Di$ ALKPHOS 77 68 65  BILITOT 0.2* 0.4 0.3    RADIOGRAPHIC STUDIES: I have personally reviewed the radiological images as listed and agreed with the findings in the report. No results found.  ASSESSMENT & PLAN:   Chronic myeloid leukemia ([place]) #Chronic myeloid leukemia- on dasatinib 100 mg/day [1st week of April, 2022].  Tolerating well.  #Today white count 4.3 hemoglobin 11.9-; platelets normal.  Continue current dose of Dasatinib.  July 2022-FISH BCR ABL-negative for 9:22 abnormality [optimal; reached 37-month milestone.  July 2022 BCR ABL PCR-positive await BCR able-MMR at 12 months].    #Mild anemia/CKD--hemoglobin 11 saturation 10%; recommend dietary [liver]-STABLE.  #Substance drug abuse-March 2022-positive for cocaine [[facility]]-states  to quit illicit drugs- STABLE  #Diabetes-poorly controlled; currently on insulin pump- BG- 162 STABLE  # DISPOSITION: # follow up in 6 weeks-MD; labs- cbc/cmp/magnesium-- Dr.B  All questions were answered. The patient knows to  call the clinic with any problems, questions or concerns.     Cammie Sickle, MD 08/27/2021 4:07 PM

## 2021-08-27 NOTE — Assessment & Plan Note (Addendum)
#  Chronic myeloid leukemia- on dasatinib 100 mg/day [1st week of April, 2022].  Tolerating well.  #Today white count 4.3 hemoglobin 11.9-; platelets normal.  Continue current dose of Dasatinib.  July 2022-FISH BCR ABL-negative for 9:22 abnormality [optimal; reached 36-month milestone.  July 2022 BCR ABL PCR-positive await BCR able-MMR at 12 months].  Await BCR ABL testing from today.  #Mild anemia/CKD--hemoglobin 11 saturation 10%; recommend dietary [liver]-STABLE.  #Substance drug abuse-March 2022-positive for cocaine [[facility]]-states to quit illicit drugs- STABLE  #Diabetes-poorly controlled; currently on insulin pump- BG- 162 STABLE  # DISPOSITION: # follow up in 6 weeks-MD; labs- cbc/cmp/magnesium-- Dr.B

## 2021-08-30 LAB — BCR-ABL1 FISH
Cells Analyzed: 200
Cells Counted: 200

## 2021-09-09 LAB — BCR-ABL1, CML/ALL, PCR, QUANT: b3a2 transcript: 0.0321 %

## 2021-09-13 ENCOUNTER — Other Ambulatory Visit (HOSPITAL_COMMUNITY): Payer: Self-pay

## 2021-09-19 ENCOUNTER — Other Ambulatory Visit (HOSPITAL_COMMUNITY): Payer: Self-pay

## 2021-10-08 ENCOUNTER — Encounter: Payer: Self-pay | Admitting: Internal Medicine

## 2021-10-08 ENCOUNTER — Inpatient Hospital Stay (HOSPITAL_BASED_OUTPATIENT_CLINIC_OR_DEPARTMENT_OTHER): Payer: Medicare Other | Admitting: Internal Medicine

## 2021-10-08 ENCOUNTER — Inpatient Hospital Stay: Payer: Medicare Other | Attending: Internal Medicine

## 2021-10-08 ENCOUNTER — Other Ambulatory Visit: Payer: Self-pay

## 2021-10-08 DIAGNOSIS — E1122 Type 2 diabetes mellitus with diabetic chronic kidney disease: Secondary | ICD-10-CM | POA: Insufficient documentation

## 2021-10-08 DIAGNOSIS — C9210 Chronic myeloid leukemia, BCR/ABL-positive, not having achieved remission: Secondary | ICD-10-CM | POA: Insufficient documentation

## 2021-10-08 DIAGNOSIS — D631 Anemia in chronic kidney disease: Secondary | ICD-10-CM | POA: Insufficient documentation

## 2021-10-08 DIAGNOSIS — I129 Hypertensive chronic kidney disease with stage 1 through stage 4 chronic kidney disease, or unspecified chronic kidney disease: Secondary | ICD-10-CM | POA: Insufficient documentation

## 2021-10-08 DIAGNOSIS — N189 Chronic kidney disease, unspecified: Secondary | ICD-10-CM | POA: Diagnosis not present

## 2021-10-08 LAB — COMPREHENSIVE METABOLIC PANEL
ALT: 21 U/L (ref 0–44)
AST: 27 U/L (ref 15–41)
Albumin: 4.4 g/dL (ref 3.5–5.0)
Alkaline Phosphatase: 70 U/L (ref 38–126)
Anion gap: 8 (ref 5–15)
BUN: 23 mg/dL (ref 8–23)
CO2: 25 mmol/L (ref 22–32)
Calcium: 9 mg/dL (ref 8.9–10.3)
Chloride: 103 mmol/L (ref 98–111)
Creatinine, Ser: 1.39 mg/dL — ABNORMAL HIGH (ref 0.61–1.24)
GFR, Estimated: 58 mL/min — ABNORMAL LOW (ref >60)
Glucose, Bld: 170 mg/dL — ABNORMAL HIGH (ref 70–99)
Potassium: 3.9 mmol/L (ref 3.5–5.1)
Sodium: 136 mmol/L (ref 135–145)
Total Bilirubin: 0.2 mg/dL — ABNORMAL LOW (ref 0.3–1.2)
Total Protein: 8.2 g/dL — ABNORMAL HIGH (ref 6.5–8.1)

## 2021-10-08 LAB — CBC WITH DIFFERENTIAL/PLATELET
Abs Immature Granulocytes: 0.02 10*3/uL (ref 0.00–0.07)
Basophils Absolute: 0 10*3/uL (ref 0.0–0.1)
Basophils Relative: 1 %
Eosinophils Absolute: 0.5 10*3/uL (ref 0.0–0.5)
Eosinophils Relative: 7 %
HCT: 34.9 % — ABNORMAL LOW (ref 39.0–52.0)
Hemoglobin: 11.7 g/dL — ABNORMAL LOW (ref 13.0–17.0)
Immature Granulocytes: 0 %
Lymphocytes Relative: 17 %
Lymphs Abs: 1.3 10*3/uL (ref 0.7–4.0)
MCH: 31.5 pg (ref 26.0–34.0)
MCHC: 33.5 g/dL (ref 30.0–36.0)
MCV: 94.1 fL (ref 80.0–100.0)
Monocytes Absolute: 0.6 10*3/uL (ref 0.1–1.0)
Monocytes Relative: 8 %
Neutro Abs: 5.3 10*3/uL (ref 1.7–7.7)
Neutrophils Relative %: 67 %
Platelets: 188 10*3/uL (ref 150–400)
RBC: 3.71 MIL/uL — ABNORMAL LOW (ref 4.22–5.81)
RDW: 15.3 % (ref 11.5–15.5)
WBC: 7.8 10*3/uL (ref 4.0–10.5)
nRBC: 0 % (ref 0.0–0.2)

## 2021-10-08 LAB — MAGNESIUM: Magnesium: 2.1 mg/dL (ref 1.7–2.4)

## 2021-10-08 NOTE — Progress Notes (Signed)
[place] NOTE  Patient Care Team: Administration, Veterans as PCP - General  CHIEF COMPLAINTS/PURPOSE OF CONSULTATION: CML  #  Oncology History Overview Note  Result Comment:   Comment: (NOTE)  ABNORMAL: 89% OF NUCLEI POSITIVE FOR BCR/ABL1 GENE FUSION  SIGNAL(S)     # MARCH 2022--  Hypercellular bone marrow with involvement by chronic myeloid  leukemia, BCR-ABL1-positive . he bone marrow is hypercellular for age with myeloid and megakaryocytic  hyperplasia.  There is no increase in blasts by manual differential count of aspirate smears (<1%) or by CD34 immunohistochemistry on the core biopsy.  A reticulin special stain reveals no significant increase  in reticulin fibrosis (MF grade 0 of 3).  # April 1st week -DASATINIB 100 mg    #hypertension /alcoholic liver/ type 2 diabetes on insulin poorly controlled chronic back pain    Chronic myeloid leukemia ([place])  01/31/2021 Initial Diagnosis   Chronic myeloid leukemia (HCC)      HISTORY OF PRESENTING ILLNESS: Ambulating independently.  Accompanied by his wife.  Frank [name] 60 y.o.  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  Denies any nausea vomiting or diarrhea.  No fever no chills.  No abdominal pain.  Admits to taking his pills on time/daily.   Review of Systems  Constitutional:  Positive for malaise/fatigue. Negative for chills, diaphoresis, fever and weight loss.  HENT:  Negative for nosebleeds and sore throat.   Eyes:  Negative for double vision.  Respiratory:  Negative for cough, hemoptysis, sputum production, shortness of breath and wheezing.   Cardiovascular:  Negative for chest pain, palpitations, orthopnea and leg swelling.  Gastrointestinal:  Negative for abdominal pain, blood in stool, constipation, diarrhea, heartburn, melena, nausea and vomiting.  Genitourinary:  Negative for dysuria, frequency and urgency.  Musculoskeletal:  Positive for  back pain and joint pain.  Skin: Negative.  Negative for itching and rash.  Neurological:  Negative for dizziness, tingling, focal weakness, weakness and headaches.  Endo/Heme/Allergies:  Does not bruise/bleed easily.  Psychiatric/Behavioral:  Negative for depression. The patient is not nervous/anxious and does not have insomnia.     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 No pertinent surgical history.  SOCIAL HISTORY: Social History   Socioeconomic History   Marital status: Single    Spouse name: Not on file   Number of children: Not on file   Years of education: Not on file   Highest education level: Not on file  Occupational History   Occupation: retired     Comment: custodian   Tobacco Use   Smoking status: Some Days    Types: Cigarettes   Smokeless tobacco: Never   Tobacco comments:    4 cigarettes yesterday   Substance and Sexual Activity   Alcohol use: Yes    Comment: hx of ETOH abuse   Drug use: Yes    Types: Cocaine, Marijuana    Comment: had cocaine Sat. (2 days ago)   Sexual activity: Not on file  Other Topics Concern  Not on file  Social History Narrative   Lives with S.O. [name]    Social Determinants of Health   Financial Resource Strain: Not on file  Food Insecurity: Not on file  Transportation Needs: Not on file  Physical Activity: Not on file  Stress: Not on file  Social Connections: Not on file  Intimate Partner Violence: Not on file    FAMILY HISTORY: Family History  Problem Relation Age of Onset   Diabetes Mother    Hypertension Mother    Heart failure Mother    Hyperlipidemia Mother     Thyroid disease Mother    Alcohol abuse Father    Emphysema Father    Kidney failure Father    Gout Father    Cancer Sister    Hyperlipidemia Sister    Hypertension Sister    Rheum arthritis Sister    Fibromyalgia Sister    Thyroid disease Sister     ALLERGIES:  has No Known Allergies.  MEDICATIONS:  Current Outpatient Medications  Medication Sig Dispense Refill   amLODipine (NORVASC) 10 MG tablet Take 0.5 tablets by mouth daily.     aspirin 81 MG EC tablet Take 1 tablet by mouth daily. With a meal     Brinzolamide-Brimonidine 1-0.2 % SUSP Place 1 drop into both eyes in the morning, at noon, and at bedtime.     Cholecalciferol 25 MCG (1000 UT) tablet Take 2 tablets by mouth daily.     dasatinib (SPRYCEL) 100 MG tablet TAKE 1 TABLET (100 MG TOTAL) BY MOUTH DAILY. 30 tablet 3   fluticasone (FLONASE) 50 MCG/ACT nasal spray INSTILL 1 SPRAY INTO EACH NOSTRIL EVERY DAY AS NEEDED MAXIMUM 2 SPRAYS IN EACH NOSTRIL DAILY. FOR ALLERGIES     guaiFENesin (MUCINEX) 600 MG 12 hr tablet TAKE ONE TABLET BY MOUTH TWO TIMES A DAY AS NEEDED FOR SINUS CONGESTION     insulin aspart (NOVOLOG) 100 UNIT/ML injection Inject 100 Units into the skin continuous. INJECT 100 UNITS UNDER SKIN CONTINUOUS-VIA-PUMP AS DIRECTED FOR DIABETES.DISCARD BOTTLE 28 DAYS AFTER OPENING     insulin glargine (LANTUS) 100 UNIT/ML injection      latanoprost (XALATAN) 0.005 % ophthalmic solution Place 1 drop into both eyes at bedtime.     lipase/protease/amylase (CREON) 36000 UNITS CPEP capsule See admin instructions. TAKE 2 CAPSULES BY MOUTH WITH ACTIVE MEALS AND TAKE 1 CAPSULE WITH SNACK     lisinopril-hydrochlorothiazide (ZESTORETIC) 20-12.5 MG tablet Take 1 tablet by mouth daily.     methocarbamol (ROBAXIN) 500 MG tablet Take 1 tablet (500 mg total) by mouth every 8 (eight) hours as needed for muscle spasms. 90 tablet 1   nicotine (NICODERM CQ - DOSED IN MG/24 HOURS) 14 mg/24hr patch Place 14 mg onto the skin daily. APPLY ONE  PATCH TO SKIN EVERY DAY ACTIVE APPLY ONE PATCH WHEN YOU WAKE UP AND REMOVE THE OLD PATCH. PEEL THE BACK OFF THE PATCH AND PUT IT ON CLEAN,DRY, HAIR-FREE SKIN ON THE UPPER ARM, CHEST OR BACK     nicotine polacrilex (COMMIT) 4 MG lozenge Take 4 mg by mouth as needed. DISSOLVE 1 LOZENGE BY MOUTH AS DIRECTED - USE AT LEAST 8-10 LOZENGES EACH DAY TO START. DO NOT USE MORE THAN 20 LOZENGES     pregabalin (LYRICA) 50 MG capsule Take 1 capsule (50 mg total) by mouth 3 (three) times daily. 90 capsule 1   rosuvastatin (CRESTOR) 20 MG tablet Take 10 mg by mouth daily.     sodium chloride (OCEAN)  0.65 % nasal spray SPRAY 2 SPRAYS INTO EACH NOSTRIL FOUR TIMES A DAY FOR SINUS CONGESTION     timolol (TIMOPTIC) 0.5 % ophthalmic solution Place 1 drop into both eyes 2 (two) times daily.     gabapentin (NEURONTIN) 300 MG capsule Take 300 mg by mouth. Take 2 capsules by mouth in the morning, at noon, and at bedtime. (Patient not taking: Reported on 10/08/2021)     nicotine (NICODERM CQ - DOSED IN MG/24 HOURS) 21 mg/24hr patch Place onto the skin. (Patient not taking: Reported on 10/08/2021)     No current facility-administered medications for this visit.      Marland Kitchen  PHYSICAL EXAMINATION: ECOG PERFORMANCE STATUS: 0 - Asymptomatic  Vitals:   10/08/21 1455  BP: 116/67  Pulse: 86  Resp: 16  Temp: 98.8 F (37.1 C)   Filed Weights   10/08/21 1455  Weight: 186 lb (84.4 kg)    Physical Exam Constitutional:      Comments: Patient ambulating independently.  He is accompanied by wife/significant other.  HENT:     Head: Normocephalic and atraumatic.     Mouth/Throat:     Pharynx: No oropharyngeal exudate.  Eyes:     Pupils: Pupils are equal, round, and reactive to light.  Cardiovascular:     Rate and Rhythm: Normal rate and regular rhythm.  Pulmonary:     Effort: No respiratory distress.     Breath sounds: No wheezing.     Comments: Decreased air entry bilaterally. Abdominal:     General: Bowel sounds  are normal. There is no distension.     Palpations: Abdomen is soft. There is no mass.     Tenderness: There is no abdominal tenderness. There is no guarding or rebound.  Musculoskeletal:        General: No tenderness. Normal range of motion.     Cervical back: Normal range of motion and neck supple.  Skin:    General: Skin is warm.  Neurological:     Mental Status: He is alert and oriented to person, place, and time.  Psychiatric:        Mood and Affect: Affect normal.     LABORATORY DATA:  I have reviewed the data as listed Lab Results  Component Value Date   WBC 7.8 10/08/2021   HGB 11.7 (L) 10/08/2021   HCT 34.9 (L) 10/08/2021   MCV 94.1 10/08/2021   PLT 188 10/08/2021   Recent Labs    07/16/21 1434 08/27/21 1438 10/08/21 1427  NA 131* 133* 136  K 4.0 4.2 3.9  CL 104 106 103  CO2 20* 19* 25  GLUCOSE 196* 162* 170*  BUN 29* 31* 23  CREATININE 1.48* 1.66* 1.39*  CALCIUM 8.9 9.0 9.0  GFRNONAA 54* 47* 58*  PROT 7.8 8.4* 8.2*  ALBUMIN 4.1 4.4 4.4  AST _0 ALT _1 ALKPHOS 68 65 70  BILITOT 0.4 0.3 0.2*    RADIOGRAPHIC STUDIES: I have personally reviewed the radiological images as listed and agreed with the findings in the report. No results found.  ASSESSMENT & PLAN:   Chronic myeloid leukemia ([place]) #Chronic myeloid leukemia- on dasatinib 100 mg/day [1st week of April, 2022].  Tolerating well.  #Today white count 4.3 hemoglobin 11.9-; platelets normal.  Continue current dose of Dasatinib.  July 2022-FISH BCR ABL-negative for 9:22 abnormality [optimal; reached 67-monthmilestone. OCT 2022 BCR ABL PCR-positive-[goal: MMR at 12 months].  Await BCR ABL testing from today.  #Mild  anemia/CKD--hemoglobin 11 saturation 10%; recommend dietary [liver]-STABLE  #Substance drug abuse-March 2022-positive for cocaine [[facility]]-states to quit illicit drugs-STABLE  #Reflux-recommend holding off PPIs-given the interaction with dasatinib.  Continue Tums as  needed.  #Diabetes-poorly controlled; currently on insulin pump- BG- 170 STABLE  # DISPOSITION: # follow up in 2 months-MD; labs- cbc/cmp/magnesium; Bcr-ABL RT-PCR-- Dr.B  All questions were answered. The patient knows to call the clinic with any problems, questions or concerns.     Cammie Sickle, MD 10/08/2021 3:30 PM

## 2021-10-08 NOTE — Progress Notes (Signed)
Patient denies new problems/concerns today.   °

## 2021-10-08 NOTE — Assessment & Plan Note (Addendum)
#  Chronic myeloid leukemia- on dasatinib 100 mg/day [1st week of April, 2022].  Tolerating well.  #Today white count 4.3 hemoglobin 11.9-; platelets normal.  Continue current dose of Dasatinib.  July 2022-FISH BCR ABL-negative for 9:22 abnormality [optimal; reached 78-monthmilestone. OCT 2022 BCR ABL PCR-positive-[goal: MMR at 12 months].  Await BCR ABL testing from today.  #Mild anemia/CKD--hemoglobin 11 saturation 10%; recommend dietary [liver]-STABLE  #Substance drug abuse-March 2022-positive for cocaine [[facility]]-states to quit illicit drugs-STABLE  #Reflux-recommend holding off PPIs-given the interaction with dasatinib.  Continue Tums as needed.  #Diabetes-poorly controlled; currently on insulin pump- BG- 170 STABLE  # DISPOSITION: # follow up in 2 months-MD; labs- cbc/cmp/magnesium; Bcr-ABL RT-PCR-- Dr.B

## 2021-10-15 ENCOUNTER — Other Ambulatory Visit (HOSPITAL_COMMUNITY): Payer: Self-pay

## 2021-10-17 ENCOUNTER — Other Ambulatory Visit: Payer: Self-pay | Admitting: Internal Medicine

## 2021-10-17 ENCOUNTER — Other Ambulatory Visit (HOSPITAL_COMMUNITY): Payer: Self-pay

## 2021-10-19 ENCOUNTER — Other Ambulatory Visit: Payer: Self-pay | Admitting: Internal Medicine

## 2021-10-19 ENCOUNTER — Other Ambulatory Visit (HOSPITAL_COMMUNITY): Payer: Self-pay

## 2021-10-22 ENCOUNTER — Other Ambulatory Visit (HOSPITAL_COMMUNITY): Payer: Self-pay

## 2021-10-22 ENCOUNTER — Other Ambulatory Visit: Payer: Self-pay | Admitting: Internal Medicine

## 2021-10-22 DIAGNOSIS — C9210 Chronic myeloid leukemia, BCR/ABL-positive, not having achieved remission: Secondary | ICD-10-CM

## 2021-10-22 MED ORDER — DASATINIB 100 MG PO TABS
100.0000 mg | ORAL_TABLET | Freq: Every day | ORAL | 3 refills | Status: DC
  Filled 2021-10-22: qty 30, 30d supply, fill #0
  Filled 2021-11-16: qty 30, 30d supply, fill #1
  Filled 2021-12-13: qty 30, 30d supply, fill #2
  Filled 2022-01-14: qty 30, 30d supply, fill #3

## 2021-10-23 ENCOUNTER — Other Ambulatory Visit (HOSPITAL_COMMUNITY): Payer: Self-pay

## 2021-11-12 ENCOUNTER — Other Ambulatory Visit (HOSPITAL_COMMUNITY): Payer: Self-pay

## 2021-11-14 ENCOUNTER — Other Ambulatory Visit (HOSPITAL_COMMUNITY): Payer: Self-pay

## 2021-11-16 ENCOUNTER — Other Ambulatory Visit (HOSPITAL_COMMUNITY): Payer: Self-pay

## 2021-11-21 ENCOUNTER — Other Ambulatory Visit (HOSPITAL_COMMUNITY): Payer: Self-pay

## 2021-11-28 ENCOUNTER — Other Ambulatory Visit (HOSPITAL_COMMUNITY): Payer: Self-pay

## 2021-12-05 ENCOUNTER — Other Ambulatory Visit: Payer: Self-pay | Admitting: *Deleted

## 2021-12-05 DIAGNOSIS — C9210 Chronic myeloid leukemia, BCR/ABL-positive, not having achieved remission: Secondary | ICD-10-CM

## 2021-12-10 ENCOUNTER — Encounter: Payer: Self-pay | Admitting: Internal Medicine

## 2021-12-10 ENCOUNTER — Inpatient Hospital Stay: Payer: Medicare Other | Attending: Internal Medicine

## 2021-12-10 ENCOUNTER — Inpatient Hospital Stay (HOSPITAL_BASED_OUTPATIENT_CLINIC_OR_DEPARTMENT_OTHER): Payer: Medicare Other | Admitting: Internal Medicine

## 2021-12-10 ENCOUNTER — Other Ambulatory Visit: Payer: Self-pay

## 2021-12-10 DIAGNOSIS — Z9641 Presence of insulin pump (external) (internal): Secondary | ICD-10-CM | POA: Insufficient documentation

## 2021-12-10 DIAGNOSIS — Z794 Long term (current) use of insulin: Secondary | ICD-10-CM | POA: Diagnosis not present

## 2021-12-10 DIAGNOSIS — C9210 Chronic myeloid leukemia, BCR/ABL-positive, not having achieved remission: Secondary | ICD-10-CM | POA: Diagnosis not present

## 2021-12-10 DIAGNOSIS — I129 Hypertensive chronic kidney disease with stage 1 through stage 4 chronic kidney disease, or unspecified chronic kidney disease: Secondary | ICD-10-CM | POA: Diagnosis not present

## 2021-12-10 DIAGNOSIS — E1122 Type 2 diabetes mellitus with diabetic chronic kidney disease: Secondary | ICD-10-CM | POA: Diagnosis not present

## 2021-12-10 DIAGNOSIS — N189 Chronic kidney disease, unspecified: Secondary | ICD-10-CM | POA: Diagnosis not present

## 2021-12-10 DIAGNOSIS — D631 Anemia in chronic kidney disease: Secondary | ICD-10-CM | POA: Diagnosis not present

## 2021-12-10 LAB — COMPREHENSIVE METABOLIC PANEL
ALT: 23 U/L (ref 0–44)
AST: 31 U/L (ref 15–41)
Albumin: 4.3 g/dL (ref 3.5–5.0)
Alkaline Phosphatase: 61 U/L (ref 38–126)
Anion gap: 9 (ref 5–15)
BUN: 25 mg/dL — ABNORMAL HIGH (ref 8–23)
CO2: 20 mmol/L — ABNORMAL LOW (ref 22–32)
Calcium: 8.9 mg/dL (ref 8.9–10.3)
Chloride: 104 mmol/L (ref 98–111)
Creatinine, Ser: 1.42 mg/dL — ABNORMAL HIGH (ref 0.61–1.24)
GFR, Estimated: 56 mL/min — ABNORMAL LOW (ref >60)
Glucose, Bld: 229 mg/dL — ABNORMAL HIGH (ref 70–99)
Potassium: 3.9 mmol/L (ref 3.5–5.1)
Sodium: 133 mmol/L — ABNORMAL LOW (ref 135–145)
Total Bilirubin: 0.2 mg/dL — ABNORMAL LOW (ref 0.3–1.2)
Total Protein: 7.6 g/dL (ref 6.5–8.1)

## 2021-12-10 LAB — CBC WITH DIFFERENTIAL/PLATELET
Abs Immature Granulocytes: 0.02 10*3/uL (ref 0.00–0.07)
Basophils Absolute: 0 10*3/uL (ref 0.0–0.1)
Basophils Relative: 1 %
Eosinophils Absolute: 0.4 10*3/uL (ref 0.0–0.5)
Eosinophils Relative: 5 %
HCT: 38.1 % — ABNORMAL LOW (ref 39.0–52.0)
Hemoglobin: 12.9 g/dL — ABNORMAL LOW (ref 13.0–17.0)
Immature Granulocytes: 0 %
Lymphocytes Relative: 25 %
Lymphs Abs: 1.7 10*3/uL (ref 0.7–4.0)
MCH: 30.9 pg (ref 26.0–34.0)
MCHC: 33.9 g/dL (ref 30.0–36.0)
MCV: 91.4 fL (ref 80.0–100.0)
Monocytes Absolute: 0.6 10*3/uL (ref 0.1–1.0)
Monocytes Relative: 8 %
Neutro Abs: 4.2 10*3/uL (ref 1.7–7.7)
Neutrophils Relative %: 61 %
Platelets: 217 10*3/uL (ref 150–400)
RBC: 4.17 MIL/uL — ABNORMAL LOW (ref 4.22–5.81)
RDW: 13.7 % (ref 11.5–15.5)
WBC: 6.9 10*3/uL (ref 4.0–10.5)
nRBC: 0 % (ref 0.0–0.2)

## 2021-12-10 LAB — MAGNESIUM: Magnesium: 1.9 mg/dL (ref 1.7–2.4)

## 2021-12-10 NOTE — Progress Notes (Signed)
[facility] NOTE  Patient Care Team: Administration, Veterans as PCP - General  CHIEF COMPLAINTS/PURPOSE OF CONSULTATION: CML  #  Oncology History Overview Note  Result Comment:   Comment: (NOTE)  ABNORMAL: 89% OF NUCLEI POSITIVE FOR BCR/ABL1 GENE FUSION  SIGNAL(S)     # MARCH 2022--  Hypercellular bone marrow with involvement by chronic myeloid  leukemia, BCR-ABL1-positive . he bone marrow is hypercellular for age with myeloid and megakaryocytic  hyperplasia.  There is no increase in blasts by manual differential count of aspirate smears (<1%) or by CD34 immunohistochemistry on the core biopsy.  A reticulin special stain reveals no significant increase  in reticulin fibrosis (MF grade 0 of 3).  # April 1st week -DASATINIB 100 mg    #hypertension /alcoholic liver/ type 2 diabetes on insulin poorly controlled chronic back pain    Chronic myeloid leukemia ([place])  01/31/2021 Initial Diagnosis   Chronic myeloid leukemia (HCC)      HISTORY OF PRESENTING ILLNESS: Ambulating independently.  Accompanied by his wife.  Frank [name] 61 y.o.  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  Patient admits to mild diarrhea.  Denies any multiple loose stools.  Complains of 1 loose stool after meals.  Chronic.  Denies getting any worse after starting the dasatinib.  Patient has lost weight.  Concerned about weight loss.  Admits to poor appetite.  No fever no chills.  No abdominal pain.  Admits to taking his pills on time/daily.   Review of Systems  Constitutional:  Positive for malaise/fatigue. Negative for chills, diaphoresis, fever and weight loss.  HENT:  Negative for nosebleeds and sore throat.   Eyes:  Negative for double vision.  Respiratory:  Negative for cough, hemoptysis, sputum production, shortness of breath and wheezing.   Cardiovascular:  Negative for chest pain, palpitations, orthopnea and leg swelling.   Gastrointestinal:  Negative for abdominal pain, blood in stool, constipation, diarrhea, heartburn, melena, nausea and vomiting.  Genitourinary:  Negative for dysuria, frequency and urgency.  Musculoskeletal:  Positive for back pain and joint pain.  Skin: Negative.  Negative for itching and rash.  Neurological:  Negative for dizziness, tingling, focal weakness, weakness and headaches.  Endo/Heme/Allergies:  Does not bruise/bleed easily.  Psychiatric/Behavioral:  Negative for depression. The patient is not nervous/anxious and does not have insomnia.     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 No pertinent surgical history.  SOCIAL HISTORY: Social History   Socioeconomic History   Marital status: Single    Spouse name: Not on file   Number of children: Not on file   Years of education: Not on file   Highest education level: Not on file  Occupational History   Occupation: retired     Comment: custodian   Tobacco Use   Smoking status: Some Days    Types: Cigarettes   Smokeless tobacco: Never   Tobacco comments:    4 cigarettes yesterday   Vaping Use   Vaping Use: Never used  Substance and Sexual  Activity   Alcohol use: Yes    Comment: hx of ETOH abuse   Drug use: Yes    Types: Cocaine, Marijuana    Comment: had cocaine Sat. (2 days ago)   Sexual activity: Not on file  Other Topics Concern   Not on file  Social History Narrative   Lives with S.O. [name]    Social Determinants of Health   Financial Resource Strain: Not on file  Food Insecurity: Not on file  Transportation Needs: Not on file  Physical Activity: Not on  file  Stress: Not on file  Social Connections: Not on file  Intimate Partner Violence: Not on file    FAMILY HISTORY: Family History  Problem Relation Age of Onset   Diabetes Mother    Hypertension Mother    Heart failure Mother    Hyperlipidemia Mother    Thyroid disease Mother    Alcohol abuse Father    Emphysema Father    Kidney failure Father    Gout Father    Cancer Sister    Hyperlipidemia Sister    Hypertension Sister    Rheum arthritis Sister    Fibromyalgia Sister    Thyroid disease Sister     ALLERGIES:  has No Known Allergies.  MEDICATIONS:  Current Outpatient Medications  Medication Sig Dispense Refill   amLODipine (NORVASC) 10 MG tablet Take 0.5 tablets by mouth daily.     aspirin 81 MG EC tablet Take 1 tablet by mouth daily. With a meal     Brinzolamide-Brimonidine 1-0.2 % SUSP Place 1 drop into both eyes in the morning, at noon, and at bedtime.     Cholecalciferol 25 MCG (1000 UT) tablet Take 2 tablets by mouth daily.     dasatinib (SPRYCEL) 100 MG tablet Take 1 tablet (100 mg total) by mouth daily. 30 tablet 3   fluticasone (FLONASE) 50 MCG/ACT nasal spray INSTILL 1 SPRAY INTO EACH NOSTRIL EVERY DAY AS NEEDED MAXIMUM 2 SPRAYS IN EACH NOSTRIL DAILY. FOR ALLERGIES     guaiFENesin (MUCINEX) 600 MG 12 hr tablet TAKE ONE TABLET BY MOUTH TWO TIMES A DAY AS NEEDED FOR SINUS CONGESTION     insulin aspart (NOVOLOG) 100 UNIT/ML injection Inject 100 Units into the skin continuous. INJECT 100 UNITS UNDER SKIN CONTINUOUS-VIA-PUMP AS DIRECTED FOR DIABETES.DISCARD BOTTLE 28 DAYS AFTER OPENING     insulin glargine (LANTUS) 100 UNIT/ML injection      latanoprost (XALATAN) 0.005 % ophthalmic solution Place 1 drop into both eyes at bedtime.     lipase/protease/amylase (CREON) 36000 UNITS CPEP capsule See admin instructions. TAKE 2 CAPSULES BY MOUTH WITH ACTIVE MEALS AND TAKE 1 CAPSULE WITH SNACK     lisinopril-hydrochlorothiazide (ZESTORETIC) 20-12.5 MG tablet Take 1 tablet by  mouth daily.     nicotine (NICODERM CQ - DOSED IN MG/24 HOURS) 14 mg/24hr patch Place 14 mg onto the skin daily. APPLY ONE PATCH TO SKIN EVERY DAY ACTIVE APPLY ONE PATCH WHEN YOU WAKE UP AND REMOVE THE OLD PATCH. PEEL THE BACK OFF THE PATCH AND PUT IT ON CLEAN,DRY, HAIR-FREE SKIN ON THE UPPER ARM, CHEST OR BACK     nicotine polacrilex (COMMIT) 4 MG lozenge Take 4 mg by mouth as needed. DISSOLVE 1 LOZENGE BY MOUTH AS DIRECTED - USE AT LEAST 8-10 LOZENGES EACH DAY TO START. DO NOT USE MORE THAN 20 LOZENGES     pregabalin (LYRICA) 50 MG capsule Take 1 capsule (50 mg total) by mouth 3 (three) times daily. 90 capsule  1   rosuvastatin (CRESTOR) 20 MG tablet Take 10 mg by mouth daily.     sodium chloride (OCEAN) 0.65 % nasal spray SPRAY 2 SPRAYS INTO EACH NOSTRIL FOUR TIMES A DAY FOR SINUS CONGESTION     timolol (TIMOPTIC) 0.5 % ophthalmic solution Place 1 drop into both eyes 2 (two) times daily.     methocarbamol (ROBAXIN) 500 MG tablet Take 1 tablet (500 mg total) by mouth every 8 (eight) hours as needed for muscle spasms. (Patient not taking: Reported on 12/10/2021) 90 tablet 1   nicotine (NICODERM CQ - DOSED IN MG/24 HOURS) 21 mg/24hr patch Place onto the skin. (Patient not taking: Reported on 10/08/2021)     No current facility-administered medications for this visit.      Marland Kitchen  PHYSICAL EXAMINATION: ECOG PERFORMANCE STATUS: 0 - Asymptomatic  Vitals:   12/10/21 1435  BP: 118/73  Pulse: 82  Temp: 99.5 F (37.5 C)  SpO2: 100%   Filed Weights   12/10/21 1435  Weight: 176 lb (79.8 kg)    Physical Exam Constitutional:      Comments: Patient ambulating independently.  He is accompanied by wife/significant other.  HENT:     Head: Normocephalic and atraumatic.     Mouth/Throat:     Pharynx: No oropharyngeal exudate.  Eyes:     Pupils: Pupils are equal, round, and reactive to light.  Cardiovascular:     Rate and Rhythm: Normal rate and regular rhythm.  Pulmonary:     Effort: No  respiratory distress.     Breath sounds: No wheezing.     Comments: Decreased air entry bilaterally. Abdominal:     General: Bowel sounds are normal. There is no distension.     Palpations: Abdomen is soft. There is no mass.     Tenderness: There is no abdominal tenderness. There is no guarding or rebound.  Musculoskeletal:        General: No tenderness. Normal range of motion.     Cervical back: Normal range of motion and neck supple.  Skin:    General: Skin is warm.  Neurological:     Mental Status: He is alert and oriented to person, place, and time.  Psychiatric:        Mood and Affect: Affect normal.     LABORATORY DATA:  I have reviewed the data as listed Lab Results  Component Value Date   WBC 6.9 12/10/2021   HGB 12.9 (L) 12/10/2021   HCT 38.1 (L) 12/10/2021   MCV 91.4 12/10/2021   PLT 217 12/10/2021   Recent Labs    08/27/21 1438 10/08/21 1427 12/10/21 1419  NA 133* 136 133*  K 4.2 3.9 3.9  CL 106 103 104  CO2 19* 25 20*  GLUCOSE 162* 170* 229*  BUN 31* 23 25*  CREATININE 1.66* 1.39* 1.42*  CALCIUM 9.0 9.0 8.9  GFRNONAA 47* 58* 56*  PROT 8.4* 8.2* 7.6  ALBUMIN 4.4 4.4 4.3  AST $Re'28 27 31  'ZfA$ ALT $R'20 21 23  'Li$ ALKPHOS 65 70 61  BILITOT 0.3 0.2* 0.2*    RADIOGRAPHIC STUDIES: I have personally reviewed the radiological images as listed and agreed with the findings in the report. No results found.  ASSESSMENT & PLAN:   Chronic myeloid leukemia ([place]) #Chronic myeloid leukemia- on dasatinib 100 mg/day [1st week of April, 2022].  Tolerating well.  #Today white count 4.3 hemoglobin 12.9; platelets normal.  Continue current dose of Dasatinib.  July 2022-FISH BCR ABL-negative for 9:22 abnormality [optimal;  reached 43-month milestone. OCT 2022 BCR ABL PCR-positive-[goal: MMR at 12 months].  Await BCR ABL testing from today.  #Mild anemia/CKD--hemoglobin 12.9 saturation 10%; continued dietary [liver]-- STABLE.   #Substance drug abuse-March 2022-positive for cocaine  [[facility]]-states to quit illicit drugs-STABLE  #Weight loss-patient admits to poor appetite; mild to moderate diarrhea [patient denies it is from Dasatinib]; hold off steroids given poorly controlled blood sugars.  Recommend evaluation with [facility].   #Reflux-recommend holding off PPIs-given the interaction with dasatinib.  Continue Tums as needed.  #Diabetes-poorly controlled; currently on insulin pump- BG- 229; STABLE.   # DISPOSITION: # referral to [facility] re: weight loss # follow up in 2 months-MD; labs- cbc/cmp/magnesium; Bcr-ABL RT-PCR; -- Dr.B   All questions were answered. The patient knows to call the clinic with any problems, questions or concerns.     Cammie Sickle, MD 12/10/2021 3:42 PM

## 2021-12-10 NOTE — Assessment & Plan Note (Addendum)
#  Chronic myeloid leukemia- on dasatinib 100 mg/day [1st week of April, 2022].  Tolerating well.  #Today white count 4.3 hemoglobin 12.9; platelets normal.  Continue current dose of Dasatinib.  July 2022-FISH BCR ABL-negative for 9:22 abnormality [optimal; reached 56-month milestone. OCT 2022 BCR ABL PCR-positive-[goal: MMR at 12 months].  Await BCR ABL testing from today.  #Mild anemia/CKD--hemoglobin 12.9 saturation 10%; continued dietary [liver]-- STABLE.   #Substance drug abuse-March 2022-positive for cocaine [[facility]]-states to quit illicit drugs-STABLE  #Weight loss-patient admits to poor appetite; mild to moderate diarrhea [patient denies it is from Dasatinib]; hold off steroids given poorly controlled blood sugars.  Recommend evaluation with [facility].   #Reflux-recommend holding off PPIs-given the interaction with dasatinib.  Continue Tums as needed.  #Diabetes-poorly controlled; currently on insulin pump- BG- 229; STABLE.   # DISPOSITION: # referral to [facility] re: weight loss # follow up in 2 months-MD; labs- cbc/cmp/magnesium; Bcr-ABL RT-PCR; -- Dr.B

## 2021-12-12 ENCOUNTER — Telehealth: Payer: Self-pay | Admitting: Internal Medicine

## 2021-12-12 NOTE — Telephone Encounter (Signed)
Pt called to cancel his appt for 1-20. Call back at [phone]

## 2021-12-13 ENCOUNTER — Telehealth: Payer: Self-pay

## 2021-12-13 ENCOUNTER — Other Ambulatory Visit (HOSPITAL_COMMUNITY): Payer: Self-pay

## 2021-12-13 NOTE — Telephone Encounter (Signed)
Nutrition  Received message that patient wanted to cancel nutrition appointment for 1/20 and to call back.    RD called patient and offered to reschedule nutrition appointment but patient declined rescheduling at this time.  RD available if needed in the future.   Frank [name], Frank [name], Frank [name] Registered Dietitian [phone] (mobile)

## 2021-12-14 ENCOUNTER — Inpatient Hospital Stay: Payer: Medicare Other

## 2021-12-18 LAB — BCR-ABL1, CML/ALL, PCR, QUANT: Interpretation (BCRAL):: NEGATIVE

## 2021-12-19 ENCOUNTER — Other Ambulatory Visit (HOSPITAL_COMMUNITY): Payer: Self-pay

## 2021-12-28 ENCOUNTER — Other Ambulatory Visit: Payer: Self-pay | Admitting: Student in an Organized Health Care Education/Training Program

## 2021-12-28 DIAGNOSIS — M5136 Other intervertebral disc degeneration, lumbar region: Secondary | ICD-10-CM

## 2021-12-28 DIAGNOSIS — G894 Chronic pain syndrome: Secondary | ICD-10-CM

## 2021-12-31 ENCOUNTER — Other Ambulatory Visit (HOSPITAL_COMMUNITY): Payer: Self-pay

## 2022-01-11 ENCOUNTER — Other Ambulatory Visit (HOSPITAL_COMMUNITY): Payer: Self-pay

## 2022-01-14 ENCOUNTER — Other Ambulatory Visit (HOSPITAL_COMMUNITY): Payer: Self-pay

## 2022-01-16 ENCOUNTER — Other Ambulatory Visit (HOSPITAL_COMMUNITY): Payer: Self-pay

## 2022-02-07 ENCOUNTER — Inpatient Hospital Stay (HOSPITAL_BASED_OUTPATIENT_CLINIC_OR_DEPARTMENT_OTHER): Payer: Medicare Other | Admitting: Internal Medicine

## 2022-02-07 ENCOUNTER — Inpatient Hospital Stay: Payer: Medicare Other | Attending: Internal Medicine

## 2022-02-07 ENCOUNTER — Other Ambulatory Visit: Payer: Self-pay

## 2022-02-07 DIAGNOSIS — I129 Hypertensive chronic kidney disease with stage 1 through stage 4 chronic kidney disease, or unspecified chronic kidney disease: Secondary | ICD-10-CM | POA: Insufficient documentation

## 2022-02-07 DIAGNOSIS — N189 Chronic kidney disease, unspecified: Secondary | ICD-10-CM | POA: Insufficient documentation

## 2022-02-07 DIAGNOSIS — C9210 Chronic myeloid leukemia, BCR/ABL-positive, not having achieved remission: Secondary | ICD-10-CM | POA: Insufficient documentation

## 2022-02-07 DIAGNOSIS — E1122 Type 2 diabetes mellitus with diabetic chronic kidney disease: Secondary | ICD-10-CM | POA: Insufficient documentation

## 2022-02-07 LAB — CBC WITH DIFFERENTIAL/PLATELET
Abs Immature Granulocytes: 0.02 10*3/uL (ref 0.00–0.07)
Basophils Absolute: 0 10*3/uL (ref 0.0–0.1)
Basophils Relative: 0 %
Eosinophils Absolute: 0.4 10*3/uL (ref 0.0–0.5)
Eosinophils Relative: 6 %
HCT: 42.6 % (ref 39.0–52.0)
Hemoglobin: 14.1 g/dL (ref 13.0–17.0)
Immature Granulocytes: 0 %
Lymphocytes Relative: 24 %
Lymphs Abs: 1.7 10*3/uL (ref 0.7–4.0)
MCH: 30.7 pg (ref 26.0–34.0)
MCHC: 33.1 g/dL (ref 30.0–36.0)
MCV: 92.6 fL (ref 80.0–100.0)
Monocytes Absolute: 0.5 10*3/uL (ref 0.1–1.0)
Monocytes Relative: 7 %
Neutro Abs: 4.3 10*3/uL (ref 1.7–7.7)
Neutrophils Relative %: 63 %
Platelets: 208 10*3/uL (ref 150–400)
RBC: 4.6 MIL/uL (ref 4.22–5.81)
RDW: 14.8 % (ref 11.5–15.5)
WBC: 6.9 10*3/uL (ref 4.0–10.5)
nRBC: 0 % (ref 0.0–0.2)

## 2022-02-07 LAB — COMPREHENSIVE METABOLIC PANEL
ALT: 24 U/L (ref 0–44)
AST: 35 U/L (ref 15–41)
Albumin: 4.1 g/dL (ref 3.5–5.0)
Alkaline Phosphatase: 61 U/L (ref 38–126)
Anion gap: 8 (ref 5–15)
BUN: 14 mg/dL (ref 8–23)
CO2: 23 mmol/L (ref 22–32)
Calcium: 9.1 mg/dL (ref 8.9–10.3)
Chloride: 104 mmol/L (ref 98–111)
Creatinine, Ser: 1.22 mg/dL (ref 0.61–1.24)
GFR, Estimated: >60 mL/min (ref >60)
Glucose, Bld: 185 mg/dL — ABNORMAL HIGH (ref 70–99)
Potassium: 3.8 mmol/L (ref 3.5–5.1)
Sodium: 135 mmol/L (ref 135–145)
Total Bilirubin: 0.3 mg/dL (ref 0.3–1.2)
Total Protein: 7.6 g/dL (ref 6.5–8.1)

## 2022-02-07 LAB — MAGNESIUM: Magnesium: 2 mg/dL (ref 1.7–2.4)

## 2022-02-07 NOTE — Progress Notes (Signed)
[place] ?CONSULT NOTE ? ?Patient Care Team: ?Administration, Veterans as PCP - General ? ?CHIEF COMPLAINTS/PURPOSE OF CONSULTATION: CML ? ?#  ?Oncology History Overview Note  ?Result Comment:   ?Comment: (NOTE)  ?ABNORMAL: 89% OF NUCLEI POSITIVE FOR BCR/ABL1 GENE FUSION  ?SIGNAL(S)   ? ? ?# MARCH 2022--  Hypercellular bone marrow with involvement by chronic myeloid  leukemia, BCR-ABL1-positive . he bone marrow is hypercellular for age with myeloid and megakaryocytic  hyperplasia.  There is no increase in blasts by manual differential count of aspirate smears (<1%) or by CD34 immunohistochemistry on the core biopsy.  A reticulin special stain reveals no significant increase  ?in reticulin fibrosis (MF grade 0 of 3). ? ?# April 1st week -DASATINIB 100 mg; July 2022-FISH BCR ABL-negative for 9:22 abnormality [optimal; reached 4-monthmilestone. MMR at 10 months [gaol at 132]? ? #hypertension /alcoholic liver/ type 2 diabetes on insulin poorly controlled chronic back pain ? ?  ?Chronic myeloid leukemia ([place]  ?01/31/2021 Initial Diagnosis  ? Chronic myeloid leukemia ([place] ?  ? ? ? ?HISTORY OF PRESENTING ILLNESS: Ambulating independently.  Accompanied by his wife. ? ?Frank Class89y.o.  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 ? ?Patient has new abdominal pain/discomfort in the mornings that gets better after eating.  However has chronic pain. ? ?Admits to mild diarrhea or loose stool after meals.  Otherwise tolerating Dasatinib well.  ? ?He has gained little weight.  No fever no chills.  No abdominal pain.  Admits to taking his pills on time/daily. ? ? ?Review of Systems  ?Constitutional:  Positive for malaise/fatigue. Negative for chills, diaphoresis, fever and weight loss.  ?HENT:  Negative for nosebleeds and sore throat.   ?Eyes:  Negative for double vision.  ?Respiratory:  Negative for cough, hemoptysis, sputum production, shortness of breath and  wheezing.   ?Cardiovascular:  Negative for chest pain, palpitations, orthopnea and leg swelling.  ?Gastrointestinal:  Negative for abdominal pain, blood in stool, constipation, diarrhea, heartburn, melena, nausea and vomiting.  ?Genitourinary:  Negative for dysuria, frequency and urgency.  ?Musculoskeletal:  Positive for back pain and joint pain.  ?Skin: Negative.  Negative for itching and rash.  ?Neurological:  Negative for dizziness, tingling, focal weakness, weakness and headaches.  ?Endo/Heme/Allergies:  Does not bruise/bleed easily.  ?Psychiatric/Behavioral:  Negative for depression. The patient is not nervous/anxious and does not have insomnia.    ? ?MEDICAL HISTORY:  ?Past Medical History:  ?Diagnosis Date  ? Arthritis   ? Chronic low back pain with sciatica   ? CKD (chronic kidney disease)   ? unknown stage  ? Diabetes mellitus without complication ([place]   ? insulin pump therapy  ? Hemorrhoids   ? History of pancreatitis   ? Hyperlipidemia   ? Hypertension   ? 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 ? ?SURGICAL HISTORY: ?No past surgical history on file. ? ?SOCIAL HISTORY: ?Social History  ? ?Socioeconomic History  ? Marital status: Single  ?  Spouse name: Not on file  ? Number of children: Not on file  ? Years of education: Not on file  ? Highest education level: Not on file  ?Occupational History  ? 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 Smoking status: Some Days  ?  Types: Cigarettes  ? Smokeless tobacco: Never  ? Tobacco comments:  ?  4 cigarettes yesterday   ?  Vaping Use  ? Vaping Use: Never used  ?Substance and Sexual Activity  ? Alcohol use: Yes  ?  Comment: hx of ETOH abuse  ? Drug use: Yes  ?  Types: Cocaine, Marijuana  ?  Comment: had cocaine Sat. (2 days ago)  ? Sexual activity: Not on file  ?Other Topics Concern  ? Not on file  ?Social History Narrative  ? Lives with S.O. [name]   ? ?Social Determinants of Health  ? ?Financial Resource Strain: Not on file  ?Food  Insecurity: Not on file  ?Transportation Needs: Not on file  ?Physical Activity: Not on file  ?Stress: Not on file  ?Social Connections: Not on file  ?Intimate Partner Violence: Not on file  ? ? ?FAMILY HISTORY: ?Family History  ?Problem Relation Age of Onset  ? Diabetes Mother   ? Hypertension Mother   ? Heart failure Mother   ? Hyperlipidemia Mother   ? Thyroid disease Mother   ? Alcohol abuse Father   ? Emphysema Father   ? Kidney failure Father   ? Gout Father   ? Cancer Sister   ? Hyperlipidemia Sister   ? Hypertension Sister   ? Rheum arthritis Sister   ? Fibromyalgia Sister   ? Thyroid disease Sister   ? ? ?ALLERGIES:  has No Known Allergies. ? ?MEDICATIONS:  ?Current Outpatient Medications  ?Medication Sig Dispense Refill  ? amLODipine (NORVASC) 10 MG tablet Take 0.5 tablets by mouth daily.    ? aspirin 81 MG EC tablet Take 1 tablet by mouth daily. With a meal    ? Brinzolamide-Brimonidine 1-0.2 % SUSP Place 1 drop into both eyes in the morning, at noon, and at bedtime.    ? Cholecalciferol 25 MCG (1000 UT) tablet Take 2 tablets by mouth daily.    ? dasatinib (SPRYCEL) 100 MG tablet Take 1 tablet (100 mg total) by mouth daily. 30 tablet 3  ? fluticasone (FLONASE) 50 MCG/ACT nasal spray INSTILL 1 SPRAY INTO EACH NOSTRIL EVERY DAY AS NEEDED MAXIMUM 2 SPRAYS IN EACH NOSTRIL DAILY. FOR ALLERGIES    ? guaiFENesin (MUCINEX) 600 MG 12 hr tablet TAKE ONE TABLET BY MOUTH TWO TIMES A DAY AS NEEDED FOR SINUS CONGESTION    ? insulin aspart (NOVOLOG) 100 UNIT/ML injection Inject 100 Units into the skin continuous. INJECT 100 UNITS UNDER SKIN CONTINUOUS-VIA-PUMP AS DIRECTED FOR DIABETES.DISCARD BOTTLE 28 DAYS AFTER OPENING    ? insulin glargine (LANTUS) 100 UNIT/ML injection     ? latanoprost (XALATAN) 0.005 % ophthalmic solution Place 1 drop into both eyes at bedtime.    ? lipase/protease/amylase (CREON) 36000 UNITS CPEP capsule See admin instructions. TAKE 2 CAPSULES BY MOUTH WITH ACTIVE ?MEALS AND TAKE 1 CAPSULE WITH  SNACK    ? lisinopril-hydrochlorothiazide (ZESTORETIC) 20-12.5 MG tablet Take 1 tablet by mouth daily.    ? methocarbamol (ROBAXIN) 500 MG tablet Take 1 tablet (500 mg total) by mouth every 8 (eight) hours as needed for muscle spasms. 90 tablet 1  ? nicotine (NICODERM CQ - DOSED IN MG/24 HOURS) 14 mg/24hr patch Place 14 mg onto the skin daily. APPLY ONE PATCH TO SKIN EVERY DAY ACTIVE ?APPLY ONE PATCH WHEN YOU WAKE UP AND REMOVE THE OLD ?PATCH. PEEL THE BACK OFF THE PATCH AND PUT IT ON ?CLEAN,DRY, HAIR-FREE SKIN ON THE UPPER ARM, CHEST OR ?BACK    ? nicotine polacrilex (COMMIT) 4 MG lozenge Take 4 mg by mouth as needed. DISSOLVE 1 LOZENGE BY MOUTH AS DIRECTED -  USE AT LEAST 8-10 LOZENGES EACH DAY TO START. DO NOT USE MORE THAN 20 LOZENGES    ? pregabalin (LYRICA) 50 MG capsule Take 1 capsule (50 mg total) by mouth 3 (three) times daily. 90 capsule 1  ? rosuvastatin (CRESTOR) 20 MG tablet Take 10 mg by mouth daily.    ? sodium chloride (OCEAN) 0.65 % nasal spray SPRAY 2 SPRAYS INTO EACH NOSTRIL FOUR TIMES A DAY FOR SINUS CONGESTION    ? timolol (TIMOPTIC) 0.5 % ophthalmic solution Place 1 drop into both eyes 2 (two) times daily.    ? nicotine (NICODERM CQ - DOSED IN MG/24 HOURS) 21 mg/24hr patch Place onto the skin. (Patient not taking: Reported on 10/08/2021)    ? ?No current facility-administered medications for this visit.  ? ? ?  ?. ? ?PHYSICAL EXAMINATION: ?ECOG PERFORMANCE STATUS: 0 - Asymptomatic ? ?Vitals:  ? 02/07/22 1100  ?BP: 134/79  ?Pulse: 68  ?Temp: 99.8 ?F (37.7 ?C)  ? ?Filed Weights  ? 02/07/22 1100  ?Weight: 177 lb 9.6 oz (80.6 kg)  ? ? ?Physical Exam ?Constitutional:   ?   Comments: Patient ambulating independently.  He is accompanied by wife/significant other.  ?HENT:  ?   Head: Normocephalic and atraumatic.  ?   Mouth/Throat:  ?   Pharynx: No oropharyngeal exudate.  ?Eyes:  ?   Pupils: Pupils are equal, round, and reactive to light.  ?Cardiovascular:  ?   Rate and Rhythm: Normal rate and regular  rhythm.  ?Pulmonary:  ?   Effort: No respiratory distress.  ?   Breath sounds: No wheezing.  ?   Comments: Decreased air entry bilaterally. ?Abdominal:  ?   General: Bowel sounds are normal. There is no distension

## 2022-02-07 NOTE — Assessment & Plan Note (Signed)
#  Chronic myeloid leukemia- on dasatinib 100 mg/day [1st week of April, 2022].  Tolerating well. ? ?# Today white count 4.3 hemoglobin 14; platelets normal.  Continue current dose of Dasatinib. JAN 2023-BCR ABL PCR-positive-[goal: MMR at 12 months].  Await BCR ABL testing from today.  Given the excellent response to Dasatinib recommend monitoring every 3 months. ? ?#Mild anemia/CKD--hemoglobin 14- saturation 10%; continued dietary [liver]-- STABLE.  ? ?#Weight loss-patient admits to poor appetite; mild to moderate diarrhea [patient denies it is from Dasatinib]; hold off steroids given poorly controlled blood sugars.  S/p evaluation with [facility].  ? ?#Reflux-recommend holding off PPIs-given the interaction with dasatinib.  Continue Tums as needed.  ? Worsening abdominal discomfort-declines GI evaluation. ? ?#Diabetes-poorly controlled; currently on insulin pump- BG- 229; STABLE.  ? ?# DISPOSITION:MONDAY appt ?# follow up in 3 months-MD; labs- cbc/cmp/magnesium; Bcr-ABL RT-PCR; -- Dr.B ? ? ?

## 2022-02-07 NOTE — Progress Notes (Signed)
Patient has new abdominal pain/discomfort in the mornings that gets better after eating. ?

## 2022-02-12 ENCOUNTER — Other Ambulatory Visit: Payer: Self-pay | Admitting: Pharmacist

## 2022-02-12 ENCOUNTER — Encounter: Payer: Self-pay | Admitting: Emergency Medicine

## 2022-02-12 ENCOUNTER — Emergency Department: Payer: Medicare Other

## 2022-02-12 ENCOUNTER — Other Ambulatory Visit (HOSPITAL_COMMUNITY): Payer: Self-pay

## 2022-02-12 ENCOUNTER — Emergency Department
Admission: EM | Admit: 2022-02-12 | Discharge: 2022-02-12 | Disposition: A | Discharge status: Home / Self Care | Payer: Medicare Other | Attending: Emergency Medicine | Admitting: Emergency Medicine

## 2022-02-12 ENCOUNTER — Other Ambulatory Visit: Payer: Self-pay

## 2022-02-12 DIAGNOSIS — E109 Type 1 diabetes mellitus without complications: Secondary | ICD-10-CM | POA: Diagnosis not present

## 2022-02-12 DIAGNOSIS — S01511A Laceration without foreign body of lip, initial encounter: Secondary | ICD-10-CM | POA: Diagnosis not present

## 2022-02-12 DIAGNOSIS — R55 Syncope and collapse: Secondary | ICD-10-CM | POA: Diagnosis not present

## 2022-02-12 DIAGNOSIS — C9210 Chronic myeloid leukemia, BCR/ABL-positive, not having achieved remission: Secondary | ICD-10-CM

## 2022-02-12 DIAGNOSIS — Y92009 Unspecified place in unspecified non-institutional (private) residence as the place of occurrence of the external cause: Secondary | ICD-10-CM | POA: Diagnosis not present

## 2022-02-12 DIAGNOSIS — S060XAA Concussion with loss of consciousness status unknown, initial encounter: Secondary | ICD-10-CM | POA: Insufficient documentation

## 2022-02-12 DIAGNOSIS — W01198A Fall on same level from slipping, tripping and stumbling with subsequent striking against other object, initial encounter: Secondary | ICD-10-CM | POA: Insufficient documentation

## 2022-02-12 DIAGNOSIS — S0181XA Laceration without foreign body of other part of head, initial encounter: Secondary | ICD-10-CM | POA: Diagnosis not present

## 2022-02-12 DIAGNOSIS — M79641 Pain in right hand: Secondary | ICD-10-CM | POA: Insufficient documentation

## 2022-02-12 DIAGNOSIS — S0990XA Unspecified injury of head, initial encounter: Secondary | ICD-10-CM | POA: Diagnosis present

## 2022-02-12 LAB — CBC WITH DIFFERENTIAL/PLATELET
Abs Immature Granulocytes: 0.02 10*3/uL (ref 0.00–0.07)
Basophils Absolute: 0 10*3/uL (ref 0.0–0.1)
Basophils Relative: 0 %
Eosinophils Absolute: 0.5 10*3/uL (ref 0.0–0.5)
Eosinophils Relative: 5 %
HCT: 38.9 % — ABNORMAL LOW (ref 39.0–52.0)
Hemoglobin: 12.9 g/dL — ABNORMAL LOW (ref 13.0–17.0)
Immature Granulocytes: 0 %
Lymphocytes Relative: 13 %
Lymphs Abs: 1.2 10*3/uL (ref 0.7–4.0)
MCH: 30.1 pg (ref 26.0–34.0)
MCHC: 33.2 g/dL (ref 30.0–36.0)
MCV: 90.9 fL (ref 80.0–100.0)
Monocytes Absolute: 0.6 10*3/uL (ref 0.1–1.0)
Monocytes Relative: 7 %
Neutro Abs: 6.6 10*3/uL (ref 1.7–7.7)
Neutrophils Relative %: 75 %
Platelets: 202 10*3/uL (ref 150–400)
RBC: 4.28 MIL/uL (ref 4.22–5.81)
RDW: 14.8 % (ref 11.5–15.5)
WBC: 8.9 10*3/uL (ref 4.0–10.5)
nRBC: 0 % (ref 0.0–0.2)

## 2022-02-12 LAB — COMPREHENSIVE METABOLIC PANEL
ALT: 24 U/L (ref 0–44)
AST: 38 U/L (ref 15–41)
Albumin: 4.3 g/dL (ref 3.5–5.0)
Alkaline Phosphatase: 59 U/L (ref 38–126)
Anion gap: 6 (ref 5–15)
BUN: 14 mg/dL (ref 8–23)
CO2: 23 mmol/L (ref 22–32)
Calcium: 9 mg/dL (ref 8.9–10.3)
Chloride: 106 mmol/L (ref 98–111)
Creatinine, Ser: 1.18 mg/dL (ref 0.61–1.24)
GFR, Estimated: >60 mL/min (ref >60)
Glucose, Bld: 103 mg/dL — ABNORMAL HIGH (ref 70–99)
Potassium: 3.4 mmol/L — ABNORMAL LOW (ref 3.5–5.1)
Sodium: 135 mmol/L (ref 135–145)
Total Bilirubin: 0.5 mg/dL (ref 0.3–1.2)
Total Protein: 7.9 g/dL (ref 6.5–8.1)

## 2022-02-12 LAB — BCR-ABL1 FISH
Cells Analyzed: 200
Cells Counted: 200

## 2022-02-12 LAB — TROPONIN I (HIGH SENSITIVITY): Troponin I (High Sensitivity): 18 ng/L — ABNORMAL HIGH (ref <18)

## 2022-02-12 MED ORDER — DASATINIB 100 MG PO TABS
100.0000 mg | ORAL_TABLET | Freq: Every day | ORAL | 3 refills | Status: DC
  Filled 2022-02-15: qty 30, 30d supply, fill #0
  Filled 2022-03-12: qty 30, 30d supply, fill #1
  Filled 2022-04-12: qty 30, 30d supply, fill #2
  Filled 2022-05-10: qty 30, 30d supply, fill #3

## 2022-02-12 MED ORDER — LIDOCAINE HCL (PF) 1 % IJ SOLN
INTRAMUSCULAR | Status: AC
  Administered 2022-02-12: 5 mL via INTRADERMAL
  Filled 2022-02-12: qty 5

## 2022-02-12 MED ORDER — LIDOCAINE HCL (PF) 1 % IJ SOLN
5.0000 mL | Freq: Once | INTRAMUSCULAR | Status: AC
  Administered 2022-02-12: 5 mL via INTRADERMAL
  Filled 2022-02-12: qty 5

## 2022-02-12 NOTE — ED Notes (Signed)
Patient to CT at this time

## 2022-02-12 NOTE — ED Triage Notes (Signed)
Patient to ED via [facility] from home for syncopal episode. Patient denies blood thinners- lac on chin. C/o pain on nose, chin and right hand. Aox4. Patient is currently being treated for leukemia.  ?

## 2022-02-12 NOTE — ED Provider Notes (Signed)
[facility] Provider Note   Event Date/Time   First MD Initiated Contact with Patient 02/12/22 1659     (approximate) History  Loss of Consciousness  HPI Frank [name] is a 61 y.o. male with a stated past medical history of type 1 diabetes with insulin pump in place who presents after an episode of choking on a cracker today that caused him to lose consciousness and hit his face on the cement.  Patient now complains of nose pain, a laceration to his upper lip as well as a laceration under his chin.  Patient denies any subsequent loss of consciousness, lightheadedness, chest pain, shortness of breath, or weakness/numbness/paresthesias in any extremity.  Patient does endorse associated right hand pain after the fall but is able to range it as well as has adequate grip strength.  Patient denies any blood thinner use.  Patient states he is up-to-date on his tetanus vaccination after a fall couple years ago Physical Exam  Triage Vital Signs: ED Triage Vitals  Enc Vitals Group     BP 02/12/22 1642 129/78     Pulse Rate 02/12/22 1642 70     Resp 02/12/22 1642 18     Temp 02/12/22 1642 98.2 F (36.8 C)     Temp Source 02/12/22 1642 Oral     SpO2 02/12/22 1638 98 %     Weight 02/12/22 1641 177 lb (80.3 kg)     Height 02/12/22 1641 5\' 8"  (1.727 m)     Head Circumference --      Peak Flow --      Pain Score 02/12/22 1640 5     Pain Loc --      Pain Edu? --      Excl. in GC? --    Most recent vital signs: Vitals:   02/12/22 1803 02/12/22 1830  BP: (!) 187/70 (!) 167/80  Pulse: 75 62  Resp: 16 15  Temp:    SpO2: 99% 100%   General: Awake, oriented x4. CV:  Good peripheral perfusion.  Resp:  Normal effort.  Abd:  No distention.  Other:  Middle-aged African-American male laying in bed in no distress with a 1 cm vertical laceration to the right aspect of the upper lip that involves the vermilion border.  Patient also has a subcentimeter laceration under the  chin ED Results / Procedures / Treatments  Labs (all labs ordered are listed, but only abnormal results are displayed) Labs Reviewed  COMPREHENSIVE METABOLIC PANEL - Abnormal; Notable for the following components:      Result Value   Potassium 3.4 (*)    Glucose, Bld 103 (*)    All other components within normal limits  CBC WITH DIFFERENTIAL/PLATELET - Abnormal; Notable for the following components:   Hemoglobin 12.9 (*)    HCT 38.9 (*)    All other components within normal limits  TROPONIN I (HIGH SENSITIVITY) - Abnormal; Notable for the following components:   Troponin I (High Sensitivity) 18 (*)    All other components within normal limits  TROPONIN I (HIGH SENSITIVITY)   EKG ED ECG REPORT I, Merwyn Katos, the attending physician, personally viewed and interpreted this ECG. Date: 02/12/2022 EKG Time: 1644 Rate: 69 Rhythm: normal sinus rhythm QRS Axis: normal Intervals: normal ST/T Wave abnormalities: normal Narrative Interpretation: no evidence of acute ischemia RADIOLOGY ED MD interpretation:   CT of the head without contrast interpreted by me shows no evidence of acute abnormalities including no intracerebral hemorrhage,  obvious masses, or significant edema CT of the cervical spine interpreted by me does not show any evidence of acute abnormalities including no acute fracture, malalignment, height loss, or dislocation CT of the maxillofacial structures interpreted by me shows swelling around the nasal passages without any evidence of fracture or dislocation -Agree with radiology assessment Official radiology report(s): CT Head Wo Contrast  Result Date: 02/12/2022 CLINICAL DATA:  Head trauma, moderate-severe; Neck trauma, dangerous injury mechanism (Age [age]y). Patient to ED via [facility] from home for syncopal episode. Patient denies blood thinners- lac on chin. C/o pain on nose, chin and right hand. Aox4. Patient is currently being treated for leukemia EXAM: CT HEAD  WITHOUT CONTRAST CT MAXILLOFACIAL WITHOUT CONTRAST CT CERVICAL SPINE WITHOUT CONTRAST TECHNIQUE: Multidetector CT imaging of the head, cervical spine, and maxillofacial structures were performed using the standard protocol without intravenous contrast. Multiplanar CT image reconstructions of the cervical spine and maxillofacial structures were also generated. RADIATION DOSE REDUCTION: This exam was performed according to the departmental dose-optimization program which includes automated exposure control, adjustment of the mA and/or kV according to patient size and/or use of iterative reconstruction technique. COMPARISON:  CT head 02/20/11 FINDINGS: CT HEAD FINDINGS Brain: No evidence of large-territorial acute infarction. No parenchymal hemorrhage. No mass lesion. No extra-axial collection. No mass effect or midline shift. No hydrocephalus. Basilar cisterns are patent. Vascular: No hyperdense vessel. Atherosclerotic calcifications are present within the cavernous internal carotid arteries. Skull: No acute fracture or focal lesion. Other: None. CT MAXILLOFACIAL FINDINGS Osseous: No fracture or mandibular dislocation. No destructive process. Patient is edentulous. Sinuses/Orbits: Paranasal sinuses and mastoid air cells are clear. The orbits are unremarkable. Soft tissues: Negative. CT CERVICAL SPINE FINDINGS Alignment: Normal. Skull base and vertebrae: Degenerative changes at the C4-C5 level. No acute fracture. No aggressive appearing focal osseous lesion or focal pathologic process. Soft tissues and spinal canal: No prevertebral fluid or swelling. No visible canal hematoma. Upper chest: At least moderate volume left and small volume right lobe. Other: None. IMPRESSION: 1. No acute intracranial abnormality. 2. No acute displaced facial fracture. 3. No acute displaced fracture or traumatic listhesis of the cervical spine. 4. At least moderate volume left and small volume right lobe. Recommend chest x-ray for further  evaluation. Electronically Signed   By: Tish Frederickson M.D.   On: 02/12/2022 17:40   CT Cervical Spine Wo Contrast  Result Date: 02/12/2022 CLINICAL DATA:  Head trauma, moderate-severe; Neck trauma, dangerous injury mechanism (Age [age]y). Patient to ED via [facility] from home for syncopal episode. Patient denies blood thinners- lac on chin. C/o pain on nose, chin and right hand. Aox4. Patient is currently being treated for leukemia EXAM: CT HEAD WITHOUT CONTRAST CT MAXILLOFACIAL WITHOUT CONTRAST CT CERVICAL SPINE WITHOUT CONTRAST TECHNIQUE: Multidetector CT imaging of the head, cervical spine, and maxillofacial structures were performed using the standard protocol without intravenous contrast. Multiplanar CT image reconstructions of the cervical spine and maxillofacial structures were also generated. RADIATION DOSE REDUCTION: This exam was performed according to the departmental dose-optimization program which includes automated exposure control, adjustment of the mA and/or kV according to patient size and/or use of iterative reconstruction technique. COMPARISON:  CT head 02/20/11 FINDINGS: CT HEAD FINDINGS Brain: No evidence of large-territorial acute infarction. No parenchymal hemorrhage. No mass lesion. No extra-axial collection. No mass effect or midline shift. No hydrocephalus. Basilar cisterns are patent. Vascular: No hyperdense vessel. Atherosclerotic calcifications are present within the cavernous internal carotid arteries. Skull: No acute fracture or focal lesion. Other: None.  CT MAXILLOFACIAL FINDINGS Osseous: No fracture or mandibular dislocation. No destructive process. Patient is edentulous. Sinuses/Orbits: Paranasal sinuses and mastoid air cells are clear. The orbits are unremarkable. Soft tissues: Negative. CT CERVICAL SPINE FINDINGS Alignment: Normal. Skull base and vertebrae: Degenerative changes at the C4-C5 level. No acute fracture. No aggressive appearing focal osseous lesion or focal pathologic  process. Soft tissues and spinal canal: No prevertebral fluid or swelling. No visible canal hematoma. Upper chest: At least moderate volume left and small volume right lobe. Other: None. IMPRESSION: 1. No acute intracranial abnormality. 2. No acute displaced facial fracture. 3. No acute displaced fracture or traumatic listhesis of the cervical spine. 4. At least moderate volume left and small volume right lobe. Recommend chest x-ray for further evaluation. Electronically Signed   By: Tish Frederickson M.D.   On: 02/12/2022 17:40   DG Hand Complete Right  Result Date: 02/12/2022 CLINICAL DATA:  Post fall with right hand pain.  Syncopal episode. EXAM: RIGHT HAND - COMPLETE 3+ VIEW COMPARISON:  None. FINDINGS: There is no evidence of fracture or dislocation. 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 No erosions. Soft tissues are unremarkable. IMPRESSION: 1. No fracture or subluxation of the right hand. 2. Osteoarthritis. Electronically Signed   By: Narda Rutherford M.D.   On: 02/12/2022 17:43   CT MAXILLOFACIAL WO CONTRAST  Result Date: 02/12/2022 CLINICAL DATA:  Head trauma, moderate-severe; Neck trauma, dangerous injury mechanism (Age [age]y). Patient to ED via [facility] from home for syncopal episode. Patient denies blood thinners- lac on chin. C/o pain on nose, chin and right hand. Aox4. Patient is currently being treated for leukemia EXAM: CT HEAD WITHOUT CONTRAST CT MAXILLOFACIAL WITHOUT CONTRAST CT CERVICAL SPINE WITHOUT CONTRAST TECHNIQUE: Multidetector CT imaging of the head, cervical spine, and maxillofacial structures were performed using the standard protocol without intravenous contrast. Multiplanar CT image reconstructions of the cervical spine and maxillofacial structures were also generated. RADIATION DOSE REDUCTION: This exam was performed according to the departmental dose-optimization program which includes automated exposure control, adjustment of the mA and/or  kV according to patient size and/or use of iterative reconstruction technique. COMPARISON:  CT head 02/20/11 FINDINGS: CT HEAD FINDINGS Brain: No evidence of large-territorial acute infarction. No parenchymal hemorrhage. No mass lesion. No extra-axial collection. No mass effect or midline shift. No hydrocephalus. Basilar cisterns are patent. Vascular: No hyperdense vessel. Atherosclerotic calcifications are present within the cavernous internal carotid arteries. Skull: No acute fracture or focal lesion. Other: None. CT MAXILLOFACIAL FINDINGS Osseous: No fracture or mandibular dislocation. No destructive process. Patient is edentulous. Sinuses/Orbits: Paranasal sinuses and mastoid air cells are clear. The orbits are unremarkable. Soft tissues: Negative. CT CERVICAL SPINE FINDINGS Alignment: Normal. Skull base and vertebrae: Degenerative changes at the C4-C5 level. No acute fracture. No aggressive appearing focal osseous lesion or focal pathologic process. Soft tissues and spinal canal: No prevertebral fluid or swelling. No visible canal hematoma. Upper chest: At least moderate volume left and small volume right lobe. Other: None. IMPRESSION: 1. No acute intracranial abnormality. 2. No acute displaced facial fracture. 3. No acute displaced fracture or traumatic listhesis of the cervical spine. 4. At least moderate volume left and small volume right lobe. Recommend chest x-ray for further evaluation. Electronically Signed   By: Tish Frederickson M.D.   On: 02/12/2022 17:40   PROCEDURES: Critical Care performed: No .1-3 Lead EKG Interpretation Performed by: Merwyn Katos, MD Authorized by: Merwyn Katos, MD     Interpretation: normal  ECG rate:  62   ECG rate assessment: normal     Rhythm: sinus rhythm     Ectopy: none     Conduction: normal   ..Laceration Repair  Date/Time: 02/12/2022 7:03 PM Performed by: Merwyn Katos, MD Authorized by: Merwyn Katos, MD   Consent:    Consent obtained:   Verbal   Consent given by:  Patient   Risks, benefits, and alternatives were discussed: yes     Risks discussed:  Infection, poor cosmetic result and poor wound healing   Alternatives discussed:  No treatment and delayed treatment Universal protocol:    Immediately prior to procedure, a time out was called: yes     Patient identity confirmed:  Verbally with patient Anesthesia:    Anesthesia method:  Local infiltration   Local anesthetic:  Lidocaine 1% w/o epi Laceration details:    Location:  Lip   Lip location:  Upper exterior lip   Length (cm):  1   Depth (mm):  10 Pre-procedure details:    Preparation:  Patient was prepped and draped in usual sterile fashion and imaging obtained to evaluate for foreign bodies Exploration:    Limited defect created (wound extended): no     Contaminated: no   Treatment:    Area cleansed with:  Povidone-iodine and saline   Amount of cleaning:  Standard   Irrigation solution:  Sterile saline   Irrigation volume:  200   Irrigation method:  Syringe   Visualized foreign bodies/material removed: no     Debridement:  None   Undermining:  None   Scar revision: no   Skin repair:    Repair method:  Sutures   Suture size:  4-0   Suture material:  Nylon   Suture technique:  Simple interrupted   Number of sutures:  3 Approximation:    Approximation:  Close   Vermilion border well-aligned: yes   Repair type:    Repair type:  Simple Post-procedure details:    Dressing:  Open (no dressing)   Procedure completion:  Tolerated well, no immediate complications MEDICATIONS ORDERED IN ED: Medications  lidocaine (PF) (XYLOCAINE) 1 % injection 5 mL (5 mLs Intradermal Given by Other 02/12/22 1819)   IMPRESSION / MDM / ASSESSMENT AND PLAN / ED COURSE  I reviewed the triage vital signs and the nursing notes.                             The patient is on the cardiac monitor to evaluate for evidence of arrhythmia and/or significant heart rate  changes. Presenting after a fall that occurred just prior to arrival, resulting in injury to the right aspect of the upper lip and underneath the chin. The mechanism of injury was a mechanical ground level fall without syncope or near-syncope. The current level of pain is moderate. There was no loss of consciousness, confusion, seizure, or memory impairment. There is a laceration associated with the injury. Denies neck pain. The patient does not take blood thinner medications Denies vomiting, numbness/weakness, fever CT of the head, C-spine, max/face does not show any evidence of acute abnormalities. Patient likely had the syncopal episode due to choking on what ever piece of food he was doing at the time.  Patient has not had any episodes similar to this in the past and denies any subsequent red flag symptomatology after this event.  Patient given concussion precautions as well as strict return precautions regarding  the laceration that will need sutures removed in the next 7-10 days.  Patient expressed understanding and all questions were answered prior to discharge Dispo: Discharge with PCP follow-up       FINAL CLINICAL IMPRESSION(S) / ED DIAGNOSES   Final diagnoses:  Syncope and collapse  Lip laceration, initial encounter  Chin laceration, initial encounter  Concussion with unknown loss of consciousness status, initial encounter   Rx / DC Orders   ED Discharge Orders     None      Note:  This document was prepared using Dragon voice recognition software and may include unintentional dictation errors.   Merwyn Katos, MD 02/12/22 [phone]

## 2022-02-15 ENCOUNTER — Other Ambulatory Visit (HOSPITAL_COMMUNITY): Payer: Self-pay

## 2022-02-15 LAB — BCR-ABL1, CML/ALL, PCR, QUANT: Interpretation (BCRAL):: NEGATIVE

## 2022-02-19 ENCOUNTER — Encounter: Payer: Self-pay | Admitting: Emergency Medicine

## 2022-02-19 ENCOUNTER — Emergency Department
Admission: EM | Admit: 2022-02-19 | Discharge: 2022-02-19 | Disposition: A | Discharge status: Home / Self Care | Payer: Medicare Other | Attending: Emergency Medicine | Admitting: Emergency Medicine

## 2022-02-19 ENCOUNTER — Other Ambulatory Visit: Payer: Self-pay

## 2022-02-19 DIAGNOSIS — Z4802 Encounter for removal of sutures: Secondary | ICD-10-CM | POA: Insufficient documentation

## 2022-02-19 NOTE — ED Triage Notes (Signed)
Pt to ED to get his stitches out. No s/s of infection.  ?

## 2022-02-19 NOTE — ED Provider Notes (Signed)
? ? ?[facility] ?Emergency Department Provider Note ? ? ? ? Event Date/Time  ? First MD Initiated Contact with Patient 02/19/22 1406   ?  (approximate) ? ? ?History  ? ?Suture / Staple Removal ? ? ?HPI ? ?Frank [name] is a 61 y.o. male presents to the ED for suture removal.  Patient denies any interim complaints. ?  ? ? ?Physical Exam  ? ?Triage Vital Signs: ?ED Triage Vitals  ?Enc Vitals Group  ?   BP 02/19/22 1336 131/61  ?   Pulse Rate 02/19/22 1336 78  ?   Resp 02/19/22 1336 16  ?   Temp 02/19/22 1336 97.9 ?F (36.6 ?C)  ?   Temp Source 02/19/22 1336 Oral  ?   SpO2 02/19/22 1336 100 %  ?   Weight 02/19/22 1409 176 lb 12.9 oz (80.2 kg)  ?   Height 02/19/22 1337 '5\' 8"'$  (1.727 m)  ?   Head Circumference --   ?   Peak Flow --   ?   Pain Score 02/19/22 1337 0  ?   Pain Loc --   ?   Pain Edu? --   ?   Excl. in [place]? --   ? ? ?Most recent vital signs: ?Vitals:  ? 02/19/22 1336  ?BP: 131/61  ?Pulse: 78  ?Resp: 16  ?Temp: 97.9 ?F (36.6 ?C)  ?SpO2: 100%  ? ? ?General Awake, no distress.  ?HEENT NCAT. PERRL. EOMI. No rhinorrhea. Mucous membranes are moist.  Upper lip with 3 nylon sutures in place.  Some surrounding scab is noted. ?CV:  Good peripheral perfusion.  ?RESP:  Normal effort.  ?ABD:  No distention.  ? ? ?ED Results / Procedures / Treatments  ? ?Labs ?(all labs ordered are listed, but only abnormal results are displayed) ?Labs Reviewed - No data to display ? ? ?EKG ? ? ?RADIOLOGY ? ? ?No results found. ? ? ?PROCEDURES: ? ?Critical Care performed: No ? ?.Suture Removal ? ?Date/Time: 02/19/2022 2:20 PM ?Performed by: Melvenia Needles, PA-C ?Authorized by: Melvenia Needles, PA-C  ? ?Consent:  ?  Consent obtained:  Verbal ?  Consent given by:  Patient ?  Risks, benefits, and alternatives were discussed: yes   ?  Risks discussed:  Pain ?Universal protocol:  ?  Site/side marked: yes   ?  Patient identity confirmed:  Verbally with patient ?Location:  ?  Location:  Mouth ?  Mouth  location:  Upper exterior lip ?Procedure details:  ?  Wound appearance:  No signs of infection and good wound healing ?  Number of sutures removed:  3 ?Post-procedure details:  ?  Post-removal:  No dressing applied ?  Procedure completion:  Tolerated well, no immediate complications ? ? ?MEDICATIONS ORDERED IN ED: ?Medications - No data to display ? ? ?IMPRESSION / MDM / ASSESSMENT AND PLAN / ED COURSE  ?I reviewed the triage vital signs and the nursing notes. ?             ?               ? ?Differential diagnosis includes, but is not limited to, suture removal, wound dehiscence, wound infection ? ?Patient to the ED without complaint for suture removal.  Patient's diagnosis is consistent with suture removal.  3 sutures removed without difficulty. Patient is to follow up with his primary provider as needed or otherwise directed. Patient is given ED precautions to return to the ED for  any worsening or new symptoms. ? ? ? ?FINAL CLINICAL IMPRESSION(S) / ED DIAGNOSES  ? ?Final diagnoses:  ?Visit for suture removal  ? ? ? ?Rx / DC Orders  ? ?ED Discharge Orders   ? ? None  ? ?  ? ? ? ?Note:  This document was prepared using Dragon voice recognition software and may include unintentional dictation errors. ? ?  ?Melvenia Needles, PA-C ?02/19/22 1922 ? ?  ?Rada Hay, MD ?02/20/22 [phone] ? ?

## 2022-02-19 NOTE — ED Notes (Signed)
Pt gave verbal consent to DC and pt refused DC vitals  ?

## 2022-02-20 ENCOUNTER — Other Ambulatory Visit (HOSPITAL_COMMUNITY): Payer: Self-pay

## 2022-02-28 ENCOUNTER — Other Ambulatory Visit (HOSPITAL_COMMUNITY): Payer: Self-pay

## 2022-03-12 ENCOUNTER — Other Ambulatory Visit (HOSPITAL_COMMUNITY): Payer: Self-pay

## 2022-03-18 ENCOUNTER — Other Ambulatory Visit (HOSPITAL_COMMUNITY): Payer: Self-pay

## 2022-03-21 ENCOUNTER — Other Ambulatory Visit (HOSPITAL_COMMUNITY): Payer: Self-pay

## 2022-04-12 ENCOUNTER — Other Ambulatory Visit (HOSPITAL_COMMUNITY): Payer: Self-pay

## 2022-04-23 ENCOUNTER — Other Ambulatory Visit (HOSPITAL_COMMUNITY): Payer: Self-pay

## 2022-04-29 ENCOUNTER — Other Ambulatory Visit (HOSPITAL_COMMUNITY): Payer: Self-pay

## 2022-05-06 ENCOUNTER — Inpatient Hospital Stay: Payer: Medicare Other | Attending: Internal Medicine

## 2022-05-06 ENCOUNTER — Inpatient Hospital Stay (HOSPITAL_BASED_OUTPATIENT_CLINIC_OR_DEPARTMENT_OTHER): Payer: Medicare Other | Admitting: Internal Medicine

## 2022-05-06 ENCOUNTER — Encounter: Payer: Self-pay | Admitting: Internal Medicine

## 2022-05-06 ENCOUNTER — Other Ambulatory Visit: Payer: Self-pay

## 2022-05-06 DIAGNOSIS — I129 Hypertensive chronic kidney disease with stage 1 through stage 4 chronic kidney disease, or unspecified chronic kidney disease: Secondary | ICD-10-CM | POA: Insufficient documentation

## 2022-05-06 DIAGNOSIS — D631 Anemia in chronic kidney disease: Secondary | ICD-10-CM | POA: Diagnosis not present

## 2022-05-06 DIAGNOSIS — E1122 Type 2 diabetes mellitus with diabetic chronic kidney disease: Secondary | ICD-10-CM | POA: Diagnosis not present

## 2022-05-06 DIAGNOSIS — C9210 Chronic myeloid leukemia, BCR/ABL-positive, not having achieved remission: Secondary | ICD-10-CM

## 2022-05-06 DIAGNOSIS — N189 Chronic kidney disease, unspecified: Secondary | ICD-10-CM | POA: Insufficient documentation

## 2022-05-06 LAB — CBC WITH DIFFERENTIAL/PLATELET
Abs Immature Granulocytes: 0.01 10*3/uL (ref 0.00–0.07)
Basophils Absolute: 0 10*3/uL (ref 0.0–0.1)
Basophils Relative: 1 %
Eosinophils Absolute: 0.3 10*3/uL (ref 0.0–0.5)
Eosinophils Relative: 6 %
HCT: 41.1 % (ref 39.0–52.0)
Hemoglobin: 13.8 g/dL (ref 13.0–17.0)
Immature Granulocytes: 0 %
Lymphocytes Relative: 21 %
Lymphs Abs: 1.1 10*3/uL (ref 0.7–4.0)
MCH: 31 pg (ref 26.0–34.0)
MCHC: 33.6 g/dL (ref 30.0–36.0)
MCV: 92.4 fL (ref 80.0–100.0)
Monocytes Absolute: 0.4 10*3/uL (ref 0.1–1.0)
Monocytes Relative: 8 %
Neutro Abs: 3.5 10*3/uL (ref 1.7–7.7)
Neutrophils Relative %: 64 %
Platelets: 202 10*3/uL (ref 150–400)
RBC: 4.45 MIL/uL (ref 4.22–5.81)
RDW: 14.6 % (ref 11.5–15.5)
WBC: 5.4 10*3/uL (ref 4.0–10.5)
nRBC: 0 % (ref 0.0–0.2)

## 2022-05-06 LAB — COMPREHENSIVE METABOLIC PANEL
ALT: 21 U/L (ref 0–44)
AST: 27 U/L (ref 15–41)
Albumin: 4 g/dL (ref 3.5–5.0)
Alkaline Phosphatase: 62 U/L (ref 38–126)
Anion gap: 5 (ref 5–15)
BUN: 17 mg/dL (ref 8–23)
CO2: 23 mmol/L (ref 22–32)
Calcium: 8.3 mg/dL — ABNORMAL LOW (ref 8.9–10.3)
Chloride: 109 mmol/L (ref 98–111)
Creatinine, Ser: 1.17 mg/dL (ref 0.61–1.24)
GFR, Estimated: >60 mL/min (ref >60)
Glucose, Bld: 268 mg/dL — ABNORMAL HIGH (ref 70–99)
Potassium: 3.9 mmol/L (ref 3.5–5.1)
Sodium: 137 mmol/L (ref 135–145)
Total Bilirubin: 0.4 mg/dL (ref 0.3–1.2)
Total Protein: 7.5 g/dL (ref 6.5–8.1)

## 2022-05-06 LAB — MAGNESIUM: Magnesium: 2.1 mg/dL (ref 1.7–2.4)

## 2022-05-06 NOTE — Progress Notes (Signed)
[facility] CONSULT NOTE  Patient Care Team: System, Provider Not In as PCP - General  CHIEF COMPLAINTS/PURPOSE OF CONSULTATION: CML  #  Oncology History Overview Note  Result Comment:   Comment: (NOTE)  ABNORMAL: 89% OF NUCLEI POSITIVE FOR BCR/ABL1 GENE FUSION  SIGNAL(S)     # MARCH 2022--  Hypercellular bone marrow with involvement by chronic myeloid  leukemia, BCR-ABL1-positive . he bone marrow is hypercellular for age with myeloid and megakaryocytic  hyperplasia.  There is no increase in blasts by manual differential count of aspirate smears (<1%) or by CD34 immunohistochemistry on the core biopsy.  A reticulin special stain reveals no significant increase  in reticulin fibrosis (MF grade 0 of 3).  # April 1st week -DASATINIB 100 mg; July 2022-FISH BCR ABL-negative for 9:22 abnormality [optimal; reached 25-monthmilestone. MMR at 10 months [gaol at 145]  #hypertension /alcoholic liver/ type 2 diabetes on insulin poorly controlled chronic back pain    [place]ronic myeloid [place] ([facility]  01/31/2021 Initial Diagnosis   [place]      HISTORY OF PRESENTING ILLNESS: Ambulating independently.  Accompanied by his wife.  Frank Class89y.o.  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  Patient complains of diarrhea continue stools a day.  He has not been taking any Imodium or any other antidiarrhea. Otherwise tolerating Dasatinib well.   Is concerned about weight loss.  No fever no chills.  No abdominal pain.  Admits to taking his pills on time/daily.   Review of Systems  Constitutional:  Positive for malaise/fatigue. Negative for chills, diaphoresis, fever and weight loss.  HENT:  Negative for nosebleeds and sore throat.   Eyes:  Negative for double vision.  Respiratory:  Negative for cough, hemoptysis, sputum production, shortness of breath and wheezing.   Cardiovascular:  Negative for chest pain,  palpitations, orthopnea and leg swelling.  Gastrointestinal:  Negative for abdominal pain, blood in stool, constipation, diarrhea, heartburn, melena, nausea and vomiting.  Genitourinary:  Negative for dysuria, frequency and urgency.  Musculoskeletal:  Positive for back pain and joint pain.  Skin: Negative.  Negative for itching and rash.  Neurological:  Negative for dizziness, tingling, focal weakness, weakness and headaches.  Endo/Heme/Allergies:  Does not bruise/bleed easily.  Psychiatric/Behavioral:  Negative for depression. The patient is not nervous/anxious and does not have insomnia.      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 No pertinent surgical history.  SOCIAL HISTORY: Social History   Socioeconomic History   Marital status: Single    Spouse name: Not on file   Number of children: Not on file   Years of education: Not on file   Highest education level: Not on file  Occupational History   Occupation: retired     Comment: custodian   Tobacco Use   Smoking status: Some Days    Types: Cigarettes   Smokeless tobacco: Never   Tobacco comments:    4 cigarettes yesterday   Vaping Use   Vaping Use:  Never used  Substance and Sexual Activity   Alcohol use: Yes    Comment: hx of ETOH abuse   Drug use: Yes    Types: Cocaine, Marijuana    Comment: had cocaine Sat. (2 days ago)   Sexual activity: Not on file  Other Topics Concern   Not on file  Social History Narrative   Lives with S.O. Frank [name]    Social Determinants of Health   Financial Resource Strain: Not on file  Food Insecurity: Not on file  Transportation  Needs: Not on file  Physical Activity: Not on file  Stress: Not on file  Social Connections: Not on file  Intimate Partner Violence: Not on file    FAMILY HISTORY: Family History  Problem Relation Age of Onset   Diabetes Mother    Hypertension Mother    Heart failure Mother    Hyperlipidemia Mother    Thyroid disease Mother    Alcohol abuse Father    Emphysema Father    Kidney failure Father    Gout Father    Cancer Sister    Hyperlipidemia Sister    Hypertension Sister    Rheum arthritis Sister    Fibromyalgia Sister    Thyroid disease Sister     ALLERGIES:  has No Known Allergies.  MEDICATIONS:  Current Outpatient Medications  Medication Sig Dispense Refill   amLODipine (NORVASC) 10 MG tablet Take 0.5 tablets by mouth daily.     aspirin 81 MG EC tablet Take 1 tablet by mouth daily. With a meal     Brinzolamide-Brimonidine 1-0.2 % SUSP Place 1 drop into both eyes in the morning, at noon, and at bedtime.     Cholecalciferol 25 MCG (1000 UT) tablet Take 2 tablets by mouth daily.     dasatinib (SPRYCEL) 100 MG tablet Take 1 tablet (100 mg total) by mouth daily. 30 tablet 3   fluticasone (FLONASE) 50 MCG/ACT nasal spray INSTILL 1 SPRAY INTO EACH NOSTRIL EVERY DAY AS NEEDED MAXIMUM 2 SPRAYS IN EACH NOSTRIL DAILY. FOR ALLERGIES     guaiFENesin (MUCINEX) 600 MG 12 hr tablet TAKE ONE TABLET BY MOUTH TWO TIMES A DAY AS NEEDED FOR SINUS CONGESTION     insulin aspart (NOVOLOG) 100 UNIT/ML injection Inject 100 Units into the skin continuous. INJECT 100 UNITS UNDER SKIN CONTINUOUS-VIA-PUMP AS DIRECTED FOR DIABETES.DISCARD BOTTLE 28 DAYS AFTER OPENING     insulin glargine (LANTUS) 100 UNIT/ML injection      latanoprost (XALATAN) 0.005 % ophthalmic solution Place 1 drop into both eyes at bedtime.     lipase/protease/amylase (CREON) 36000 UNITS CPEP capsule See admin instructions. TAKE 2 CAPSULES BY MOUTH WITH ACTIVE MEALS AND TAKE 1 CAPSULE WITH SNACK     lisinopril-hydrochlorothiazide  (ZESTORETIC) 20-12.5 MG tablet Take 1 tablet by mouth daily.     methocarbamol (ROBAXIN) 500 MG tablet Take 1 tablet (500 mg total) by mouth every 8 (eight) hours as needed for muscle spasms. 90 tablet 1   nicotine (NICODERM CQ - DOSED IN MG/24 HOURS) 14 mg/24hr patch Place 14 mg onto the skin daily. APPLY ONE PATCH TO SKIN EVERY DAY ACTIVE APPLY ONE PATCH WHEN YOU WAKE UP AND REMOVE THE OLD PATCH. PEEL THE BACK OFF THE PATCH AND PUT IT ON CLEAN,DRY, HAIR-FREE SKIN ON THE UPPER ARM, CHEST OR BACK     nicotine polacrilex (COMMIT) 4 MG lozenge Take 4 mg by mouth as needed. DISSOLVE 1 LOZENGE BY MOUTH AS DIRECTED - USE AT LEAST 8-10 LOZENGES EACH  DAY TO START. DO NOT USE MORE THAN 20 LOZENGES     pregabalin (LYRICA) 50 MG capsule Take 1 capsule (50 mg total) by mouth 3 (three) times daily. 90 capsule 1   rosuvastatin (CRESTOR) 20 MG tablet Take 10 mg by mouth daily.     sodium chloride (OCEAN) 0.65 % nasal spray SPRAY 2 SPRAYS INTO EACH NOSTRIL FOUR TIMES A DAY FOR SINUS CONGESTION     timolol (TIMOPTIC) 0.5 % ophthalmic solution Place 1 drop into both eyes 2 (two) times daily.     nicotine (NICODERM CQ - DOSED IN MG/24 HOURS) 21 mg/24hr patch Place onto the skin. (Patient not taking: Reported on 10/08/2021)     No current facility-administered medications for this visit.      Marland Kitchen  PHYSICAL EXAMINATION: ECOG PERFORMANCE STATUS: 0 - Asymptomatic  Vitals:   05/06/22 1100  BP: 132/82  Pulse: 61  Resp: 18  Temp: 98.5 F (36.9 C)   Filed Weights   05/06/22 1100  Weight: 174 lb 6.4 oz (79.1 kg)    Physical Exam Constitutional:      Comments: Patient ambulating independently.  He is accompanied by wife/significant other.  HENT:     Head: Normocephalic and atraumatic.     Mouth/Throat:     Pharynx: No oropharyngeal exudate.  Eyes:     Pupils: Pupils are equal, round, and reactive to light.  Cardiovascular:     Rate and Rhythm: Normal rate and regular rhythm.  Pulmonary:     Effort:  No respiratory distress.     Breath sounds: No wheezing.     Comments: Decreased air entry bilaterally. Abdominal:     General: Bowel sounds are normal. There is no distension.     Palpations: Abdomen is soft. There is no mass.     Tenderness: There is no abdominal tenderness. There is no guarding or rebound.  Musculoskeletal:        General: No tenderness. Normal range of motion.     Cervical back: Normal range of motion and neck supple.  Skin:    General: Skin is warm.  Neurological:     Mental Status: He is alert and oriented to person, place, and time.  Psychiatric:        Mood and Affect: Affect normal.      LABORATORY DATA:  I have reviewed the data as listed Lab Results  Component Value Date   WBC 5.4 05/06/2022   HGB 13.8 05/06/2022   HCT 41.1 05/06/2022   MCV 92.4 05/06/2022   PLT 202 05/06/2022   Recent Labs    02/07/22 1105 02/12/22 1651 05/06/22 1002  NA 135 135 137  K 3.8 3.4* 3.9  CL 104 106 109  CO2 _0 GLUCOSE 185* 103* 268*  BUN _1 CREATININE 1.22 1.18 1.17  CALCIUM 9.1 9.0 8.3*  GFRNONAA >60 >60 >60  PROT 7.6 7.9 7.5  ALBUMIN 4.1 4.3 4.0  AST 35 38 27  ALT _2 ALKPHOS 61 59 62  BILITOT 0.3 0.5 0.4    RADIOGRAPHIC STUDIES: I have personally reviewed the radiological images as listed and agreed with the findings in the report. No results found.  ASSESSMENT & PLAN:   Chronic myeloid leukemia ([place]) #Chronic myeloid leukemia- on dasatinib 100 mg/day [1st week of April, 2022].  Tolerating well-except for diarrhea see below  # Today white count 4.3 hemoglobin 14; platelets normal. NEGATIVE for the BCR-ABL- fusion transcripts PCR-positive-NEGATIVE. Given the  excellent response to Dasatinib recommend monitoring every 2-3 months.  #Chronic diarrhea- sec to Dasatinib- recommend using Imodium up to 3 times a day if not improved will cut down dose of Dasatinib.Marland Kitchen  #Mild anemia/CKD--hemoglobin 13.8- saturation 10%; continued  dietary [liver]-- STABLE.   #Weight loss-patient admits to poor appetite; mild to moderate diarrhea [patient denies it is from Dasatinib]-recommend better control of diarrhea see above.  #Reflux-recommend holding off PPIs-given the interaction with dasatinib.  Continue Tums as needed.  STABLE.   #Diabetes-poorly controlled; currently on insulin pump- BG- 268; STABLE [[facility]].   # DISPOSITION: afternoon appt- monday # follow up in 2 months-MD; labs- cbc/cmp/magnesium; Bcr-ABL RT-PCR; -- Dr.B   All questions were answered. The patient knows to call the clinic with any problems, questions or concerns.     Cammie Sickle, MD 05/06/2022 2:07 PM

## 2022-05-06 NOTE — Assessment & Plan Note (Addendum)
#  Chronic myeloid leukemia- on dasatinib 100 mg/day [1st week of April, 2022].  Tolerating well-except for diarrhea see below  # Today white count 4.3 hemoglobin 14; platelets normal. NEGATIVE for the BCR-ABL- fusion transcripts PCR-positive-NEGATIVE. Given the excellent response to Dasatinib recommend monitoring every 2-3 months.  #Chronic diarrhea- sec to Dasatinib- recommend using Imodium up to 3 times a day if not improved will cut down dose of Dasatinib.Marland Kitchen  #Mild anemia/CKD--hemoglobin 13.8- saturation 10%; continued dietary [liver]-- STABLE.   #Weight loss-patient admits to poor appetite; mild to moderate diarrhea [patient denies it is from Dasatinib]-recommend better control of diarrhea see above.  #Reflux-recommend holding off PPIs-given the interaction with dasatinib.  Continue Tums as needed.  STABLE.   #Diabetes-poorly controlled; currently on insulin pump- BG- 268; STABLE [[facility]].   # DISPOSITION: afternoon appt- monday # follow up in 2 months-MD; labs- cbc/cmp/magnesium; Bcr-ABL RT-PCR; -- Dr.B

## 2022-05-06 NOTE — Progress Notes (Signed)
Patient reports diarrhea after eating and has not tried OTC Imodium.    Episodes of left side abdominal pain with episode this morning.

## 2022-05-10 ENCOUNTER — Other Ambulatory Visit (HOSPITAL_COMMUNITY): Payer: Self-pay

## 2022-05-15 LAB — BCR-ABL1, CML/ALL, PCR, QUANT: E1A2 Transcript: 0.4119 %

## 2022-05-21 ENCOUNTER — Other Ambulatory Visit (HOSPITAL_COMMUNITY): Payer: Self-pay

## 2022-06-10 ENCOUNTER — Other Ambulatory Visit (HOSPITAL_COMMUNITY): Payer: Self-pay

## 2022-06-11 ENCOUNTER — Other Ambulatory Visit (HOSPITAL_COMMUNITY): Payer: Self-pay

## 2022-06-11 ENCOUNTER — Other Ambulatory Visit: Payer: Self-pay | Admitting: Pharmacist

## 2022-06-11 DIAGNOSIS — C9210 Chronic myeloid leukemia, BCR/ABL-positive, not having achieved remission: Secondary | ICD-10-CM

## 2022-06-11 MED ORDER — DASATINIB 100 MG PO TABS
100.0000 mg | ORAL_TABLET | Freq: Every day | ORAL | 3 refills | Status: DC
  Filled 2022-06-11: qty 30, 30d supply, fill #0
  Filled 2022-07-15: qty 30, 30d supply, fill #1
  Filled 2022-08-13: qty 30, 30d supply, fill #2
  Filled 2022-09-16: qty 30, 30d supply, fill #3

## 2022-06-13 ENCOUNTER — Other Ambulatory Visit (HOSPITAL_COMMUNITY): Payer: Self-pay

## 2022-06-18 ENCOUNTER — Other Ambulatory Visit (HOSPITAL_COMMUNITY): Payer: Self-pay

## 2022-07-08 ENCOUNTER — Inpatient Hospital Stay (HOSPITAL_BASED_OUTPATIENT_CLINIC_OR_DEPARTMENT_OTHER): Payer: Medicare Other | Admitting: Internal Medicine

## 2022-07-08 ENCOUNTER — Encounter: Payer: Self-pay | Admitting: Internal Medicine

## 2022-07-08 ENCOUNTER — Inpatient Hospital Stay: Payer: Medicare Other | Attending: Internal Medicine

## 2022-07-08 DIAGNOSIS — Z794 Long term (current) use of insulin: Secondary | ICD-10-CM | POA: Insufficient documentation

## 2022-07-08 DIAGNOSIS — E1122 Type 2 diabetes mellitus with diabetic chronic kidney disease: Secondary | ICD-10-CM | POA: Insufficient documentation

## 2022-07-08 DIAGNOSIS — C9210 Chronic myeloid leukemia, BCR/ABL-positive, not having achieved remission: Secondary | ICD-10-CM | POA: Diagnosis not present

## 2022-07-08 DIAGNOSIS — I129 Hypertensive chronic kidney disease with stage 1 through stage 4 chronic kidney disease, or unspecified chronic kidney disease: Secondary | ICD-10-CM | POA: Diagnosis not present

## 2022-07-08 DIAGNOSIS — N189 Chronic kidney disease, unspecified: Secondary | ICD-10-CM | POA: Diagnosis not present

## 2022-07-08 DIAGNOSIS — D631 Anemia in chronic kidney disease: Secondary | ICD-10-CM | POA: Diagnosis not present

## 2022-07-08 DIAGNOSIS — R197 Diarrhea, unspecified: Secondary | ICD-10-CM | POA: Insufficient documentation

## 2022-07-08 LAB — CBC WITH DIFFERENTIAL/PLATELET
Abs Immature Granulocytes: 0.01 10*3/uL (ref 0.00–0.07)
Basophils Absolute: 0 10*3/uL (ref 0.0–0.1)
Basophils Relative: 1 %
Eosinophils Absolute: 0.5 10*3/uL (ref 0.0–0.5)
Eosinophils Relative: 8 %
HCT: 38.9 % — ABNORMAL LOW (ref 39.0–52.0)
Hemoglobin: 13.1 g/dL (ref 13.0–17.0)
Immature Granulocytes: 0 %
Lymphocytes Relative: 22 %
Lymphs Abs: 1.4 10*3/uL (ref 0.7–4.0)
MCH: 30.8 pg (ref 26.0–34.0)
MCHC: 33.7 g/dL (ref 30.0–36.0)
MCV: 91.5 fL (ref 80.0–100.0)
Monocytes Absolute: 0.7 10*3/uL (ref 0.1–1.0)
Monocytes Relative: 10 %
Neutro Abs: 4 10*3/uL (ref 1.7–7.7)
Neutrophils Relative %: 59 %
Platelets: 174 10*3/uL (ref 150–400)
RBC: 4.25 MIL/uL (ref 4.22–5.81)
RDW: 14.6 % (ref 11.5–15.5)
WBC: 6.7 10*3/uL (ref 4.0–10.5)
nRBC: 0 % (ref 0.0–0.2)

## 2022-07-08 LAB — COMPREHENSIVE METABOLIC PANEL WITH GFR
ALT: 20 U/L (ref 0–44)
AST: 27 U/L (ref 15–41)
Albumin: 3.9 g/dL (ref 3.5–5.0)
Alkaline Phosphatase: 57 U/L (ref 38–126)
Anion gap: 10 (ref 5–15)
BUN: 20 mg/dL (ref 8–23)
CO2: 22 mmol/L (ref 22–32)
Calcium: 9.4 mg/dL (ref 8.9–10.3)
Chloride: 102 mmol/L (ref 98–111)
Creatinine, Ser: 1.32 mg/dL — ABNORMAL HIGH (ref 0.61–1.24)
GFR, Estimated: >60 mL/min (ref >60)
Glucose, Bld: 248 mg/dL — ABNORMAL HIGH (ref 70–99)
Potassium: 4.3 mmol/L (ref 3.5–5.1)
Sodium: 134 mmol/L — ABNORMAL LOW (ref 135–145)
Total Bilirubin: 0.2 mg/dL — ABNORMAL LOW (ref 0.3–1.2)
Total Protein: 7.7 g/dL (ref 6.5–8.1)

## 2022-07-08 LAB — MAGNESIUM: Magnesium: 2 mg/dL (ref 1.7–2.4)

## 2022-07-08 NOTE — Progress Notes (Signed)
Pt in for follow up, reports "not feeling the best today".

## 2022-07-08 NOTE — Assessment & Plan Note (Addendum)
#  Chronic myeloid leukemia- on dasatinib 100 mg/day [1st week of April, 2022].  Tolerating well-except for diarrhea see below  # Today white count 4.3 hemoglobin 14; platelets normal. JUNE 2023-BCR-ABL- fusion transcripts PCR-POSITIVE. ? Drug interaction [recommend spacing out TUMS;] awaiting repeat RT-PCR today.  Discussed with [facility] pharmacist regarding checking on drug drug interaction.  #Chronic diarrhea- sec to Dasatinib- recommend using Imodium up to 3 times a day if not improved will cut down dose of Dasatinib.  Stable.  #Mild anemia/CKD--hemoglobin 13.8- saturation 10%; continued dietary [liver]--stable  #Weight loss-patient admits to poor appetite; mild to moderate diarrhea [patient denies it is from Dasatinib]-recommend better control of diarrhea see above.  #Reflux-recommend holding off PPIs-given the interaction with dasatinib.  Continue Tums as needed-however space out evening dose..  STABLE.   #Diabetes-poorly controlled; currently on insulin pump- BG- 248; STABLE.  [[facility]].   # DISPOSITION: afternoon appt- monday # follow up in 2  months-MD; 2 weeks PRIOR labs- cbc/cmp/magnesium; Bcr-ABL RT-PCR; bcr-abl FISH panel-- Dr.B   

## 2022-07-08 NOTE — Progress Notes (Signed)
[facility] CONSULT NOTE  Patient Care Team: System, Provider Not In as PCP - General Cammie Sickle, MD as Consulting Physician (Oncology)  CHIEF COMPLAINTS/PURPOSE OF CONSULTATION: CML  #  Oncology History Overview Note  Result Comment:   Comment: (NOTE)  ABNORMAL: 89% OF NUCLEI POSITIVE FOR BCR/ABL1 GENE FUSION  SIGNAL(S)     # MARCH 2022--  Hypercellular bone marrow with involvement by chronic myeloid  leukemia, BCR-ABL1-positive . he bone marrow is hypercellular for age with myeloid and megakaryocytic  hyperplasia.  There is no increase in blasts by manual differential count of aspirate smears (<1%) or by CD34 immunohistochemistry on the core biopsy.  A reticulin special stain reveals no significant increase  in reticulin fibrosis (MF grade 0 of 3).  # April 1st week -DASATINIB 100 mg; July 2022-FISH BCR ABL-negative for 9:22 abnormality [optimal; reached 68-monthmilestone. MMR at 10 months [gaol at 156]  #hypertension /alcoholic liver/ type 2 diabetes on insulin poorly controlled chronic back pain    [facility] [place]  01/31/2021 Initial Diagnosis   [place]      HISTORY OF PRESENTING ILLNESS: Ambulating independently.  Accompanied by his wife.  EJosefine Class60y.o.  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  He admits to compliance with his medications.  However takes Tums couple of hours before his evening dose of Dasatinib.  Patient complains of diarrhea continue stools a day.  He has not been taking any Imodium or any other antidiarrhea.   Is concerned about weight loss.  No fever no chills.  No abdominal pain.  Admits to taking his pills on time/daily.   Review of Systems  Constitutional:  Positive for malaise/fatigue. Negative for chills, diaphoresis, fever and weight loss.  HENT:  Negative for nosebleeds and sore throat.   Eyes:  Negative for double vision.   Respiratory:  Negative for cough, hemoptysis, sputum production, shortness of breath and wheezing.   Cardiovascular:  Negative for chest pain, palpitations, orthopnea and leg swelling.  Gastrointestinal:  Negative for abdominal pain, blood in stool, constipation, diarrhea, heartburn, melena, nausea and vomiting.  Genitourinary:  Negative for dysuria, frequency and urgency.  Musculoskeletal:  Positive for back pain and joint pain.  Skin: Negative.  Negative for itching and rash.  Neurological:  Negative for dizziness, tingling, focal weakness, weakness and headaches.  Endo/Heme/Allergies:  Does not bruise/bleed easily.  Psychiatric/Behavioral:  Negative for depression. The patient is not nervous/anxious and does not have insomnia.      MEDICAL HISTORY:  Past Medical History:  Diagnosis Date  . Arthritis   . Chronic low back pain with sciatica   . CKD (chronic kidney disease)    unknown stage  . Diabetes mellitus without complication (HCC)    insulin pump therapy  . Hemorrhoids   . History of pancreatitis   . Hyperlipidemia   . Hypertension   . 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 No pertinent surgical history.  SOCIAL HISTORY: Social History   Socioeconomic History  . Marital status: Single    Spouse name: Not on file  . Number of children: Not on file  . Years of education: Not on file  . Highest education level: Not on file  Occupational History  . Occupation: retired     Comment: custodian   Tobacco Use  . Smoking status: Some Days  Types: Cigarettes  . Smokeless tobacco: Never  . Tobacco comments:    4 cigarettes yesterday   Vaping Use  . Vaping Use: Never used  Substance and Sexual Activity  . Alcohol use: Yes    Comment: hx of ETOH abuse  . Drug use: Yes    Types: Cocaine, Marijuana    Comment: had cocaine Sat. (2 days ago)  . Sexual activity: Not on file  Other Topics Concern  . Not on file  Social History  Narrative   Lives with S.O. [name]    Social Determinants of Health   Financial Resource Strain: Not on file  Food Insecurity: Not on file  Transportation Needs: Not on file  Physical Activity: Not on file  Stress: Not on file  Social Connections: Not on file  Intimate Partner Violence: Not on file    FAMILY HISTORY: Family History  Problem Relation Age of Onset  . Diabetes Mother   . Hypertension Mother   . Heart failure Mother   . Hyperlipidemia Mother   . Thyroid disease Mother   . Alcohol abuse Father   . Emphysema Father   . Kidney failure Father   . Gout Father   . Cancer Sister   . Hyperlipidemia Sister   . Hypertension Sister   . Rheum arthritis Sister   . Fibromyalgia Sister   . Thyroid disease Sister     ALLERGIES:  has No Known Allergies.  MEDICATIONS:  Current Outpatient Medications  Medication Sig Dispense Refill  . amLODipine (NORVASC) 10 MG tablet Take 0.5 tablets by mouth daily.    Marland Kitchen aspirin 81 MG EC tablet Take 1 tablet by mouth daily. With a meal    . Brinzolamide-Brimonidine 1-0.2 % SUSP Place 1 drop into both eyes in the morning, at noon, and at bedtime.    . Cholecalciferol 25 MCG (1000 UT) tablet Take 2 tablets by mouth daily.    . dasatinib (SPRYCEL) 100 MG tablet Take 1 tablet (100 mg total) by mouth daily. 30 tablet 3  . fluticasone (FLONASE) 50 MCG/ACT nasal spray INSTILL 1 SPRAY INTO EACH NOSTRIL EVERY DAY AS NEEDED MAXIMUM 2 SPRAYS IN EACH NOSTRIL DAILY. FOR ALLERGIES    . guaiFENesin (MUCINEX) 600 MG 12 hr tablet TAKE ONE TABLET BY MOUTH TWO TIMES A DAY AS NEEDED FOR SINUS CONGESTION    . insulin aspart (NOVOLOG) 100 UNIT/ML injection Inject 100 Units into the skin continuous. INJECT 100 UNITS UNDER SKIN CONTINUOUS-VIA-PUMP AS DIRECTED FOR DIABETES.DISCARD BOTTLE 28 DAYS AFTER OPENING    . insulin glargine (LANTUS) 100 UNIT/ML injection     . latanoprost (XALATAN) 0.005 % ophthalmic solution Place 1 drop into both eyes at bedtime.     . lipase/protease/amylase (CREON) 36000 UNITS CPEP capsule See admin instructions. TAKE 2 CAPSULES BY MOUTH WITH ACTIVE MEALS AND TAKE 1 CAPSULE WITH SNACK    . lisinopril-hydrochlorothiazide (ZESTORETIC) 20-12.5 MG tablet Take 1 tablet by mouth daily.    . methocarbamol (ROBAXIN) 500 MG tablet Take 1 tablet (500 mg total) by mouth every 8 (eight) hours as needed for muscle spasms. 90 tablet 1  . nicotine (NICODERM CQ - DOSED IN MG/24 HOURS) 14 mg/24hr patch Place 14 mg onto the skin daily. APPLY ONE PATCH TO SKIN EVERY DAY ACTIVE APPLY ONE PATCH WHEN YOU WAKE UP AND REMOVE THE OLD PATCH. PEEL THE BACK OFF THE PATCH AND PUT IT ON CLEAN,DRY, HAIR-FREE SKIN ON THE UPPER ARM, CHEST OR BACK    . nicotine polacrilex (  COMMIT) 4 MG lozenge Take 4 mg by mouth as needed. DISSOLVE 1 LOZENGE BY MOUTH AS DIRECTED - USE AT LEAST 8-10 LOZENGES EACH DAY TO START. DO NOT USE MORE THAN 20 LOZENGES    . pregabalin (LYRICA) 50 MG capsule Take 1 capsule (50 mg total) by mouth 3 (three) times daily. 90 capsule 1  . rosuvastatin (CRESTOR) 20 MG tablet Take 10 mg by mouth daily.    . sodium chloride (OCEAN) 0.65 % nasal spray SPRAY 2 SPRAYS INTO EACH NOSTRIL FOUR TIMES A DAY FOR SINUS CONGESTION    . timolol (TIMOPTIC) 0.5 % ophthalmic solution Place 1 drop into both eyes 2 (two) times daily.     No current facility-administered medications for this visit.      Marland Kitchen  PHYSICAL EXAMINATION: ECOG PERFORMANCE STATUS: 0 - Asymptomatic  Vitals:   07/08/22 1518  BP: 130/65  Pulse: 69  Resp: 16  Temp: 98 F (36.7 C)  SpO2: 99%   Filed Weights   07/08/22 1518  Weight: 171 lb (77.6 kg)    Physical Exam Constitutional:      Comments: Patient ambulating independently.  He is accompanied by wife/significant other.  HENT:     Head: Normocephalic and atraumatic.     Mouth/Throat:     Pharynx: No oropharyngeal exudate.  Eyes:     Pupils: Pupils are equal, round, and reactive to light.  Cardiovascular:      Rate and Rhythm: Normal rate and regular rhythm.  Pulmonary:     Effort: No respiratory distress.     Breath sounds: No wheezing.     Comments: Decreased air entry bilaterally. Abdominal:     General: Bowel sounds are normal. There is no distension.     Palpations: Abdomen is soft. There is no mass.     Tenderness: There is no abdominal tenderness. There is no guarding or rebound.  Musculoskeletal:        General: No tenderness. Normal range of motion.     Cervical back: Normal range of motion and neck supple.  Skin:    General: Skin is warm.  Neurological:     Mental Status: He is alert and oriented to person, place, and time.  Psychiatric:        Mood and Affect: Affect normal.     LABORATORY DATA:  I have reviewed the data as listed Lab Results  Component Value Date   WBC 6.7 07/08/2022   HGB 13.1 07/08/2022   HCT 38.9 (L) 07/08/2022   MCV 91.5 07/08/2022   PLT 174 07/08/2022   Recent Labs    02/12/22 1651 05/06/22 1002 07/08/22 1447  NA 135 137 134*  K 3.4* 3.9 4.3  CL 106 109 102  CO2 _0 GLUCOSE 103* 268* 248*  BUN _1 CREATININE 1.18 1.17 1.32*  CALCIUM 9.0 8.3* 9.4  GFRNONAA >60 >60 >60  PROT 7.9 7.5 7.7  ALBUMIN 4.3 4.0 3.9  AST 38 27 27  ALT _2 ALKPHOS 59 62 57  BILITOT 0.5 0.4 0.2*    RADIOGRAPHIC STUDIES: I have personally reviewed the radiological images as listed and agreed with the findings in the report. No results found.  ASSESSMENT & PLAN:   Chronic myeloid leukemia ([place]) #Chronic myeloid leukemia- on dasatinib 100 mg/day [1st week of April, 2022].  Tolerating well-except for diarrhea see below  # Today white count 4.3 hemoglobin 14; platelets normal. JUNE 2023-BCR-ABL- fusion transcripts PCR-POSITIVE. ? Drug interaction [  recommend spacing out TUMS;] awaiting repeat RT-PCR today.  Discussed with [facility] pharmacist regarding checking on drug drug interaction.  #Chronic diarrhea- sec to Dasatinib- recommend using  Imodium up to 3 times a day if not improved will cut down dose of Dasatinib.  Stable.  #Mild anemia/CKD--hemoglobin 13.8- saturation 10%; continued dietary [liver]--stable  #Weight loss-patient admits to poor appetite; mild to moderate diarrhea [patient denies it is from Dasatinib]-recommend better control of diarrhea see above.  #Reflux-recommend holding off PPIs-given the interaction with dasatinib.  Continue Tums as needed-however space out evening dose.Marland Kitchen  STABLE.   #Diabetes-poorly controlled; currently on insulin pump- BG- 248; STABLE.  [[facility]].   # DISPOSITION: afternoon appt- monday # follow up in 2  months-MD; 2 weeks PRIOR labs- cbc/cmp/magnesium; Bcr-ABL RT-PCR; bcr-abl FISH panel-- Dr.B   All questions were answered. The patient knows to call the clinic with any problems, questions or concerns.     Cammie Sickle, MD 07/08/2022 3:46 PM

## 2022-07-09 ENCOUNTER — Telehealth: Payer: Self-pay | Admitting: Pharmacist

## 2022-07-09 DIAGNOSIS — R143 Flatulence: Secondary | ICD-10-CM

## 2022-07-09 NOTE — Telephone Encounter (Signed)
Oral Chemotherapy Pharmacist Encounter   Called patient to discuss potential DDIs with dasatinib. Reviewed patients med list prior to call and no DDI with dasatinib that would effect the concentration of the dasatinib.   During call, patient stated he was taking Tums as needed. This was added to his medication list. There is a DDI between dasatinib and calcium carbonate (Tums). Antacids may decrease the serum concentration of dasatinib.  Antacids should be taken at a minimum of 2 hours before or 2 hours after dasatinib.  Reviewed the above information with patient and he stated his understanding.    Darl Pikes, PharmD, BCPS, BCOP, CPP Hematology/Oncology Clinical Pharmacist [place]/DB/AP [facility] [phone]  07/09/2022 2:57 PM

## 2022-07-14 LAB — BCR-ABL1, CML/ALL, PCR, QUANT: Interpretation (BCRAL):: NEGATIVE

## 2022-07-15 ENCOUNTER — Other Ambulatory Visit (HOSPITAL_COMMUNITY): Payer: Self-pay

## 2022-07-16 ENCOUNTER — Other Ambulatory Visit (HOSPITAL_COMMUNITY): Payer: Self-pay

## 2022-08-13 ENCOUNTER — Other Ambulatory Visit (HOSPITAL_COMMUNITY): Payer: Self-pay

## 2022-08-20 IMAGING — CT CT BIOPSY AND ASPIRATION BONE MARROW
1 of 2 series · 10 of 14 positions shown, 13 images · non-contrast
Comparison: none

INDICATION: Chronic myeloid leukemia. Please perform CT-guided bone marrow
biopsy for tissue diagnostic purposes.

[Series 2: i-spiral 5.0 b30f · axial · 0.73mm/px · z∈[-170,-79]mm · 10 of 32 slices shown, 13 images]
[im 3/32  soft-tissue]
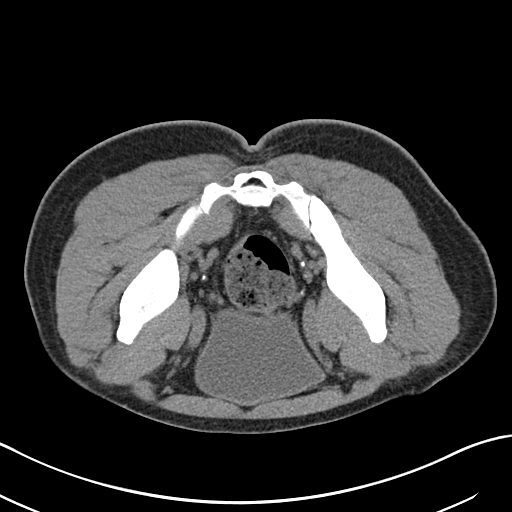
[im 3/32  bone]
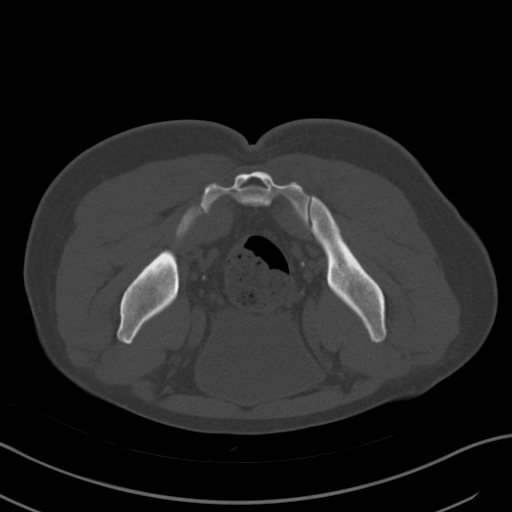
[im 6/32  bone]
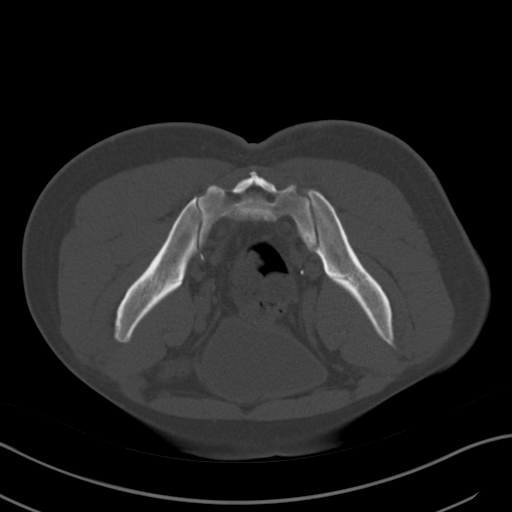
[im 9/32  bone]
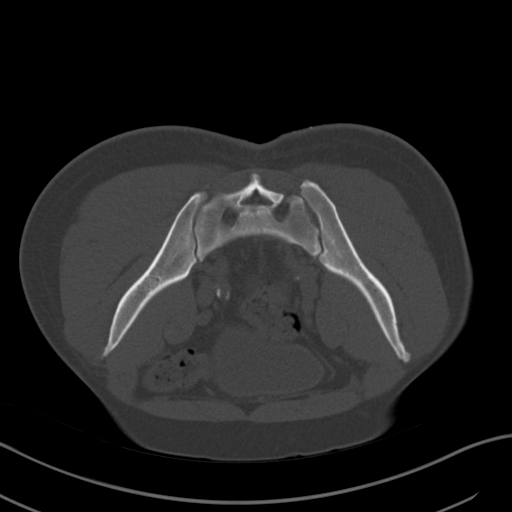
[im 12/32  bone]
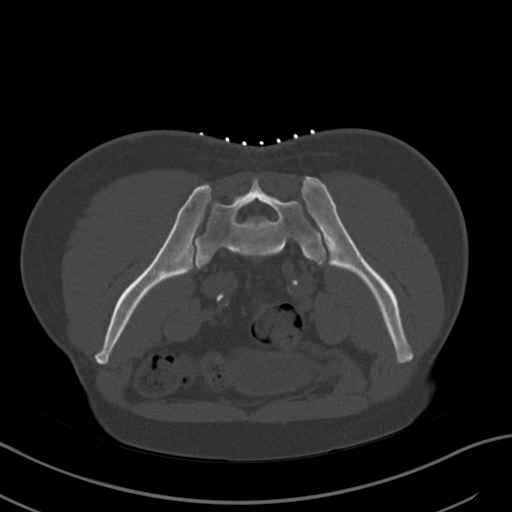
[im 15/32  soft-tissue]
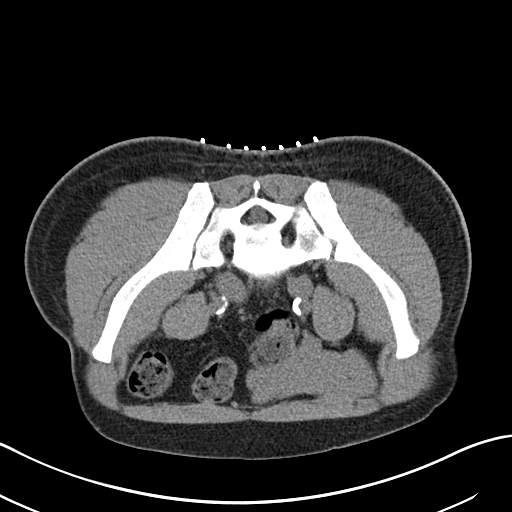
[im 15/32  bone]
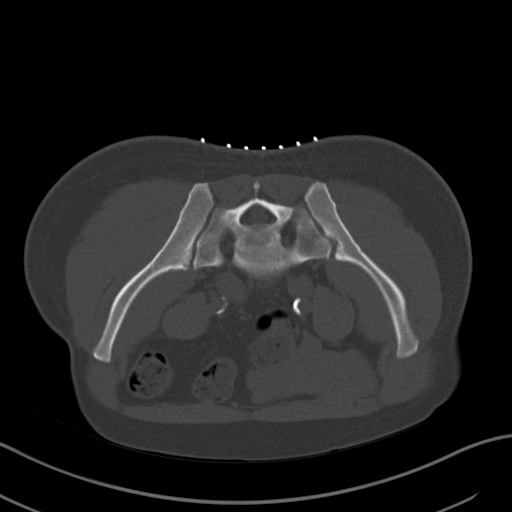
[im 17/32  bone]
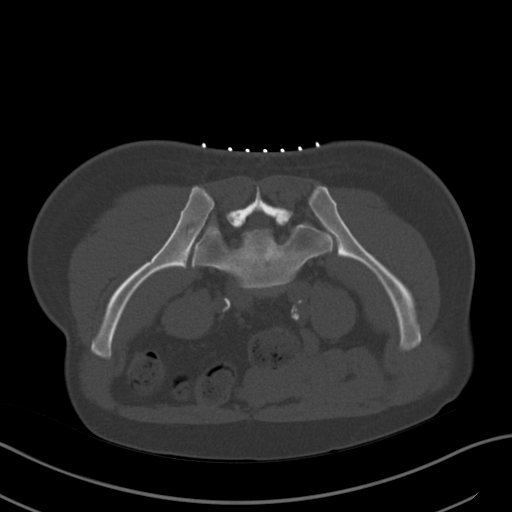
[im 20/32  bone]
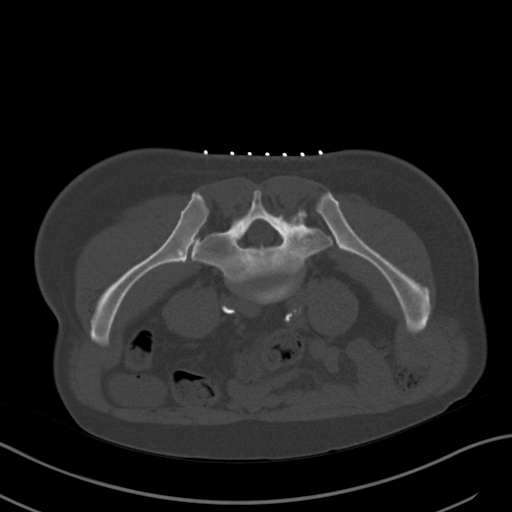
[im 23/32  bone]
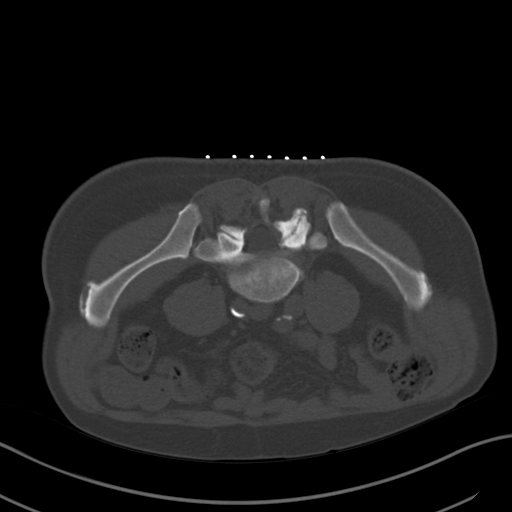
[im 26/32  soft-tissue]
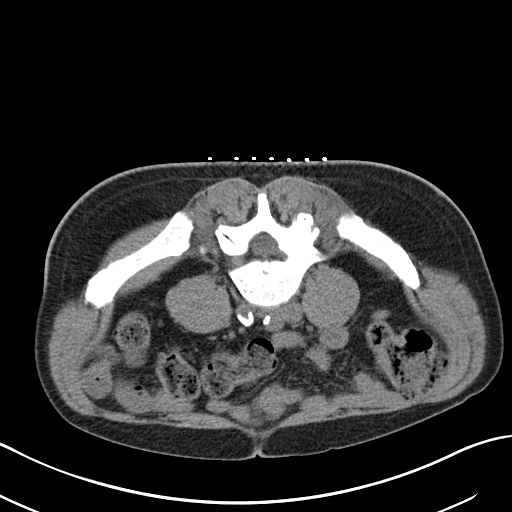
[im 26/32  bone]
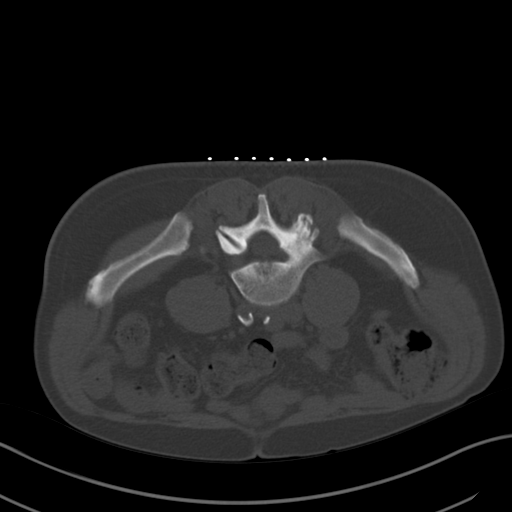
[im 29/32  bone]
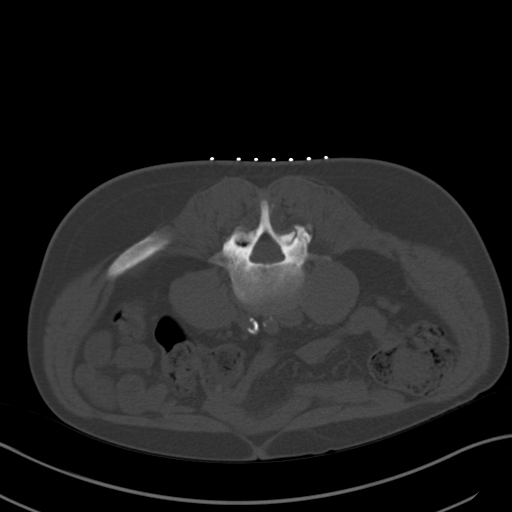

[10 of 14 positions shown; findings below may reference images not displayed]

EXAM:
CT-GUIDED BONE MARROW BIOPSY AND ASPIRATION

MEDICATIONS:
None

ANESTHESIA/SEDATION:
Fentanyl 100 mcg IV; Versed 2 mg IV

Sedation Time: 10 Minutes; The patient was continuously monitored
during the procedure by the interventional radiology nurse under my
direct supervision.

COMPLICATIONS:
None immediate.

PROCEDURE:
Informed consent was obtained from the patient following an
explanation of the procedure, risks, benefits and alternatives. The
patient understands, agrees and consents for the procedure. All
questions were addressed. A time out was performed prior to the
initiation of the procedure.

The patient was positioned prone and non-contrast localization CT
was performed of the pelvis to demonstrate the iliac marrow spaces.
The operative site was prepped and draped in the usual sterile
fashion.

Under sterile conditions and local anesthesia, a 22 gauge spinal
needle was utilized for procedural planning. Next, an 11 gauge
coaxial bone biopsy needle was advanced into the left iliac marrow
space. Needle position was confirmed with CT imaging. Initially, a
bone marrow aspiration was performed. Next, a bone marrow biopsy was
obtained with the 11 gauge outer bone marrow device. The needle was
removed and superficial hemostasis was obtained with manual
compression. A dressing was applied. The patient tolerated the
procedure well without immediate post procedural complication.
IMPRESSION: Successful CT guided left iliac bone marrow aspiration and core
biopsy.

## 2022-08-22 ENCOUNTER — Other Ambulatory Visit (HOSPITAL_COMMUNITY): Payer: Self-pay

## 2022-09-09 ENCOUNTER — Inpatient Hospital Stay: Payer: Medicare Other | Attending: Internal Medicine

## 2022-09-09 DIAGNOSIS — C9210 Chronic myeloid leukemia, BCR/ABL-positive, not having achieved remission: Secondary | ICD-10-CM | POA: Insufficient documentation

## 2022-09-11 ENCOUNTER — Other Ambulatory Visit (HOSPITAL_COMMUNITY): Payer: Self-pay

## 2022-09-16 ENCOUNTER — Other Ambulatory Visit (HOSPITAL_COMMUNITY): Payer: Self-pay

## 2022-09-18 ENCOUNTER — Other Ambulatory Visit (HOSPITAL_COMMUNITY): Payer: Self-pay

## 2022-09-20 ENCOUNTER — Inpatient Hospital Stay: Payer: Medicare Other

## 2022-09-20 DIAGNOSIS — C9210 Chronic myeloid leukemia, BCR/ABL-positive, not having achieved remission: Secondary | ICD-10-CM | POA: Diagnosis present

## 2022-09-20 LAB — CBC WITH DIFFERENTIAL/PLATELET
Abs Immature Granulocytes: 0.01 10*3/uL (ref 0.00–0.07)
Basophils Absolute: 0 10*3/uL (ref 0.0–0.1)
Basophils Relative: 0 %
Eosinophils Absolute: 0.3 10*3/uL (ref 0.0–0.5)
Eosinophils Relative: 7 %
HCT: 41.2 % (ref 39.0–52.0)
Hemoglobin: 13.7 g/dL (ref 13.0–17.0)
Immature Granulocytes: 0 %
Lymphocytes Relative: 18 %
Lymphs Abs: 0.8 10*3/uL (ref 0.7–4.0)
MCH: 31.6 pg (ref 26.0–34.0)
MCHC: 33.3 g/dL (ref 30.0–36.0)
MCV: 94.9 fL (ref 80.0–100.0)
Monocytes Absolute: 0.6 10*3/uL (ref 0.1–1.0)
Monocytes Relative: 13 %
Neutro Abs: 2.9 10*3/uL (ref 1.7–7.7)
Neutrophils Relative %: 62 %
Platelets: 193 10*3/uL (ref 150–400)
RBC: 4.34 MIL/uL (ref 4.22–5.81)
RDW: 13.9 % (ref 11.5–15.5)
WBC: 4.7 10*3/uL (ref 4.0–10.5)
nRBC: 0 % (ref 0.0–0.2)

## 2022-09-20 LAB — COMPREHENSIVE METABOLIC PANEL
ALT: 18 U/L (ref 0–44)
AST: 29 U/L (ref 15–41)
Albumin: 4 g/dL (ref 3.5–5.0)
Alkaline Phosphatase: 62 U/L (ref 38–126)
Anion gap: 9 (ref 5–15)
BUN: 22 mg/dL (ref 8–23)
CO2: 19 mmol/L — ABNORMAL LOW (ref 22–32)
Calcium: 8.6 mg/dL — ABNORMAL LOW (ref 8.9–10.3)
Chloride: 108 mmol/L (ref 98–111)
Creatinine, Ser: 1.42 mg/dL — ABNORMAL HIGH (ref 0.61–1.24)
GFR, Estimated: 56 mL/min — ABNORMAL LOW (ref >60)
Glucose, Bld: 335 mg/dL — ABNORMAL HIGH (ref 70–99)
Potassium: 4.1 mmol/L (ref 3.5–5.1)
Sodium: 136 mmol/L (ref 135–145)
Total Bilirubin: 0.2 mg/dL — ABNORMAL LOW (ref 0.3–1.2)
Total Protein: 7.9 g/dL (ref 6.5–8.1)

## 2022-09-20 LAB — MAGNESIUM: Magnesium: 2 mg/dL (ref 1.7–2.4)

## 2022-09-23 ENCOUNTER — Inpatient Hospital Stay: Payer: Medicare Other | Admitting: Internal Medicine

## 2022-09-23 NOTE — Assessment & Plan Note (Deleted)
#  Chronic myeloid leukemia- on dasatinib 100 mg/day [1st week of April, 2022].  Tolerating well-except for diarrhea see below  # Today white count 4.3 hemoglobin 14; platelets normal. JUNE 2023-BCR-ABL- fusion transcripts PCR-POSITIVE. ? Drug interaction [recommend spacing out TUMS;] awaiting repeat RT-PCR today.  Discussed with [facility] pharmacist regarding checking on drug drug interaction.  #Chronic diarrhea- sec to Dasatinib- recommend using Imodium up to 3 times a day if not improved will cut down dose of Dasatinib.  Stable.  #Mild anemia/CKD--hemoglobin 13.8- saturation 10%; continued dietary [liver]--stable  #Weight loss-patient admits to poor appetite; mild to moderate diarrhea [patient denies it is from Dasatinib]-recommend better control of diarrhea see above.  #Reflux-recommend holding off PPIs-given the interaction with dasatinib.  Continue Tums as needed-however space out evening dose.Marland Kitchen  STABLE.   #Diabetes-poorly controlled; currently on insulin pump- BG- 248; STABLE.  [VA].   # DISPOSITION: afternoon appt- monday # follow up in 2  months-MD; 2 weeks PRIOR labs- cbc/cmp/magnesium; Bcr-ABL RT-PCR; bcr-abl FISH panel-- Dr.B

## 2022-09-24 LAB — BCR-ABL1 FISH
Cells Analyzed: 200
Cells Counted: 200

## 2022-09-27 ENCOUNTER — Inpatient Hospital Stay: Payer: Medicare Other | Attending: Internal Medicine | Admitting: Internal Medicine

## 2022-09-27 ENCOUNTER — Encounter: Payer: Self-pay | Admitting: Internal Medicine

## 2022-09-27 VITALS — BP 140/63 | HR 78 | Temp 96.6°F | Resp 20 | Wt 164.7 lb

## 2022-09-27 DIAGNOSIS — D631 Anemia in chronic kidney disease: Secondary | ICD-10-CM | POA: Diagnosis not present

## 2022-09-27 DIAGNOSIS — E1165 Type 2 diabetes mellitus with hyperglycemia: Secondary | ICD-10-CM | POA: Diagnosis not present

## 2022-09-27 DIAGNOSIS — C9210 Chronic myeloid leukemia, BCR/ABL-positive, not having achieved remission: Secondary | ICD-10-CM | POA: Diagnosis not present

## 2022-09-27 DIAGNOSIS — N183 Chronic kidney disease, stage 3 unspecified: Secondary | ICD-10-CM | POA: Diagnosis not present

## 2022-09-27 DIAGNOSIS — K219 Gastro-esophageal reflux disease without esophagitis: Secondary | ICD-10-CM | POA: Diagnosis not present

## 2022-09-27 DIAGNOSIS — E1122 Type 2 diabetes mellitus with diabetic chronic kidney disease: Secondary | ICD-10-CM | POA: Insufficient documentation

## 2022-09-27 DIAGNOSIS — K529 Noninfective gastroenteritis and colitis, unspecified: Secondary | ICD-10-CM | POA: Insufficient documentation

## 2022-09-27 MED ORDER — ONDANSETRON HCL 8 MG PO TABS: ORAL_TABLET | ORAL | 3 refills | Status: DC

## 2022-09-27 NOTE — Assessment & Plan Note (Addendum)
#  Chronic myeloid leukemia- on dasatinib 100 mg/day [1st week of April, 2022].  Tolerating well-except for diarrhea see below  # Today white count 4.7 hemoglobin 13.7; platelets normal. JUNE 2023-BCR-ABL- fusion transcripts PCR-POSITIVE. ? Drug interaction [recommend spacing out TUMS;] AUG 2023- NEGATIVE for bcr-abl. Awaiting repeat RT-PCR today.  FISH bcr-abl- negative. We will repeat in 3 months again.  #Chronic diarrhea- sec to Dasatinib- recommend using Imodium up to 3 times a day if not improved will cut down dose of Dasatinib. STABLE.   #Ongoing nausea-recommend taking Zofran 30 minutes prior to taking the therapy.  #Mild anemia/CKD--hemoglobin 13.8- saturation 10%; continued dietary [liver]--stable  #Weight loss-patient admits to poor appetite; mild to moderate diarrhea [patient denies it is from Dasatinib]-recommend better control of diarrhea see above.  #Reflux-recommend holding off PPIs-given the interaction with dasatinib.  Continue Tums as needed-however space out evening dose.   STABLE.   #Diabetes-poorly controlled; currently on insulin pump- BG- 339 STABLE.  [VA].   # CKD- stage III- GFR 57- STABLE.   # DISPOSITION:  # follow up in 3  months-MD; 2 weeks PRIOR labs- cbc/cmp/magnesium; Bcr-ABL RT-PCR; bcr-abl FISH panel-- Dr.B

## 2022-09-27 NOTE — Progress Notes (Signed)
[facility] CONSULT NOTE  Patient Care Team: System, Provider Not In as PCP - General Cammie Sickle, MD as Consulting Physician (Oncology)  CHIEF COMPLAINTS/PURPOSE OF CONSULTATION: CML  #  Oncology History Overview Note  Result Comment:   Comment: (NOTE)  ABNORMAL: 89% OF NUCLEI POSITIVE FOR BCR/ABL1 GENE FUSION  SIGNAL(S)     # MARCH 2022--  Hypercellular bone marrow with involvement by chronic myeloid  leukemia, BCR-ABL1-positive . he bone marrow is hypercellular for age with myeloid and megakaryocytic  hyperplasia.  There is no increase in blasts by manual differential count of aspirate smears (<1%) or by CD34 immunohistochemistry on the core biopsy.  A reticulin special stain reveals no significant increase  in reticulin fibrosis (MF grade 0 of 3).  # April 1st week -DASATINIB 100 mg; July 2022-FISH BCR ABL-negative for 9:22 abnormality [optimal; reached 24-monthmilestone. MMR at 10 months [gaol at 173]  #hypertension /alcoholic liver/ type 2 diabetes on insulin poorly controlled chronic back pain    Chronic myeloid leukemia ([place]  01/31/2021 Initial Diagnosis   Chronic myeloid leukemia ([facility]      HISTORY OF PRESENTING ILLNESS: Ambulating independently.  Accompanied by his wife.  EJosefine [id number].[id number].  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  Patient states his nausea is still the same and the medication prescribe is not working. Patient states he would like something to help with his appetite, states "I'm tired of losing weight.   He admits to compliance with his medications.  Patient complains of diarrhea continue stools a day.  He has not been taking any Imodium or any other antidiarrhea. Patient concerned about nausea.   Is concerned about weight loss.  No fever no chills.  No abdominal pain.  Admits to taking his pills on time/daily.   Review of Systems  Constitutional:  Positive for  malaise/fatigue. Negative for chills, diaphoresis, fever and weight loss.  HENT:  Negative for nosebleeds and sore throat.   Eyes:  Negative for double vision.  Respiratory:  Negative for cough, hemoptysis, sputum production, shortness of breath and wheezing.   Cardiovascular:  Negative for chest pain, palpitations, orthopnea and leg swelling.  Gastrointestinal:  Negative for abdominal pain, blood in stool, constipation, diarrhea, heartburn, melena, nausea and vomiting.  Genitourinary:  Negative for dysuria, frequency and urgency.  Musculoskeletal:  Positive for back pain and joint pain.  Skin: Negative.  Negative for itching and rash.  Neurological:  Negative for dizziness, tingling, focal weakness, weakness and headaches.  Endo/Heme/Allergies:  Does not bruise/bleed easily.  Psychiatric/Behavioral:  Negative for depression. The patient is not nervous/anxious and does not have insomnia.      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 No pertinent surgical history.  SOCIAL HISTORY: Social History   Socioeconomic History   Marital status: Single    Spouse name: Not on file   Number of children: Not on file   Years of education: Not on file   Highest education level: Not on file  Occupational History   Occupation: retired     Comment: custodian   Tobacco Use   Smoking status: Some Days    Types: Cigarettes   Smokeless tobacco: Never   Tobacco comments:    4 cigarettes yesterday   Vaping Use   Vaping Use: Never used  Substance and Sexual Activity   Alcohol use: Yes    Comment: hx of ETOH abuse   Drug use: Yes    Types: Cocaine,  Marijuana    Comment: had cocaine Sat. (2 days ago)   Sexual activity: Not on file  Other Topics Concern   Not on file  Social History Narrative   Lives with S.O. [name]    Social Determinants of Health   Financial Resource Strain: Not on file  Food Insecurity: Not on file  Transportation Needs: Not on file  Physical Activity: Not on file  Stress: Not on file  Social Connections: Not on file  Intimate Partner Violence: Not on file    FAMILY HISTORY: Family History  Problem Relation Age of Onset   Diabetes Mother    Hypertension Mother    Heart failure Mother    Hyperlipidemia Mother    Thyroid disease Mother    Alcohol abuse Father    Emphysema Father    Kidney failure Father    Gout Father    Cancer Sister    Hyperlipidemia Sister    Hypertension Sister    Rheum arthritis Sister    Fibromyalgia Sister    Thyroid disease Sister     ALLERGIES:  has No Known Allergies.  MEDICATIONS:  Current Outpatient Medications  Medication Sig Dispense Refill   amLODipine (NORVASC) 10 MG tablet Take 0.5 tablets by mouth daily.     aspirin 81 MG EC tablet Take 1 tablet by mouth daily. With a meal     Brinzolamide-Brimonidine 1-0.2 % SUSP Place 1 drop into both eyes in the morning, at noon, and at bedtime.     calcium carbonate (TUMS - DOSED IN MG ELEMENTAL CALCIUM) 500 MG chewable tablet Chew 1 tablet by mouth as needed.     Cholecalciferol 25 MCG (1000 UT) tablet Take 2 tablets by mouth daily.     dasatinib (SPRYCEL) 100 MG tablet Take 1 tablet (100 mg total) by mouth daily. 30 tablet 3   fluticasone (FLONASE) 50 MCG/ACT nasal spray INSTILL 1 SPRAY INTO EACH NOSTRIL EVERY DAY AS NEEDED MAXIMUM 2 SPRAYS IN EACH NOSTRIL DAILY. FOR ALLERGIES     guaiFENesin (MUCINEX) 600 MG 12 hr tablet TAKE ONE TABLET BY MOUTH TWO TIMES A DAY AS NEEDED FOR SINUS CONGESTION     insulin aspart (NOVOLOG) 100 UNIT/ML injection Inject 100 Units into the skin continuous. INJECT 100 UNITS UNDER SKIN  CONTINUOUS-VIA-PUMP AS DIRECTED FOR DIABETES.DISCARD BOTTLE 28 DAYS AFTER OPENING     insulin glargine (LANTUS) 100 UNIT/ML injection      latanoprost (XALATAN) 0.005 % ophthalmic solution Place 1 drop into both eyes at bedtime.     lipase/protease/amylase (CREON) 36000 UNITS CPEP capsule See admin instructions. TAKE 2 CAPSULES BY MOUTH WITH ACTIVE MEALS AND TAKE 1 CAPSULE WITH SNACK     lisinopril-hydrochlorothiazide (ZESTORETIC) 20-12.5 MG tablet Take 1 tablet by mouth daily.     methocarbamol (ROBAXIN) 500 MG tablet Take 1 tablet (500 mg total) by mouth every 8 (eight) hours as needed for muscle spasms. 90 tablet 1   nicotine (NICODERM CQ - DOSED IN MG/24 HOURS) 14 mg/24hr patch Place 14 mg onto  the skin daily. APPLY ONE PATCH TO SKIN EVERY DAY ACTIVE APPLY ONE PATCH WHEN YOU WAKE UP AND REMOVE THE OLD PATCH. PEEL THE BACK OFF THE PATCH AND PUT IT ON CLEAN,DRY, HAIR-FREE SKIN ON THE UPPER ARM, CHEST OR BACK     nicotine polacrilex (COMMIT) 4 MG lozenge Take 4 mg by mouth as needed. DISSOLVE 1 LOZENGE BY MOUTH AS DIRECTED - USE AT LEAST 8-10 LOZENGES EACH DAY TO START. DO NOT USE MORE THAN 20 LOZENGES     ondansetron (ZOFRAN) 8 MG tablet One pill 30-45 mins prior to taking your chemo pill to prevent nausea. 30 tablet 3   pregabalin (LYRICA) 50 MG capsule Take 1 capsule (50 mg total) by mouth 3 (three) times daily. 90 capsule 1   rosuvastatin (CRESTOR) 20 MG tablet Take 10 mg by mouth daily.     sodium chloride (OCEAN) 0.65 % nasal spray SPRAY 2 SPRAYS INTO EACH NOSTRIL FOUR TIMES A DAY FOR SINUS CONGESTION     timolol (TIMOPTIC) 0.5 % ophthalmic solution Place 1 drop into both eyes 2 (two) times daily.     No current facility-administered medications for this visit.      Marland Kitchen  PHYSICAL EXAMINATION: ECOG PERFORMANCE STATUS: 0 - Asymptomatic  Vitals:   09/27/22 0856  BP: (!) 140/63  Pulse: 78  Resp: 20  Temp: (!) 96.6 F (35.9 C)  SpO2: 100%   Filed Weights   09/27/22 0856   Weight: 164 lb 11.2 oz (74.7 kg)    Physical Exam Constitutional:      Comments: Patient ambulating independently.  He is accompanied by wife/significant other.  HENT:     Head: Normocephalic and atraumatic.     Mouth/Throat:     Pharynx: No oropharyngeal exudate.  Eyes:     Pupils: Pupils are equal, round, and reactive to light.  Cardiovascular:     Rate and Rhythm: Normal rate and regular rhythm.  Pulmonary:     Effort: No respiratory distress.     Breath sounds: No wheezing.     Comments: Decreased air entry bilaterally. Abdominal:     General: Bowel sounds are normal. There is no distension.     Palpations: Abdomen is soft. There is no mass.     Tenderness: There is no abdominal tenderness. There is no guarding or rebound.  Musculoskeletal:        General: No tenderness. Normal range of motion.     Cervical back: Normal range of motion and neck supple.  Skin:    General: Skin is warm.  Neurological:     Mental Status: He is alert and oriented to person, place, and time.  Psychiatric:        Mood and Affect: Affect normal.      LABORATORY DATA:  I have reviewed the data as listed Lab Results  Component Value Date   WBC 4.7 09/20/2022   HGB 13.7 09/20/2022   HCT 41.2 09/20/2022   MCV 94.9 09/20/2022   PLT 193 09/20/2022   Recent Labs    05/06/22 1002 07/08/22 1447 09/20/22 0905  NA 137 134* 136  K 3.9 4.3 4.1  CL 109 102 108  CO2 23 22 19*  GLUCOSE 268* 248* 335*  BUN _0 CREATININE 1.17 1.32* 1.42*  CALCIUM 8.3* 9.4 8.6*  GFRNONAA >60 >60 56*  PROT 7.5 7.7 7.9  ALBUMIN 4.0 3.9 4.0  AST _1 ALT _2 ALKPHOS 62 57 62  BILITOT 0.4 0.2* 0.2*    RADIOGRAPHIC STUDIES: I have personally reviewed the radiological images as listed and agreed with the findings in the report. No results found.  ASSESSMENT & PLAN:   Chronic myeloid leukemia ([place]) #Chronic myeloid leukemia- on dasatinib 100 mg/day [1st week of April, 2022].   Tolerating well-except for diarrhea see below  # Today white count 4.7 hemoglobin 13.7; platelets normal. JUNE 2023-BCR-ABL- fusion transcripts PCR-POSITIVE. ? Drug interaction [recommend spacing out TUMS;] AUG 2023- NEGATIVE for bcr-abl. Awaiting repeat RT-PCR today.    #Chronic diarrhea- sec to Dasatinib- recommend using Imodium up to 3 times a day if not improved will cut down dose of Dasatinib. STABLE.   #Mild anemia/CKD--hemoglobin 13.8- saturation 10%; continued dietary [liver]--stable  #Weight loss-patient admits to poor appetite; mild to moderate diarrhea [patient denies it is from Dasatinib]-recommend better control of diarrhea see above.  #Reflux-recommend holding off PPIs-given the interaction with dasatinib.  Continue Tums as needed-however space out evening dose.   STABLE.   #Diabetes-poorly controlled; currently on insulin pump- BG- 339 STABLE.  [VA].   # CKD- stage III- GFR 57- STABLE.   # DISPOSITION:  # follow up in 3  months-MD; 2 weeks PRIOR labs- cbc/cmp/magnesium; Bcr-ABL RT-PCR; bcr-abl FISH panel-- Dr.B    All questions were answered. The patient knows to call the clinic with any problems, questions or concerns.     Cammie Sickle, MD 09/27/2022 11:30 AM

## 2022-09-27 NOTE — Progress Notes (Signed)
Patient states his nausea is still the same and the medication prescribe is not working. Patient states he would like something to help with his appetite, states "I'm tired of losing weight."

## 2022-09-29 LAB — BCR-ABL1, CML/ALL, PCR, QUANT: Interpretation (BCRAL):: NEGATIVE

## 2022-09-30 ENCOUNTER — Telehealth: Payer: Self-pay

## 2022-09-30 NOTE — Telephone Encounter (Signed)
-----   Message from Cammie Sickle, MD sent at 09/30/2022  2:36 AM EST ----- Nira Conn - please inform patient that his leukemia marker is negative.  This means his leukemia is in excellent control.  He will continue to take his medications as prescribed.  Follow-up as planned.  Thank you GB

## 2022-09-30 NOTE — Telephone Encounter (Signed)
Patient notified

## 2022-10-14 ENCOUNTER — Other Ambulatory Visit (HOSPITAL_COMMUNITY): Payer: Self-pay

## 2022-10-14 ENCOUNTER — Other Ambulatory Visit: Payer: Self-pay | Admitting: Pharmacist

## 2022-10-14 DIAGNOSIS — C9210 Chronic myeloid leukemia, BCR/ABL-positive, not having achieved remission: Secondary | ICD-10-CM

## 2022-10-14 MED ORDER — DASATINIB 100 MG PO TABS
100.0000 mg | ORAL_TABLET | Freq: Every day | ORAL | 3 refills | Status: DC
  Filled 2022-10-14: qty 30, 30d supply, fill #0
  Filled 2022-11-14: qty 30, 30d supply, fill #1
  Filled 2022-12-26: qty 30, 30d supply, fill #2

## 2022-10-18 ENCOUNTER — Other Ambulatory Visit (HOSPITAL_COMMUNITY): Payer: Self-pay

## 2022-10-24 ENCOUNTER — Other Ambulatory Visit (HOSPITAL_COMMUNITY): Payer: Self-pay

## 2022-11-05 ENCOUNTER — Telehealth: Payer: Self-pay | Admitting: *Deleted

## 2022-11-05 NOTE — Telephone Encounter (Signed)
Patient called requesting something for constipation. I asked what he has tried to date and he said he is drinking coffee and eating fruits. I advised that he get Miralax and take daily and to also get Senna over the counter and take one daily may increase to twice a day if needed. I asked that he let us know if this does not help. He agreed and repeated medications back to me.

## 2022-11-11 ENCOUNTER — Other Ambulatory Visit (HOSPITAL_COMMUNITY): Payer: Self-pay

## 2022-11-14 ENCOUNTER — Other Ambulatory Visit: Payer: Self-pay

## 2022-12-10 ENCOUNTER — Other Ambulatory Visit (HOSPITAL_COMMUNITY): Payer: Self-pay

## 2022-12-13 ENCOUNTER — Inpatient Hospital Stay: Payer: 59 | Attending: Internal Medicine

## 2022-12-13 DIAGNOSIS — C9210 Chronic myeloid leukemia, BCR/ABL-positive, not having achieved remission: Secondary | ICD-10-CM | POA: Insufficient documentation

## 2022-12-25 ENCOUNTER — Inpatient Hospital Stay: Payer: 59

## 2022-12-25 DIAGNOSIS — C9210 Chronic myeloid leukemia, BCR/ABL-positive, not having achieved remission: Secondary | ICD-10-CM | POA: Diagnosis not present

## 2022-12-25 LAB — COMPREHENSIVE METABOLIC PANEL
ALT: 16 U/L (ref 0–44)
AST: 20 U/L (ref 15–41)
Albumin: 4 g/dL (ref 3.5–5.0)
Alkaline Phosphatase: 67 U/L (ref 38–126)
Anion gap: 10 (ref 5–15)
BUN: 18 mg/dL (ref 8–23)
CO2: 21 mmol/L — ABNORMAL LOW (ref 22–32)
Calcium: 8.8 mg/dL — ABNORMAL LOW (ref 8.9–10.3)
Chloride: 100 mmol/L (ref 98–111)
Creatinine, Ser: 1.31 mg/dL — ABNORMAL HIGH (ref 0.61–1.24)
GFR, Estimated: >60 mL/min (ref >60)
Glucose, Bld: 245 mg/dL — ABNORMAL HIGH (ref 70–99)
Potassium: 4.2 mmol/L (ref 3.5–5.1)
Sodium: 131 mmol/L — ABNORMAL LOW (ref 135–145)
Total Bilirubin: 0.6 mg/dL (ref 0.3–1.2)
Total Protein: 7.7 g/dL (ref 6.5–8.1)

## 2022-12-25 LAB — CBC WITH DIFFERENTIAL/PLATELET
Abs Immature Granulocytes: 0.02 10*3/uL (ref 0.00–0.07)
Basophils Absolute: 0 10*3/uL (ref 0.0–0.1)
Basophils Relative: 0 %
Eosinophils Absolute: 0.5 10*3/uL (ref 0.0–0.5)
Eosinophils Relative: 7 %
HCT: 44 % (ref 39.0–52.0)
Hemoglobin: 14.7 g/dL (ref 13.0–17.0)
Immature Granulocytes: 0 %
Lymphocytes Relative: 17 %
Lymphs Abs: 1.2 10*3/uL (ref 0.7–4.0)
MCH: 30.8 pg (ref 26.0–34.0)
MCHC: 33.4 g/dL (ref 30.0–36.0)
MCV: 92.1 fL (ref 80.0–100.0)
Monocytes Absolute: 0.6 10*3/uL (ref 0.1–1.0)
Monocytes Relative: 9 %
Neutro Abs: 4.5 10*3/uL (ref 1.7–7.7)
Neutrophils Relative %: 67 %
Platelets: 188 10*3/uL (ref 150–400)
RBC: 4.78 MIL/uL (ref 4.22–5.81)
RDW: 13.2 % (ref 11.5–15.5)
WBC: 6.8 10*3/uL (ref 4.0–10.5)
nRBC: 0 % (ref 0.0–0.2)

## 2022-12-25 LAB — MAGNESIUM: Magnesium: 1.9 mg/dL (ref 1.7–2.4)

## 2022-12-26 ENCOUNTER — Other Ambulatory Visit (HOSPITAL_COMMUNITY): Payer: Self-pay

## 2022-12-27 ENCOUNTER — Inpatient Hospital Stay: Payer: 59 | Admitting: Internal Medicine

## 2022-12-30 LAB — BCR-ABL1 FISH
Cells Analyzed: 200
Cells Counted: 200

## 2023-01-01 LAB — BCR-ABL1, CML/ALL, PCR, QUANT: Interpretation (BCRAL):: NEGATIVE

## 2023-01-03 ENCOUNTER — Other Ambulatory Visit (HOSPITAL_COMMUNITY): Payer: Self-pay

## 2023-01-10 ENCOUNTER — Other Ambulatory Visit: Payer: Self-pay

## 2023-01-10 ENCOUNTER — Encounter: Payer: Self-pay | Admitting: Internal Medicine

## 2023-01-10 ENCOUNTER — Other Ambulatory Visit (HOSPITAL_COMMUNITY): Payer: Self-pay

## 2023-01-10 ENCOUNTER — Inpatient Hospital Stay: Payer: 59 | Attending: Internal Medicine | Admitting: Internal Medicine

## 2023-01-10 ENCOUNTER — Other Ambulatory Visit: Payer: Self-pay | Admitting: Pharmacist

## 2023-01-10 VITALS — BP 137/72 | HR 76 | Wt 169.6 lb

## 2023-01-10 DIAGNOSIS — I129 Hypertensive chronic kidney disease with stage 1 through stage 4 chronic kidney disease, or unspecified chronic kidney disease: Secondary | ICD-10-CM | POA: Diagnosis not present

## 2023-01-10 DIAGNOSIS — D631 Anemia in chronic kidney disease: Secondary | ICD-10-CM | POA: Insufficient documentation

## 2023-01-10 DIAGNOSIS — N183 Chronic kidney disease, stage 3 unspecified: Secondary | ICD-10-CM | POA: Insufficient documentation

## 2023-01-10 DIAGNOSIS — C9210 Chronic myeloid leukemia, BCR/ABL-positive, not having achieved remission: Secondary | ICD-10-CM | POA: Diagnosis not present

## 2023-01-10 DIAGNOSIS — E1122 Type 2 diabetes mellitus with diabetic chronic kidney disease: Secondary | ICD-10-CM | POA: Insufficient documentation

## 2023-01-10 DIAGNOSIS — K529 Noninfective gastroenteritis and colitis, unspecified: Secondary | ICD-10-CM | POA: Insufficient documentation

## 2023-01-10 MED ORDER — DASATINIB 70 MG PO TABS
70.0000 mg | ORAL_TABLET | Freq: Every day | ORAL | 2 refills | Status: DC
  Filled 2023-01-10: qty 30, 30d supply, fill #0
  Filled 2023-02-04: qty 30, 30d supply, fill #1
  Filled 2023-03-03: qty 30, 30d supply, fill #2

## 2023-01-10 MED ORDER — ONDANSETRON HCL 8 MG PO TABS: ORAL_TABLET | ORAL | 3 refills | Status: DC

## 2023-01-10 NOTE — Progress Notes (Signed)
Pt has had nausea all week. He is only taking 1/2 pill Sprycel, he can't handle the whole pill. Denies pain.

## 2023-01-10 NOTE — Assessment & Plan Note (Addendum)
#  Chronic myeloid leukemia- on dasatinib 100 mg/day [1st week of April, 2022].  Tolerating well-except for nausea see below. JAN 2024 NEGATIVE for bcr-abl. FISH bcr-abl- negative. We will repeat in 3 months again. CBC/CMP-wnl; except CKD-see below.   # Given ongoing nausea- post dasatinib; not improved with Anti-emetics-recommend Dasatinib 70 mg a day.  Discussed with pharmacy.  Patient will discontinue losartan 100 mg.  #Chronic diarrhea- sec to Dasatinib- r stable.  Imodium as needed.  #Mild anemia/CKD--hemoglobin 12.5 - saturation 10%; continued dietary [liver]--stable  #Weight loss-patient admits to poor appetite; mild to moderate diarrhea [patient denies it is from Dasatinib]-stable  #Reflux-recommend holding off PPIs-given the interaction with dasatinib.  Continue Tums as needed-however space out evening dose.   stable  #Diabetes-poorly controlled; currently on insulin pump- BG- 245  [VA]. stable  # CKD- stage III- GFR 57- stable.   # DISPOSITION:  # follow up in 3  months-MD; 2 weeks PRIOR labs- cbc/cmp/magnesium; Bcr-ABL RT-PCR; bcr-abl FISH panel; iron studies;ferritin; vit D 25-OH- Dr.B

## 2023-01-10 NOTE — Progress Notes (Signed)
[place] [facility] CONSULT NOTE  Patient Care Team: System, Provider Not In as PCP - General Cammie Sickle, MD as Consulting Physician (Oncology)  CHIEF COMPLAINTS/PURPOSE OF CONSULTATION: CML  #  Oncology History Overview Note  Result Comment:   Comment: (NOTE)  ABNORMAL: 89% OF NUCLEI POSITIVE FOR BCR/ABL1 GENE FUSION  SIGNAL(S)     # MARCH 2022--  Hypercellular bone marrow with involvement by chronic myeloid  leukemia, BCR-ABL1-positive . he bone marrow is hypercellular for age with myeloid and megakaryocytic  hyperplasia.  There is no increase in blasts by manual differential count of aspirate smears (<1%) or by CD34 immunohistochemistry on the core biopsy.  A reticulin special stain reveals no significant increase  in reticulin fibrosis (MF grade 0 of 3).  # April 1st week -DASATINIB 100 mg; July 2022-FISH BCR ABL-negative for 9:22 abnormality [optimal; reached 16-monthmilestone. MMR at 10 months [gaol at 135]   # FEB 2024-secondary to chronic nausea-decrease dose of dasatinib to 70 mg/day.    #hypertension /alcoholic liver/ type 2 diabetes on insulin poorly controlled chronic back pain    [place]  01/31/2021 Initial Diagnosis   Chronic myeloid leukemia (HCC)    HISTORY OF PRESENTING ILLNESS: Ambulating independently.  Accompanied by his wife.  EJosefine Class89y.o.  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  Pt has had nausea all week. He is only taking 1/2 pill Sprycel, he can't handle the whole pill. Denies pain.   Patient has been taking antiemetic prior to his Dasatinib pill.   Denies any worsening diarrhea or abdominal pain. No fever no chills.    Review of Systems  Constitutional:  Positive for malaise/fatigue. Negative for chills, diaphoresis, fever and weight loss.  HENT:  Negative for nosebleeds and sore throat.   Eyes:  Negative for double vision.  Respiratory:  Negative for  cough, hemoptysis, sputum production, shortness of breath and wheezing.   Cardiovascular:  Negative for chest pain, palpitations, orthopnea and leg swelling.  Gastrointestinal:  Negative for abdominal pain, blood in stool, constipation, diarrhea, heartburn, melena, nausea and vomiting.  Genitourinary:  Negative for dysuria, frequency and urgency.  Musculoskeletal:  Positive for back pain and joint pain.  Skin: Negative.  Negative for itching and rash.  Neurological:  Negative for dizziness, tingling, focal weakness, weakness and headaches.  Endo/Heme/Allergies:  Does not bruise/bleed easily.  Psychiatric/Behavioral:  Negative for depression. The patient is not nervous/anxious and does not have insomnia.      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 No pertinent surgical history.  SOCIAL HISTORY: Social History   Socioeconomic History   Marital status: Single    Spouse name: Not on file   Number of children: Not on file   Years of education: Not on file   Highest education level: Not on file  Occupational History   Occupation: retired     Comment: custodian   Tobacco Use   Smoking status: Some Days    Types: Cigarettes   Smokeless  tobacco: Never   Tobacco comments:    4 cigarettes yesterday   Vaping Use   Vaping Use: Never used  Substance and Sexual Activity   Alcohol use: Yes    Comment: hx of ETOH abuse   Drug use: Yes    Types: Cocaine, Marijuana    Comment: had cocaine Sat. (2 days ago)   Sexual activity: Not on file  Other Topics Concern   Not on file  Social History Narrative   Lives with S.O. [name]    Social  Determinants of Health   Financial Resource Strain: Not on file  Food Insecurity: Not on file  Transportation Needs: Not on file  Physical Activity: Not on file  Stress: Not on file  Social Connections: Not on file  Intimate Partner Violence: Not on file    FAMILY HISTORY: Family History  Problem Relation Age of Onset   Diabetes Mother    Hypertension Mother    Heart failure Mother    Hyperlipidemia Mother    Thyroid disease Mother    Alcohol abuse Father    Emphysema Father    Kidney failure Father    Gout Father    Cancer Sister    Hyperlipidemia Sister    Hypertension Sister    Rheum arthritis Sister    Fibromyalgia Sister    Thyroid disease Sister     ALLERGIES:  has No Known Allergies.  MEDICATIONS:  Current Outpatient Medications  Medication Sig Dispense Refill   amLODipine (NORVASC) 10 MG tablet Take 0.5 tablets by mouth daily.     aspirin 81 MG EC tablet Take 1 tablet by mouth daily. With a meal     Brinzolamide-Brimonidine 1-0.2 % SUSP Place 1 drop into both eyes in the morning, at noon, and at bedtime.     Cholecalciferol 25 MCG (1000 UT) tablet Take 2 tablets by mouth daily.     fluticasone (FLONASE) 50 MCG/ACT nasal spray INSTILL 1 SPRAY INTO EACH NOSTRIL EVERY DAY AS NEEDED MAXIMUM 2 SPRAYS IN EACH NOSTRIL DAILY. FOR ALLERGIES     guaiFENesin (MUCINEX) 600 MG 12 hr tablet TAKE ONE TABLET BY MOUTH TWO TIMES A DAY AS NEEDED FOR SINUS CONGESTION     insulin aspart (NOVOLOG) 100 UNIT/ML injection Inject 100 Units into the skin continuous. INJECT 100 UNITS UNDER SKIN CONTINUOUS-VIA-PUMP AS DIRECTED FOR DIABETES.DISCARD BOTTLE 28 DAYS AFTER OPENING     insulin glargine (LANTUS) 100 UNIT/ML injection      latanoprost (XALATAN) 0.005 % ophthalmic solution Place 1 drop into both eyes at bedtime.     lipase/protease/amylase (CREON) 36000 UNITS CPEP capsule See admin instructions. TAKE 2 CAPSULES BY MOUTH WITH ACTIVE MEALS AND TAKE 1 CAPSULE WITH SNACK      lisinopril-hydrochlorothiazide (ZESTORETIC) 20-12.5 MG tablet Take 1 tablet by mouth daily.     methocarbamol (ROBAXIN) 500 MG tablet Take 1 tablet (500 mg total) by mouth every 8 (eight) hours as needed for muscle spasms. 90 tablet 1   nicotine (NICODERM CQ - DOSED IN MG/24 HOURS) 14 mg/24hr patch Place 14 mg onto the skin daily. APPLY ONE PATCH TO SKIN EVERY DAY ACTIVE APPLY ONE PATCH WHEN YOU WAKE UP AND REMOVE THE OLD PATCH. PEEL THE BACK OFF THE PATCH AND PUT IT ON CLEAN,DRY, HAIR-FREE SKIN ON THE UPPER ARM, CHEST OR BACK     nicotine polacrilex (COMMIT) 4 MG lozenge Take 4 mg by mouth as needed. DISSOLVE 1 LOZENGE BY MOUTH AS DIRECTED - USE AT LEAST 8-10 LOZENGES  EACH DAY TO START. DO NOT USE MORE THAN 20 LOZENGES     pregabalin (LYRICA) 50 MG capsule Take 1 capsule (50 mg total) by mouth 3 (three) times daily. 90 capsule 1   rosuvastatin (CRESTOR) 20 MG tablet Take 10 mg by mouth daily.     sodium chloride (OCEAN) 0.65 % nasal spray SPRAY 2 SPRAYS INTO EACH NOSTRIL FOUR TIMES A DAY FOR SINUS CONGESTION     timolol (TIMOPTIC) 0.5 % ophthalmic solution Place 1 drop into both eyes 2 (two) times daily.     dasatinib (SPRYCEL) 70 MG tablet Take 1 tablet (70 mg total) by mouth daily. 30 tablet 2   ondansetron (ZOFRAN) 8 MG tablet One pill 30-45 mins prior to taking your chemo pill to prevent nausea. 30 tablet 3   No current facility-administered medications for this visit.      Marland Kitchen  PHYSICAL EXAMINATION: ECOG PERFORMANCE STATUS: 0 - Asymptomatic  Vitals:   01/10/23 0955  BP: 137/72  Pulse: 76  SpO2: 100%   Filed Weights   01/10/23 0955  Weight: 169 lb 9.6 oz (76.9 kg)    Physical Exam Constitutional:      Comments: Patient ambulating independently.  He is accompanied by wife/significant other.  HENT:     Head: Normocephalic and atraumatic.     Mouth/Throat:     Pharynx: No oropharyngeal exudate.  Eyes:     Pupils: Pupils are equal, round, and reactive to light.   Cardiovascular:     Rate and Rhythm: Normal rate and regular rhythm.  Pulmonary:     Effort: No respiratory distress.     Breath sounds: No wheezing.     Comments: Decreased air entry bilaterally. Abdominal:     General: Bowel sounds are normal. There is no distension.     Palpations: Abdomen is soft. There is no mass.     Tenderness: There is no abdominal tenderness. There is no guarding or rebound.  Musculoskeletal:        General: No tenderness. Normal range of motion.     Cervical back: Normal range of motion and neck supple.  Skin:    General: Skin is warm.  Neurological:     Mental Status: He is alert and oriented to person, place, and time.  Psychiatric:        Mood and Affect: Affect normal.      LABORATORY DATA:  I have reviewed the data as listed Lab Results  Component Value Date   WBC 6.8 12/25/2022   HGB 14.7 12/25/2022   HCT 44.0 12/25/2022   MCV 92.1 12/25/2022   PLT 188 12/25/2022   Recent Labs    07/08/22 1447 09/20/22 0905 12/25/22 0910  NA 134* 136 131*  K 4.3 4.1 4.2  CL 102 108 100  CO2 22 19* 21*  GLUCOSE 248* 335* 245*  BUN 20 22 18  $ CREATININE 1.32* 1.42* 1.31*  CALCIUM 9.4 8.6* 8.8*  GFRNONAA >60 56* >60  PROT 7.7 7.9 7.7  ALBUMIN 3.9 4.0 4.0  AST 27 29 20  $ ALT 20 18 16  $ ALKPHOS 57 62 67  BILITOT 0.2* 0.2* 0.6    RADIOGRAPHIC STUDIES: I have personally reviewed the radiological images as listed and agreed with the findings in the report. No results found.  ASSESSMENT & PLAN:   Chronic myeloid leukemia ([place]) #Chronic myeloid leukemia- on dasatinib 100 mg/day [1st week of April, 2022].  Tolerating well-except for nausea see below. JAN 2024 NEGATIVE for bcr-abl. FISH bcr-abl-  negative. We will repeat in 3 months again. CBC/CMP-wnl; except CKD-see below.   # Given ongoing nausea- post dasatinib; not improved with Anti-emetics-recommend Dasatinib 70 mg a day.  Discussed with pharmacy.  Patient will discontinue losartan 100  mg.  #Chronic diarrhea- sec to Dasatinib- r stable.  Imodium as needed.  #Mild anemia/CKD--hemoglobin 12.5 - saturation 10%; continued dietary [liver]--stable  #Weight loss-patient admits to poor appetite; mild to moderate diarrhea [patient denies it is from Dasatinib]-stable  #Reflux-recommend holding off PPIs-given the interaction with dasatinib.  Continue Tums as needed-however space out evening dose.   stable  #Diabetes-poorly controlled; currently on insulin pump- BG- 245  [VA]. stable  # CKD- stage III- GFR 57- stable.   # DISPOSITION:  # follow up in 3  months-MD; 2 weeks PRIOR labs- cbc/cmp/magnesium; Bcr-ABL RT-PCR; bcr-abl FISH panel; iron studies;ferritin; vit D 25-OH- Dr.B  All questions were answered. The patient knows to call the clinic with any problems, questions or concerns.     Cammie Sickle, MD 01/10/2023 10:55 AM

## 2023-01-13 ENCOUNTER — Other Ambulatory Visit (HOSPITAL_COMMUNITY): Payer: Self-pay

## 2023-01-18 ENCOUNTER — Other Ambulatory Visit: Payer: Self-pay | Admitting: Internal Medicine

## 2023-01-28 ENCOUNTER — Other Ambulatory Visit (HOSPITAL_COMMUNITY): Payer: Self-pay

## 2023-02-04 ENCOUNTER — Other Ambulatory Visit (HOSPITAL_COMMUNITY): Payer: Self-pay

## 2023-02-05 ENCOUNTER — Other Ambulatory Visit (HOSPITAL_COMMUNITY): Payer: Self-pay

## 2023-02-27 ENCOUNTER — Other Ambulatory Visit (HOSPITAL_COMMUNITY): Payer: Self-pay

## 2023-03-03 ENCOUNTER — Other Ambulatory Visit (HOSPITAL_COMMUNITY): Payer: Self-pay

## 2023-03-03 ENCOUNTER — Other Ambulatory Visit: Payer: Self-pay

## 2023-03-04 ENCOUNTER — Other Ambulatory Visit (HOSPITAL_COMMUNITY): Payer: Self-pay

## 2023-03-25 ENCOUNTER — Other Ambulatory Visit (HOSPITAL_COMMUNITY): Payer: Self-pay

## 2023-03-27 ENCOUNTER — Other Ambulatory Visit (HOSPITAL_COMMUNITY): Payer: Self-pay

## 2023-03-31 ENCOUNTER — Other Ambulatory Visit (HOSPITAL_COMMUNITY): Payer: Self-pay

## 2023-03-31 ENCOUNTER — Other Ambulatory Visit: Payer: Self-pay

## 2023-03-31 ENCOUNTER — Other Ambulatory Visit: Payer: Self-pay | Admitting: Internal Medicine

## 2023-03-31 DIAGNOSIS — C9210 Chronic myeloid leukemia, BCR/ABL-positive, not having achieved remission: Secondary | ICD-10-CM

## 2023-03-31 MED ORDER — DASATINIB 70 MG PO TABS
70.0000 mg | ORAL_TABLET | Freq: Every day | ORAL | 0 refills | Status: DC
  Filled 2023-03-31: qty 30, 30d supply, fill #0
  Filled 2023-05-05: qty 30, 30d supply, fill #1
  Filled 2023-06-11: qty 30, 30d supply, fill #2

## 2023-03-31 NOTE — Telephone Encounter (Signed)
CBC with Differential/Platelet Order: [id number] Status: Final result     Visible to patient: No (inaccessible in MyChart)     Next appt: 04/07/2023 at 09:30 AM in Oncology ([facility])     Dx: Chronic myeloid leukemia (HCC)   0 Result Notes          Component Ref Range & Units 3 mo ago (12/25/22) 6 mo ago (09/20/22) 8 mo ago (07/08/22) 10 mo ago (05/06/22) 1 yr ago (02/12/22) 1 yr ago (02/07/22) 1 yr ago (12/10/21)  WBC 4.0 - 10.5 K/uL 6.8 4.7 6.7 5.4 8.9 6.9 6.9  Comment: WHITE COUNT CONFIRMED ON SMEAR  RBC 4.22 - 5.81 MIL/uL 4.78 4.34 4.25 4.45 4.28 4.60 4.17 Low   Hemoglobin 13.0 - 17.0 g/dL 40.9 81.1 91.4 78.2 95.6 Low  14.1 12.9 Low   HCT 39.0 - 52.0 % 44.0 41.2 38.9 Low  41.1 38.9 Low  42.6 38.1 Low   MCV 80.0 - 100.0 fL 92.1 94.9 91.5 92.4 90.9 92.6 91.4  MCH 26.0 - 34.0 pg 30.8 31.6 30.8 31.0 30.1 30.7 30.9  MCHC 30.0 - 36.0 g/dL 21.3 08.6 57.8 46.9 62.9 33.1 33.9  RDW 11.5 - 15.5 % 13.2 13.9 14.6 14.6 14.8 14.8 13.7  Platelets 150 - 400 K/uL 188 193 174 202 202 208 217  nRBC 0.0 - 0.2 % 0.0 0.0 0.0 0.0 0.0 0.0 0.0  Neutrophils Relative % % 67 62 59 64 75 63 61  Neutro Abs 1.7 - 7.7 K/uL 4.5 2.9 4.0 3.5 6.6 4.3 4.2  Lymphocytes Relative % 17 18 22 21 13 24 25   Lymphs Abs 0.7 - 4.0 K/uL 1.2 0.8 1.4 1.1 1.2 1.7 1.7  Monocytes Relative % 9 13 10 8 7 7 8   Monocytes Absolute 0.1 - 1.0 K/uL 0.6 0.6 0.7 0.4 0.6 0.5 0.6  Eosinophils Relative % 7 7 8 6 5 6 5   Eosinophils Absolute 0.0 - 0.5 K/uL 0.5 0.3 0.5 0.3 0.5 0.4 0.4  Basophils Relative % 0 0 1 1 0 0 1  Basophils Absolute 0.0 - 0.1 K/uL 0.0 0.0 0.0 0.0 0.0 0.0 0.0  WBC Morphology MORPHOLOGY UNREMARKABLE        RBC Morphology MORPHOLOGY UNREMARKABLE        Smear Review MORPHOLOGY UNREMARKABLE        Immature Granulocytes % 0 0 0 0 0 0 0  Abs Immature Granulocytes 0.00 - 0.07 K/uL 0.02 0.01 CM 0.01 CM 0.01 CM 0.02 CM 0.02 CM 0.02 CM  Comment: Performed at [facility], [address]  [place], [place] [address]  Resulting Agency [facility]         Specimen Collected: 12/25/22 09:10 Last Resulted: 12/25/22 09:53      Comprehensive metabolic panel Order: [id number] Status: Final result     Visible to patient: No (inaccessible in MyChart)     Next appt: 04/07/2023 at 09:30 AM in Oncology ([facility])     Dx: Chronic myeloid leukemia (HCC)   0 Result Notes     1 HM Topic          Component Ref Range & Units 3 mo ago (12/25/22) 6 mo ago (09/20/22) 8 mo ago (07/08/22) 10 mo ago (05/06/22) 1 yr ago (02/12/22) 1 yr ago (02/07/22) 1 yr ago (12/10/21)  Sodium 135 - 145 mmol/L 131 Low  136 134 Low  137 135  135 133 Low   Potassium 3.5 - 5.1 mmol/L 4.2 4.1 4.3 3.9 3.4 Low  3.8 3.9  Chloride 98 - 111 mmol/L 100 108 102 109 106 104 104  CO2 22 - 32 mmol/L 21 Low  19 Low  22 23 23 23 20  Low   Glucose, Bld 70 - 99 mg/dL 161 High  096 High  CM 248 High  CM 268 High  CM 103 High  CM 185 High  CM 229 High  CM  Comment: Glucose reference range applies only to samples taken after fasting for at least 8 hours.  BUN 8 - 23 mg/dL 18 22 20 17 14 14 25  High   Creatinine, Ser 0.61 - 1.24 mg/dL 0.45 High  4.09 High  8.11 High  1.17 1.18 1.22 1.42 High   Calcium 8.9 - 10.3 mg/dL 8.8 Low  8.6 Low  9.4 8.3 Low  9.0 9.1 8.9  Total Protein 6.5 - 8.1 g/dL 7.7 7.9 7.7 7.5 7.9 7.6 7.6  Albumin 3.5 - 5.0 g/dL 4.0 4.0 3.9 4.0 4.3 4.1 4.3  AST 15 - 41 U/L 20 29 27 27  38 35 31  ALT 0 - 44 U/L 16 18 20 21 24 24 23   Alkaline Phosphatase 38 - 126 U/L 67 62 57 62 59 61 61  Total Bilirubin 0.3 - 1.2 mg/dL 0.6 0.2 Low  0.2 Low  0.4 0.5 0.3 0.2 Low   GFR, Estimated >60 mL/min >60 56 Low  CM >60 CM >60 CM >60 CM >60 CM 56 Low  CM  Comment: (NOTE) Calculated using the CKD-EPI Creatinine Equation (2021)  Anion gap 5 - 15 10 9  CM 10 CM 5 CM 6 CM 8 CM 9 CM  Comment: Performed at [facility], [address]  [place], [place] [address]  Resulting Agency [facility] CLIN [facility] CL[facility] CL[facility] CL[facility] CLIN LAB         Specimen Collected: 12/25/22 09:10 Last Resulted: 12/25/22 09:44

## 2023-04-07 ENCOUNTER — Inpatient Hospital Stay: Payer: 59 | Attending: Internal Medicine

## 2023-04-10 ENCOUNTER — Other Ambulatory Visit: Payer: 59

## 2023-04-11 ENCOUNTER — Other Ambulatory Visit: Payer: Self-pay

## 2023-04-24 ENCOUNTER — Ambulatory Visit: Payer: 59 | Admitting: Internal Medicine

## 2023-04-28 ENCOUNTER — Inpatient Hospital Stay: Payer: 59 | Attending: Internal Medicine | Admitting: Internal Medicine

## 2023-04-28 ENCOUNTER — Encounter: Payer: Self-pay | Admitting: Internal Medicine

## 2023-04-28 ENCOUNTER — Telehealth: Payer: Self-pay | Admitting: *Deleted

## 2023-04-28 ENCOUNTER — Inpatient Hospital Stay: Payer: 59

## 2023-04-28 VITALS — BP 127/72 | HR 65 | Temp 97.4°F | Resp 18 | Ht 68.0 in | Wt 171.4 lb

## 2023-04-28 DIAGNOSIS — R110 Nausea: Secondary | ICD-10-CM

## 2023-04-28 DIAGNOSIS — E1122 Type 2 diabetes mellitus with diabetic chronic kidney disease: Secondary | ICD-10-CM | POA: Insufficient documentation

## 2023-04-28 DIAGNOSIS — Z794 Long term (current) use of insulin: Secondary | ICD-10-CM | POA: Insufficient documentation

## 2023-04-28 DIAGNOSIS — C9210 Chronic myeloid leukemia, BCR/ABL-positive, not having achieved remission: Secondary | ICD-10-CM | POA: Diagnosis present

## 2023-04-28 DIAGNOSIS — R61 Generalized hyperhidrosis: Secondary | ICD-10-CM | POA: Insufficient documentation

## 2023-04-28 DIAGNOSIS — Z9641 Presence of insulin pump (external) (internal): Secondary | ICD-10-CM | POA: Insufficient documentation

## 2023-04-28 DIAGNOSIS — D649 Anemia, unspecified: Secondary | ICD-10-CM | POA: Insufficient documentation

## 2023-04-28 DIAGNOSIS — N183 Chronic kidney disease, stage 3 unspecified: Secondary | ICD-10-CM | POA: Insufficient documentation

## 2023-04-28 DIAGNOSIS — K5900 Constipation, unspecified: Secondary | ICD-10-CM | POA: Diagnosis not present

## 2023-04-28 DIAGNOSIS — K529 Noninfective gastroenteritis and colitis, unspecified: Secondary | ICD-10-CM | POA: Diagnosis not present

## 2023-04-28 LAB — MAGNESIUM: Magnesium: 1.9 mg/dL (ref 1.7–2.4)

## 2023-04-28 LAB — CBC WITH DIFFERENTIAL (CANCER CENTER ONLY)
Abs Immature Granulocytes: 0.03 10*3/uL (ref 0.00–0.07)
Basophils Absolute: 0.1 10*3/uL (ref 0.0–0.1)
Basophils Relative: 1 %
Eosinophils Absolute: 0.4 10*3/uL (ref 0.0–0.5)
Eosinophils Relative: 6 %
HCT: 43.3 % (ref 39.0–52.0)
Hemoglobin: 14.7 g/dL (ref 13.0–17.0)
Immature Granulocytes: 0 %
Lymphocytes Relative: 16 %
Lymphs Abs: 1.2 10*3/uL (ref 0.7–4.0)
MCH: 30.9 pg (ref 26.0–34.0)
MCHC: 33.9 g/dL (ref 30.0–36.0)
MCV: 91 fL (ref 80.0–100.0)
Monocytes Absolute: 0.6 10*3/uL (ref 0.1–1.0)
Monocytes Relative: 8 %
Neutro Abs: 5.2 10*3/uL (ref 1.7–7.7)
Neutrophils Relative %: 69 %
Platelet Count: 197 10*3/uL (ref 150–400)
RBC: 4.76 MIL/uL (ref 4.22–5.81)
RDW: 13.6 % (ref 11.5–15.5)
WBC Count: 7.4 10*3/uL (ref 4.0–10.5)
nRBC: 0 % (ref 0.0–0.2)

## 2023-04-28 LAB — IRON AND TIBC
Iron: 73 ug/dL (ref 45–182)
Saturation Ratios: 20 % (ref 17.9–39.5)
TIBC: 367 ug/dL (ref 250–450)
UIBC: 294 ug/dL

## 2023-04-28 LAB — CMP (CANCER CENTER ONLY)
ALT: 19 U/L (ref 0–44)
AST: 21 U/L (ref 15–41)
Albumin: 4.1 g/dL (ref 3.5–5.0)
Alkaline Phosphatase: 71 U/L (ref 38–126)
Anion gap: 6 (ref 5–15)
BUN: 34 mg/dL — ABNORMAL HIGH (ref 8–23)
CO2: 22 mmol/L (ref 22–32)
Calcium: 9.4 mg/dL (ref 8.9–10.3)
Chloride: 103 mmol/L (ref 98–111)
Creatinine: 1.41 mg/dL — ABNORMAL HIGH (ref 0.61–1.24)
GFR, Estimated: 56 mL/min — ABNORMAL LOW (ref >60)
Glucose, Bld: 277 mg/dL — ABNORMAL HIGH (ref 70–99)
Potassium: 4.7 mmol/L (ref 3.5–5.1)
Sodium: 131 mmol/L — ABNORMAL LOW (ref 135–145)
Total Bilirubin: 0.5 mg/dL (ref 0.3–1.2)
Total Protein: 8 g/dL (ref 6.5–8.1)

## 2023-04-28 LAB — FERRITIN: Ferritin: 27 ng/mL (ref 24–336)

## 2023-04-28 LAB — VITAMIN D 25 HYDROXY (VIT D DEFICIENCY, FRACTURES): Vit D, 25-Hydroxy: 23.83 ng/mL — ABNORMAL LOW (ref 30–100)

## 2023-04-28 MED ORDER — PROCHLORPERAZINE MALEATE 10 MG PO TABS: ORAL_TABLET | ORAL | 1 refills | Status: DC

## 2023-04-28 NOTE — Progress Notes (Signed)
[facility] CONSULT NOTE  Patient Care Team: System, Provider Not In as PCP - General Earna Coder, MD as Consulting Physician (Oncology)  CHIEF COMPLAINTS/PURPOSE OF CONSULTATION: CML  #  Oncology History Overview Note  Result Comment:   Comment: (NOTE)  ABNORMAL: 89% OF NUCLEI POSITIVE FOR BCR/ABL1 GENE FUSION  SIGNAL(S)     # MARCH 2022--  Hypercellular bone marrow with involvement by chronic myeloid  leukemia, BCR-ABL1-positive . he bone marrow is hypercellular for age with myeloid and megakaryocytic  hyperplasia.  There is no increase in blasts by manual differential count of aspirate smears (<1%) or by CD34 immunohistochemistry on the core biopsy.  A reticulin special stain reveals no significant increase  in reticulin fibrosis (MF grade 0 of 3).  # April 1st week -DASATINIB 100 mg; July 2022-FISH BCR ABL-negative for 9:22 abnormality [optimal; reached 4-month milestone. MMR at 10 months [gaol at 12];   # FEB 2024-secondary to chronic nausea-decrease dose of dasatinib to 70 mg/day.    #hypertension /alcoholic liver/ type 2 diabetes on insulin poorly controlled chronic back pain    Chronic myeloid leukemia (HCC)  01/31/2021 Initial Diagnosis   Chronic myeloid leukemia (HCC)    HISTORY OF PRESENTING ILLNESS: Ambulating independently.  Accompanied by his wife.  Frank [name] 62 y.o.  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  Some night sweats. Appetite up and down as well as energy level. His right elbow is hurting, he went fishing and now it hurts like crazy. Miralax for constipation.   Has nausea, no vomiting.  Patient has been taking antiemetic prior to his Dasatinib pill. Comes and goes.    Denies any worsening diarrhea or abdominal pain. No fever no chills.    Review of Systems  Constitutional:  Positive for malaise/fatigue. Negative for chills, diaphoresis, fever and weight loss.  HENT:  Negative  for nosebleeds and sore throat.   Eyes:  Negative for double vision.  Respiratory:  Negative for cough, hemoptysis, sputum production, shortness of breath and wheezing.   Cardiovascular:  Negative for chest pain, palpitations, orthopnea and leg swelling.  Gastrointestinal:  Negative for abdominal pain, blood in stool, constipation, diarrhea, heartburn, melena, nausea and vomiting.  Genitourinary:  Negative for dysuria, frequency and urgency.  Musculoskeletal:  Positive for back pain and joint pain.  Skin: Negative.  Negative for itching and rash.  Neurological:  Negative for dizziness, tingling, focal weakness, weakness and headaches.  Endo/Heme/Allergies:  Does not bruise/bleed easily.  Psychiatric/Behavioral:  Negative for depression. The patient is not nervous/anxious and does not have insomnia.      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 No pertinent surgical history.  SOCIAL HISTORY: Social History   Socioeconomic History   Marital status: Single    Spouse name: Not on file   Number of children: Not on file   Years of education: Not on file   Highest education level: Not on file  Occupational History   Occupation: retired     Comment: custodian  Tobacco Use   Smoking status: Some Days    Types: Cigarettes   Smokeless tobacco: Never   Tobacco comments:    4 cigarettes yesterday   Vaping Use   Vaping Use: Never used  Substance and Sexual Activity   Alcohol use: Yes    Comment: hx of ETOH abuse   Drug use: Yes    Types: Cocaine, Marijuana    Comment: had cocaine Sat. (2 days ago)   Sexual activity: Not on file  Other  Topics Concern   Not on file  Social History Narrative   Lives with S.O. [name]    Social Determinants of Health   Financial Resource Strain: Not on file  Food Insecurity: Not on file  Transportation Needs: Not on file  Physical Activity: Not on file  Stress: Not on file  Social Connections: Not on file  Intimate Partner Violence: Not on file    FAMILY HISTORY: Family History  Problem Relation Age of Onset   Diabetes Mother    Hypertension Mother    Heart failure Mother    Hyperlipidemia Mother    Thyroid disease Mother    Alcohol abuse Father    Emphysema Father    Kidney failure Father    Gout Father    Cancer Sister    Hyperlipidemia Sister    Hypertension Sister    Rheum arthritis Sister    Fibromyalgia Sister    Thyroid disease Sister     ALLERGIES:  has No Known Allergies.  MEDICATIONS:  Current Outpatient Medications  Medication Sig Dispense Refill   amLODipine (NORVASC) 10 MG tablet Take 0.5 tablets by mouth daily.     aspirin 81 MG EC tablet Take 1 tablet by mouth daily. With a meal     Cholecalciferol 25 MCG (1000 UT) tablet Take 2 tablets by mouth daily.     dasatinib (SPRYCEL) 70 MG tablet Take 1 tablet (70 mg total) by mouth daily. 90 tablet 0   fluticasone (FLONASE) 50 MCG/ACT nasal spray INSTILL 1 SPRAY INTO EACH NOSTRIL EVERY DAY AS NEEDED MAXIMUM 2 SPRAYS IN EACH NOSTRIL DAILY. FOR ALLERGIES     insulin aspart (NOVOLOG) 100 UNIT/ML injection Inject 100 Units into the skin continuous. INJECT 100 UNITS UNDER SKIN CONTINUOUS-VIA-PUMP AS DIRECTED FOR DIABETES.DISCARD BOTTLE 28 DAYS AFTER OPENING     insulin glargine (LANTUS) 100 UNIT/ML injection      latanoprost (XALATAN) 0.005 % ophthalmic solution Place 1 drop into both eyes at bedtime.     lipase/protease/amylase (CREON) 36000 UNITS CPEP capsule See admin instructions. TAKE 2 CAPSULES BY MOUTH WITH ACTIVE MEALS AND TAKE 1 CAPSULE WITH SNACK     lisinopril-hydrochlorothiazide (ZESTORETIC)  20-12.5 MG tablet Take 1 tablet by mouth daily.     methocarbamol (ROBAXIN) 500 MG tablet Take 1 tablet (500 mg total) by mouth every 8 (eight) hours as needed for muscle spasms. 90 tablet 1   pregabalin (LYRICA) 50 MG capsule Take 1 capsule (50 mg total) by mouth 3 (three) times daily. 90 capsule 1   prochlorperazine (COMPAZINE) 10 MG tablet Take 1 pill 30 to 45 minutes prior to taking your cancer pill. 60 tablet 1   rosuvastatin (CRESTOR) 20 MG tablet Take 10 mg by mouth daily.     sodium chloride (OCEAN) 0.65 % nasal spray SPRAY 2 SPRAYS INTO EACH NOSTRIL FOUR TIMES A DAY FOR SINUS CONGESTION     timolol (TIMOPTIC) 0.5 % ophthalmic solution Place 1 drop into both eyes 2 (two) times daily.  Brinzolamide-Brimonidine 1-0.2 % SUSP Place 1 drop into both eyes in the morning, at noon, and at bedtime.     guaiFENesin (MUCINEX) 600 MG 12 hr tablet TAKE ONE TABLET BY MOUTH TWO TIMES A DAY AS NEEDED FOR SINUS CONGESTION (Patient not taking: Reported on 04/28/2023)     nicotine (NICODERM CQ - DOSED IN MG/24 HOURS) 14 mg/24hr patch Place 14 mg onto the skin daily. APPLY ONE PATCH TO SKIN EVERY DAY ACTIVE APPLY ONE PATCH WHEN YOU WAKE UP AND REMOVE THE OLD PATCH. PEEL THE BACK OFF THE PATCH AND PUT IT ON CLEAN,DRY, HAIR-FREE SKIN ON THE UPPER ARM, CHEST OR BACK (Patient not taking: Reported on 04/28/2023)     nicotine polacrilex (COMMIT) 4 MG lozenge Take 4 mg by mouth as needed. DISSOLVE 1 LOZENGE BY MOUTH AS DIRECTED - USE AT LEAST 8-10 LOZENGES EACH DAY TO START. DO NOT USE MORE THAN 20 LOZENGES (Patient not taking: Reported on 04/28/2023)     ondansetron (ZOFRAN) 8 MG tablet TAKE ONE TABLET BY MOUTH 30-45 MINS PRIOR TO TAKING YOUR CHEMO TO PREVENT NAUSEA (Patient not taking: Reported on 04/28/2023) 30 tablet 3   No current facility-administered medications for this visit.      Marland Kitchen  PHYSICAL EXAMINATION: ECOG PERFORMANCE STATUS: 0 - Asymptomatic  Vitals:   04/28/23 1015  BP: 127/72  Pulse: 65  Resp:  18  Temp: (!) 97.4 F (36.3 C)  SpO2: 100%   Filed Weights   04/28/23 1015  Weight: 171 lb 6.4 oz (77.7 kg)    Physical Exam Constitutional:      Comments: Patient ambulating independently.  He is accompanied by wife/significant other.  HENT:     Head: Normocephalic and atraumatic.     Mouth/Throat:     Pharynx: No oropharyngeal exudate.  Eyes:     Pupils: Pupils are equal, round, and reactive to light.  Cardiovascular:     Rate and Rhythm: Normal rate and regular rhythm.  Pulmonary:     Effort: No respiratory distress.     Breath sounds: No wheezing.     Comments: Decreased air entry bilaterally. Abdominal:     General: Bowel sounds are normal. There is no distension.     Palpations: Abdomen is soft. There is no mass.     Tenderness: There is no abdominal tenderness. There is no guarding or rebound.  Musculoskeletal:        General: No tenderness. Normal range of motion.     Cervical back: Normal range of motion and neck supple.  Skin:    General: Skin is warm.  Neurological:     Mental Status: He is alert and oriented to person, place, and time.  Psychiatric:        Mood and Affect: Affect normal.      LABORATORY DATA:  I have reviewed the data as listed Lab Results  Component Value Date   WBC 7.4 04/28/2023   HGB 14.7 04/28/2023   HCT 43.3 04/28/2023   MCV 91.0 04/28/2023   PLT 197 04/28/2023   Recent Labs    09/20/22 0905 12/25/22 0910 04/28/23 0939  NA 136 131* 131*  K 4.1 4.2 4.7  CL 108 100 103  CO2 19* 21* 22  GLUCOSE 335* 245* 277*  BUN 22 18 34*  CREATININE 1.42* 1.31* 1.41*  CALCIUM 8.6* 8.8* 9.4  GFRNONAA 56* >60 56*  PROT 7.9 7.7 8.0  ALBUMIN 4.0 4.0 4.1  AST 29 20 21   ALT 18 16 19  ALKPHOS 62 67 71  BILITOT 0.2* 0.6 0.5    RADIOGRAPHIC STUDIES: I have personally reviewed the radiological images as listed and agreed with the findings in the report. No results found.  ASSESSMENT & PLAN:   Chronic myeloid leukemia  (HCC) #Chronic myeloid leukemia- on dasatinib 100 mg/day [1st week of April, 2022].  Tolerating well-except for nausea see below. JAN 2024 NEGATIVE for bcr-abl. FISH bcr-abl- negative. We will repeat in 3 months again. CBC/CMP-wnl; except CKD-see below. Pending labs today-   # Given ongoing nausea- post dasatinib; not improved with Anti-emetics- continue Dasatinib 70 [5pm] mg a day.  Discussed with pharmacy.    # Intermittent Nausea- sec to Dasatinib vs others [gastroparesis]-  zofran not helping; recommend compazine. Recommend evaluation with GI [facility] evaluation.   # right tennis elbow- ince; topical NSAIDs prn.   #Chronic diarrhea- sec to Dasatinib- stable.  Imodium as needed.  #Mild anemia/CKD--hemoglobin 12.5 - saturation 10%; continued dietary [liver]- Stable.   #Weight loss-patient admits to poor appetite; mild to moderate diarrhea [patient denies it is from Dasatinib]- Stable.   #Reflux-recommend holding off PPIs-given the interaction with dasatinib.  Continue Tums as needed-however space out evening dose. stable.  #Diabetes-poorly controlled; currently on insulin pump- BG- 277  [VA]. stable.  # CKD- stage III- GFR 57- stable.   # DISPOSITION:  # referral to  GI -[facility] re: nasuea/ ? gastroparesis # follow up in 3  months-MD; 2 weeks PRIOR labs- cbc/cmp/magnesium; Bcr-ABL RT-PCR; bcr-abl FISH panel; iron studies;ferritin; vit D 25-OH- Dr.B  All questions were answered. The patient knows to call the clinic with any problems, questions or concerns.     Earna Coder, MD 04/28/2023 11:15 AM

## 2023-04-28 NOTE — Progress Notes (Signed)
Some night sweats. Appetite up and down as well as energy level. His right elbow is hurting, he went fishing and now it hurts like crazy. Miralax for constipation. Has nausea, no vomiting. Comes and goes.

## 2023-04-28 NOTE — Telephone Encounter (Signed)
Referral placed in epic for [facility]. Referral info faxed to their office as well.

## 2023-04-28 NOTE — Assessment & Plan Note (Addendum)
#  Chronic myeloid leukemia- on dasatinib 100 mg/day [1st week of April, 2022].  Tolerating well-except for nausea see below. JAN 2024 NEGATIVE for bcr-abl. FISH bcr-abl- negative. We will repeat in 3 months again. CBC/CMP-wnl; except CKD-see below. Pending labs today-   # Given ongoing nausea- post dasatinib; not improved with Anti-emetics- continue Dasatinib 70 [5pm] mg a day.  Discussed with pharmacy.    # Intermittent Nausea- sec to Dasatinib vs others [gastroparesis]-  zofran not helping; recommend compazine. Recommend evaluation with GI [facility] evaluation.   # right tennis elbow- ince; topical NSAIDs prn.   #Chronic diarrhea- sec to Dasatinib- stable.  Imodium as needed.  #Mild anemia/CKD--hemoglobin 12.5 - saturation 10%; continued dietary [liver]- Stable.   #Weight loss-patient admits to poor appetite; mild to moderate diarrhea [patient denies it is from Dasatinib]- Stable.   #Reflux-recommend holding off PPIs-given the interaction with dasatinib.  Continue Tums as needed-however space out evening dose. stable.  #Diabetes-poorly controlled; currently on insulin pump- BG- 277  [VA]. stable.  # CKD- stage III- GFR 57- stable.   # DISPOSITION:  # referral to  GI [facility] re: nasuea/ ? gastroparesis # follow up in 3  months-MD; 2 weeks PRIOR labs- cbc/cmp/magnesium; Bcr-ABL RT-PCR; bcr-abl FISH panel; iron studies;ferritin; vit D 25-OH- Dr.B

## 2023-04-29 ENCOUNTER — Telehealth: Payer: Self-pay | Admitting: *Deleted

## 2023-04-29 MED ORDER — ERGOCALCIFEROL 1.25 MG (50000 UT) PO CAPS: 50000.0000 [IU] | ORAL_CAPSULE | ORAL | 3 refills | Status: DC

## 2023-04-29 NOTE — Telephone Encounter (Signed)
Called patient told him his Vit is low. Pt is taking a low dose Vit D, stop that pill. Dr B is calling in a stronger pill that he needs to take once a week. Called into [facility] as this is a prescription strength tablet. Pt states he understands.

## 2023-05-01 ENCOUNTER — Other Ambulatory Visit (HOSPITAL_COMMUNITY): Payer: Self-pay

## 2023-05-04 LAB — BCR-ABL1, CML/ALL, PCR, QUANT: Interpretation (BCRAL):: NEGATIVE

## 2023-05-04 LAB — BCR-ABL1 FISH
Cells Analyzed: 200
Cells Counted: 200

## 2023-05-05 ENCOUNTER — Other Ambulatory Visit (HOSPITAL_COMMUNITY): Payer: Self-pay

## 2023-05-08 ENCOUNTER — Telehealth: Payer: Self-pay

## 2023-05-08 ENCOUNTER — Other Ambulatory Visit: Payer: Self-pay

## 2023-05-08 ENCOUNTER — Other Ambulatory Visit (HOSPITAL_COMMUNITY): Payer: Self-pay

## 2023-05-08 MED ORDER — ERGOCALCIFEROL 1.25 MG (50000 UT) PO CAPS: 50000.0000 [IU] | ORAL_CAPSULE | ORAL | 3 refills | Status: DC

## 2023-05-08 NOTE — Telephone Encounter (Signed)
Informed patient Frank [name] levels are still and Dr. Donneta Romberg recommend to continue taking. Sent prescription for vitamin [name] 50,000 units weekly. Patient notified and verbalizes understanding.

## 2023-05-13 ENCOUNTER — Other Ambulatory Visit (HOSPITAL_COMMUNITY): Payer: Self-pay

## 2023-06-09 ENCOUNTER — Other Ambulatory Visit (HOSPITAL_COMMUNITY): Payer: Self-pay

## 2023-06-11 ENCOUNTER — Other Ambulatory Visit (HOSPITAL_COMMUNITY): Payer: Self-pay

## 2023-06-20 ENCOUNTER — Inpatient Hospital Stay: Admit: 2023-06-20 | Payer: 59

## 2023-06-20 ENCOUNTER — Inpatient Hospital Stay: Payer: 59

## 2023-06-20 ENCOUNTER — Other Ambulatory Visit: Payer: Self-pay

## 2023-06-20 ENCOUNTER — Inpatient Hospital Stay
Admission: EM | Admit: 2023-06-20 | Discharge: 2023-06-23 | DRG: 841 | Disposition: A | Discharge status: Home / Self Care | Payer: 59 | Attending: Internal Medicine | Admitting: Internal Medicine

## 2023-06-20 ENCOUNTER — Emergency Department: Payer: 59

## 2023-06-20 ENCOUNTER — Encounter: Payer: Self-pay | Admitting: Emergency Medicine

## 2023-06-20 DIAGNOSIS — R0609 Other forms of dyspnea: Secondary | ICD-10-CM

## 2023-06-20 DIAGNOSIS — Z83438 Family history of other disorder of lipoprotein metabolism and other lipidemia: Secondary | ICD-10-CM

## 2023-06-20 DIAGNOSIS — I129 Hypertensive chronic kidney disease with stage 1 through stage 4 chronic kidney disease, or unspecified chronic kidney disease: Secondary | ICD-10-CM | POA: Diagnosis present

## 2023-06-20 DIAGNOSIS — I10 Essential (primary) hypertension: Secondary | ICD-10-CM

## 2023-06-20 DIAGNOSIS — Z7982 Long term (current) use of aspirin: Secondary | ICD-10-CM | POA: Diagnosis not present

## 2023-06-20 DIAGNOSIS — F1010 Alcohol abuse, uncomplicated: Secondary | ICD-10-CM | POA: Diagnosis present

## 2023-06-20 DIAGNOSIS — N182 Chronic kidney disease, stage 2 (mild): Secondary | ICD-10-CM | POA: Diagnosis present

## 2023-06-20 DIAGNOSIS — Z794 Long term (current) use of insulin: Secondary | ICD-10-CM

## 2023-06-20 DIAGNOSIS — E785 Hyperlipidemia, unspecified: Secondary | ICD-10-CM | POA: Diagnosis present

## 2023-06-20 DIAGNOSIS — K861 Other chronic pancreatitis: Secondary | ICD-10-CM | POA: Diagnosis present

## 2023-06-20 DIAGNOSIS — E782 Mixed hyperlipidemia: Secondary | ICD-10-CM

## 2023-06-20 DIAGNOSIS — F17210 Nicotine dependence, cigarettes, uncomplicated: Secondary | ICD-10-CM | POA: Diagnosis present

## 2023-06-20 DIAGNOSIS — Z825 Family history of asthma and other chronic lower respiratory diseases: Secondary | ICD-10-CM

## 2023-06-20 DIAGNOSIS — J449 Chronic obstructive pulmonary disease, unspecified: Secondary | ICD-10-CM | POA: Diagnosis present

## 2023-06-20 DIAGNOSIS — Z8261 Family history of arthritis: Secondary | ICD-10-CM | POA: Diagnosis not present

## 2023-06-20 DIAGNOSIS — E1165 Type 2 diabetes mellitus with hyperglycemia: Secondary | ICD-10-CM | POA: Diagnosis present

## 2023-06-20 DIAGNOSIS — Z833 Family history of diabetes mellitus: Secondary | ICD-10-CM | POA: Diagnosis not present

## 2023-06-20 DIAGNOSIS — C9210 Chronic myeloid leukemia, BCR/ABL-positive, not having achieved remission: Principal | ICD-10-CM | POA: Diagnosis present

## 2023-06-20 DIAGNOSIS — Z841 Family history of disorders of kidney and ureter: Secondary | ICD-10-CM | POA: Diagnosis not present

## 2023-06-20 DIAGNOSIS — Z8249 Family history of ischemic heart disease and other diseases of the circulatory system: Secondary | ICD-10-CM

## 2023-06-20 DIAGNOSIS — F17200 Nicotine dependence, unspecified, uncomplicated: Secondary | ICD-10-CM

## 2023-06-20 DIAGNOSIS — Z9641 Presence of insulin pump (external) (internal): Secondary | ICD-10-CM | POA: Diagnosis present

## 2023-06-20 DIAGNOSIS — Z7722 Contact with and (suspected) exposure to environmental tobacco smoke (acute) (chronic): Secondary | ICD-10-CM

## 2023-06-20 DIAGNOSIS — T451X5A Adverse effect of antineoplastic and immunosuppressive drugs, initial encounter: Secondary | ICD-10-CM | POA: Diagnosis present

## 2023-06-20 DIAGNOSIS — R739 Hyperglycemia, unspecified: Secondary | ICD-10-CM

## 2023-06-20 DIAGNOSIS — N189 Chronic kidney disease, unspecified: Secondary | ICD-10-CM | POA: Diagnosis present

## 2023-06-20 DIAGNOSIS — J90 Pleural effusion, not elsewhere classified: Secondary | ICD-10-CM | POA: Diagnosis not present

## 2023-06-20 DIAGNOSIS — E1122 Type 2 diabetes mellitus with diabetic chronic kidney disease: Secondary | ICD-10-CM | POA: Diagnosis present

## 2023-06-20 DIAGNOSIS — Z8349 Family history of other endocrine, nutritional and metabolic diseases: Secondary | ICD-10-CM | POA: Diagnosis not present

## 2023-06-20 DIAGNOSIS — Z9889 Other specified postprocedural states: Secondary | ICD-10-CM

## 2023-06-20 HISTORY — DX: Chronic myeloid leukemia, BCR/ABL-positive, not having achieved remission: C92.10

## 2023-06-20 LAB — BODY FLUID CELL COUNT WITH DIFFERENTIAL
Eos, Fluid: 0 %
Lymphs, Fluid: 96 %
Monocyte-Macrophage-Serous Fluid: 3 %
Neutrophil Count, Fluid: 1 %
Other Cells, Fluid: 0 %
Total Nucleated Cell Count, Fluid: 2530 cu mm

## 2023-06-20 LAB — ECHOCARDIOGRAM COMPLETE
AR max vel: 2.45 cm2
AV Area VTI: 2.05 cm2
AV Area mean vel: 2.34 cm2
AV Mean grad: 3 mmHg
AV Peak grad: 5.5 mmHg
Ao pk vel: 1.17 m/s
Area-P 1/2: 3.33 cm2
Height: 68 in
MV VTI: 2.58 cm2
S' Lateral: 2.2 cm
Weight: 2740.76 oz

## 2023-06-20 LAB — HEPATIC FUNCTION PANEL
ALT: 21 U/L (ref 0–44)
AST: 21 U/L (ref 15–41)
Albumin: 3.9 g/dL (ref 3.5–5.0)
Alkaline Phosphatase: 62 U/L (ref 38–126)
Bilirubin, Direct: <0.1 mg/dL (ref 0.0–0.2)
Total Bilirubin: 0.4 mg/dL (ref 0.3–1.2)
Total Protein: 7.6 g/dL (ref 6.5–8.1)

## 2023-06-20 LAB — CBC WITH DIFFERENTIAL/PLATELET
Abs Immature Granulocytes: 0.03 10*3/uL (ref 0.00–0.07)
Basophils Absolute: 0 10*3/uL (ref 0.0–0.1)
Basophils Relative: 1 %
Eosinophils Absolute: 0.4 10*3/uL (ref 0.0–0.5)
Eosinophils Relative: 5 %
HCT: 41.4 % (ref 39.0–52.0)
Hemoglobin: 14 g/dL (ref 13.0–17.0)
Immature Granulocytes: 0 %
Lymphocytes Relative: 14 %
Lymphs Abs: 1.1 10*3/uL (ref 0.7–4.0)
MCH: 30.7 pg (ref 26.0–34.0)
MCHC: 33.8 g/dL (ref 30.0–36.0)
MCV: 90.8 fL (ref 80.0–100.0)
Monocytes Absolute: 0.5 10*3/uL (ref 0.1–1.0)
Monocytes Relative: 6 %
Neutro Abs: 5.9 10*3/uL (ref 1.7–7.7)
Neutrophils Relative %: 74 %
Platelets: 164 10*3/uL (ref 150–400)
RBC: 4.56 MIL/uL (ref 4.22–5.81)
RDW: 13.8 % (ref 11.5–15.5)
WBC: 8 10*3/uL (ref 4.0–10.5)
nRBC: 0 % (ref 0.0–0.2)

## 2023-06-20 LAB — BASIC METABOLIC PANEL
Anion gap: 8 (ref 5–15)
BUN: 17 mg/dL (ref 8–23)
CO2: 21 mmol/L — ABNORMAL LOW (ref 22–32)
Calcium: 8.9 mg/dL (ref 8.9–10.3)
Chloride: 106 mmol/L (ref 98–111)
Creatinine, Ser: 0.94 mg/dL (ref 0.61–1.24)
GFR, Estimated: >60 mL/min (ref >60)
Glucose, Bld: 235 mg/dL — ABNORMAL HIGH (ref 70–99)
Potassium: 3.6 mmol/L (ref 3.5–5.1)
Sodium: 135 mmol/L (ref 135–145)

## 2023-06-20 LAB — HIV ANTIBODY (ROUTINE TESTING W REFLEX): HIV Screen 4th Generation wRfx: NONREACTIVE

## 2023-06-20 LAB — PROTEIN, PLEURAL OR PERITONEAL FLUID: Total protein, fluid: 4.4 g/dL

## 2023-06-20 LAB — GLUCOSE, CAPILLARY
Glucose-Capillary: 323 mg/dL — ABNORMAL HIGH (ref 70–99)
Glucose-Capillary: 274 mg/dL — ABNORMAL HIGH (ref 70–99)
Glucose-Capillary: 157 mg/dL — ABNORMAL HIGH (ref 70–99)

## 2023-06-20 LAB — LIPASE, BLOOD: Lipase: 22 U/L (ref 11–51)

## 2023-06-20 LAB — URINALYSIS, ROUTINE W REFLEX MICROSCOPIC
Bacteria, UA: NONE SEEN
Bilirubin Urine: NEGATIVE
Glucose, UA: >=500 mg/dL — AB
Hgb urine dipstick: NEGATIVE
Ketones, ur: 5 mg/dL — AB
Leukocytes,Ua: NEGATIVE
Nitrite: NEGATIVE
Protein, ur: 30 mg/dL — AB
Specific Gravity, Urine: 1.013 (ref 1.005–1.030)
Squamous Epithelial / HPF: NONE SEEN /HPF (ref 0–5)
pH: 6 (ref 5.0–8.0)

## 2023-06-20 LAB — LACTATE DEHYDROGENASE, PLEURAL OR PERITONEAL FLUID: LD, Fluid: 135 U/L — ABNORMAL HIGH (ref 3–23)

## 2023-06-20 LAB — GLUCOSE, PLEURAL OR PERITONEAL FLUID: Glucose, Fluid: 231 mg/dL

## 2023-06-20 LAB — BRAIN NATRIURETIC PEPTIDE: B Natriuretic Peptide: 81.5 pg/mL (ref 0.0–100.0)

## 2023-06-20 LAB — CBG MONITORING, ED: Glucose-Capillary: 196 mg/dL — ABNORMAL HIGH (ref 70–99)

## 2023-06-20 MED ORDER — DORZOLAMIDE HCL-TIMOLOL MAL 2-0.5 % OP SOLN
1.0000 [drp] | Freq: Two times a day (BID) | OPHTHALMIC | Status: DC
  Administered 2023-06-20 – 2023-06-23 (×7): 1 [drp] via OPHTHALMIC
  Filled 2023-06-20: qty 10

## 2023-06-20 MED ORDER — BISACODYL 10 MG RE SUPP: 10.0000 mg | Freq: Every day | RECTAL | Status: DC | PRN

## 2023-06-20 MED ORDER — INSULIN GLARGINE-YFGN 100 UNIT/ML ~~LOC~~ SOLN
12.0000 [IU] | Freq: Every day | SUBCUTANEOUS | Status: DC
  Administered 2023-06-20: 12 [IU] via SUBCUTANEOUS
  Filled 2023-06-20 (×2): qty 0.12

## 2023-06-20 MED ORDER — HYDROMORPHONE HCL 1 MG/ML IJ SOLN
0.5000 mg | INTRAMUSCULAR | Status: DC | PRN
  Administered 2023-06-20 – 2023-06-23 (×21): 0.5 mg via INTRAVENOUS
  Filled 2023-06-20 (×21): qty 0.5

## 2023-06-20 MED ORDER — LIDOCAINE HCL (PF) 1 % IJ SOLN
10.0000 mL | Freq: Once | INTRAMUSCULAR | Status: AC
  Administered 2023-06-20: 10 mL via INTRADERMAL

## 2023-06-20 MED ORDER — VITAMIN D (ERGOCALCIFEROL) 1.25 MG (50000 UNIT) PO CAPS
50000.0000 [IU] | ORAL_CAPSULE | ORAL | Status: DC
  Administered 2023-06-23: 50000 [IU] via ORAL
  Filled 2023-06-20: qty 1

## 2023-06-20 MED ORDER — ONDANSETRON HCL 4 MG PO TABS
4.0000 mg | ORAL_TABLET | Freq: Three times a day (TID) | ORAL | Status: DC | PRN
  Administered 2023-06-20 – 2023-06-21 (×2): 4 mg via ORAL
  Filled 2023-06-20 (×2): qty 1

## 2023-06-20 MED ORDER — HYDRALAZINE HCL 20 MG/ML IJ SOLN: 10.0000 mg | Freq: Four times a day (QID) | INTRAMUSCULAR | Status: DC | PRN

## 2023-06-20 MED ORDER — ACETAMINOPHEN 650 MG RE SUPP: 650.0000 mg | Freq: Four times a day (QID) | RECTAL | Status: DC | PRN

## 2023-06-20 MED ORDER — HYDROCHLOROTHIAZIDE 12.5 MG PO TABS
12.5000 mg | ORAL_TABLET | Freq: Every day | ORAL | Status: DC
  Administered 2023-06-21 – 2023-06-23 (×3): 12.5 mg via ORAL
  Filled 2023-06-20 (×3): qty 1

## 2023-06-20 MED ORDER — LISINOPRIL 20 MG PO TABS
20.0000 mg | ORAL_TABLET | Freq: Every day | ORAL | Status: DC
  Administered 2023-06-21 – 2023-06-23 (×3): 20 mg via ORAL
  Filled 2023-06-20 (×3): qty 1

## 2023-06-20 MED ORDER — AMLODIPINE BESYLATE 5 MG PO TABS
5.0000 mg | ORAL_TABLET | Freq: Every day | ORAL | Status: DC
  Administered 2023-06-21: 5 mg via ORAL
  Filled 2023-06-20: qty 1

## 2023-06-20 MED ORDER — IOHEXOL 300 MG/ML  SOLN
100.0000 mL | Freq: Once | INTRAMUSCULAR | Status: AC | PRN
  Administered 2023-06-20: 100 mL via INTRAVENOUS

## 2023-06-20 MED ORDER — NICOTINE 7 MG/24HR TD PT24
7.0000 mg | MEDICATED_PATCH | Freq: Every day | TRANSDERMAL | Status: DC
  Administered 2023-06-20 – 2023-06-21 (×2): 7 mg via TRANSDERMAL
  Filled 2023-06-20 (×2): qty 1

## 2023-06-20 MED ORDER — NICOTINE 7 MG/24HR TD PT24
7.0000 mg | MEDICATED_PATCH | Freq: Every day | TRANSDERMAL | Status: DC
  Filled 2023-06-20 (×2): qty 1

## 2023-06-20 MED ORDER — LISINOPRIL-HYDROCHLOROTHIAZIDE 20-12.5 MG PO TABS: 1.0000 | ORAL_TABLET | Freq: Every day | ORAL | Status: DC

## 2023-06-20 MED ORDER — ACETAMINOPHEN 325 MG PO TABS: 650.0000 mg | ORAL_TABLET | Freq: Four times a day (QID) | ORAL | Status: DC | PRN

## 2023-06-20 MED ORDER — GUAIFENESIN ER 600 MG PO TB12
600.0000 mg | ORAL_TABLET | Freq: Two times a day (BID) | ORAL | Status: DC
  Administered 2023-06-20 – 2023-06-23 (×7): 600 mg via ORAL
  Filled 2023-06-20 (×7): qty 1

## 2023-06-20 MED ORDER — SODIUM CHLORIDE 0.9 % IV BOLUS
1000.0000 mL | Freq: Once | INTRAVENOUS | Status: AC
  Administered 2023-06-20: 1000 mL via INTRAVENOUS

## 2023-06-20 MED ORDER — INSULIN ASPART 100 UNIT/ML IJ SOLN
0.0000 [IU] | Freq: Three times a day (TID) | INTRAMUSCULAR | Status: DC
  Administered 2023-06-20: 11 [IU] via SUBCUTANEOUS
  Administered 2023-06-21 (×2): 5 [IU] via SUBCUTANEOUS
  Administered 2023-06-22 (×2): 3 [IU] via SUBCUTANEOUS
  Administered 2023-06-23 (×2): 11 [IU] via SUBCUTANEOUS
  Filled 2023-06-20 (×7): qty 1

## 2023-06-20 MED ORDER — ENOXAPARIN SODIUM 40 MG/0.4ML IJ SOSY
40.0000 mg | PREFILLED_SYRINGE | INTRAMUSCULAR | Status: DC
  Administered 2023-06-20 – 2023-06-22 (×3): 40 mg via SUBCUTANEOUS
  Filled 2023-06-20 (×3): qty 0.4

## 2023-06-20 MED ORDER — ROSUVASTATIN CALCIUM 10 MG PO TABS
10.0000 mg | ORAL_TABLET | Freq: Every day | ORAL | Status: DC
  Administered 2023-06-21 – 2023-06-23 (×3): 10 mg via ORAL
  Filled 2023-06-20 (×3): qty 1

## 2023-06-20 MED ORDER — MORPHINE SULFATE (PF) 4 MG/ML IV SOLN
4.0000 mg | Freq: Once | INTRAVENOUS | Status: AC
  Administered 2023-06-20: 4 mg via INTRAVENOUS
  Filled 2023-06-20: qty 1

## 2023-06-20 MED ORDER — LATANOPROST 0.005 % OP SOLN
1.0000 [drp] | Freq: Every day | OPHTHALMIC | Status: DC
  Administered 2023-06-20 – 2023-06-22 (×3): 1 [drp] via OPHTHALMIC
  Filled 2023-06-20: qty 2.5

## 2023-06-20 MED ORDER — INSULIN ASPART 100 UNIT/ML IJ SOLN
0.0000 [IU] | Freq: Every day | INTRAMUSCULAR | Status: DC
  Administered 2023-06-21: 3 [IU] via SUBCUTANEOUS
  Administered 2023-06-22: 2 [IU] via SUBCUTANEOUS
  Filled 2023-06-20 (×2): qty 1

## 2023-06-20 MED ORDER — PREGABALIN 50 MG PO CAPS
50.0000 mg | ORAL_CAPSULE | Freq: Three times a day (TID) | ORAL | Status: DC
  Administered 2023-06-20 – 2023-06-23 (×10): 50 mg via ORAL
  Filled 2023-06-20 (×10): qty 1

## 2023-06-20 MED ORDER — FLUTICASONE PROPIONATE 50 MCG/ACT NA SUSP
1.0000 | Freq: Every day | NASAL | Status: DC
  Administered 2023-06-20 – 2023-06-23 (×3): 1 via NASAL
  Filled 2023-06-20: qty 16

## 2023-06-20 NOTE — ED Triage Notes (Signed)
Presents via EMS with left sided abd pain and back pain  States pain started yesterday

## 2023-06-20 NOTE — ED Provider Notes (Signed)
[facility] Provider Note    None    (approximate)   History   Abdominal Pain   HPI  Frank [name] is a 62 y.o. male with a past medical history of CML on Dasatinib 100mg  daily, hypertension, hyperlipidemia, chronic pancreatitis, diabetes type 1 who presents today for evaluation of left-sided abdominal pain and left-sided back pain that began yesterday.  Patient reports that he drinks beer, and thinks that this has exacerbated his known pancreatitis.  He reports that he had 2 beers yesterday.  He reports a 1 episode of vomiting today but has had been able to drink water without vomiting.  He denies any injuries to his back.  He reports that he takes his chemo oral therapy daily and has not missed any doses.  He denies any weakness or paresthesias in his legs.  He has not had any urinary symptoms.  No urinary or fecal incontinence or retention.  No saddle anesthesia.  No fevers or chills.  Patient Active Problem List   Diagnosis Date Noted   Pleural effusion 06/20/2023   Encounter for antineoplastic chemotherapy 04/10/2021   Normocytic anemia 04/10/2021   Chronic myeloid leukemia (HCC) 01/31/2021   Chronic kidney disease 01/31/2021   Hypertension 01/31/2021   Hyperlipidemia 01/31/2021   Type 2 diabetes mellitus with chronic kidney disease, with long-term current use of insulin (HCC) 01/31/2021   AKI (acute kidney injury) (HCC) 01/22/2021   Neutrophilia 01/22/2021   DDD (degenerative disc disease), lumbar 12/04/2014          Physical Exam   Triage Vital Signs: ED Triage Vitals  Encounter Vitals Group     BP      Systolic BP Percentile      Diastolic BP Percentile      Pulse      Resp      Temp      Temp src      SpO2      Weight      Height      Head Circumference      Peak Flow      Pain Score      Pain Loc      Pain Education      Exclude from Growth Chart     Most recent vital signs: Vitals:   06/20/23 0820 06/20/23 1306  BP:  (!) 156/81 (!) 160/98  Pulse: 67 68  Resp: 18 18  Temp: 97.7 F (36.5 C) 97.9 F (36.6 C)  SpO2: 98% 100%    Physical Exam Vitals and nursing note reviewed.  Constitutional:      General: Awake and alert. No acute distress.    Appearance: Normal appearance. The patient is overweight.  HENT:     Head: Normocephalic and atraumatic.     Mouth: Mucous membranes are moist.  Eyes:     General: PERRL. Normal EOMs        Right eye: No discharge.        Left eye: No discharge.     Conjunctiva/sclera: Conjunctivae normal.  Cardiovascular:     Rate and Rhythm: Normal rate and regular rhythm.     Pulses: Normal pulses.  Pulmonary:     Effort: Pulmonary effort is normal. No respiratory distress.     Breath sounds: Normal breath sounds.  Abdominal:     Abdomen is soft. There is diffuse abdominal tenderness. No rebound or guarding. No distention. Musculoskeletal:        General: No swelling.  Normal range of motion.     Cervical back: Normal range of motion and neck supple.  Back: No midline tenderness.  Tenderness to palpation to left lumbar paraspinal muscle area.  Strength and sensation 5/5 to bilateral lower extremities. Normal great toe extension against resistance. Normal sensation throughout feet. Normal patellar reflexes. Negative SLR and opposite SLR bilaterally.  Skin:    General: Skin is warm and dry.     Capillary Refill: Capillary refill takes less than 2 seconds.     Findings: No rash.  Neurological:     Mental Status: The patient is awake and alert.      ED Results / Procedures / Treatments   Labs (all labs ordered are listed, but only abnormal results are displayed) Labs Reviewed  URINALYSIS, ROUTINE W REFLEX MICROSCOPIC - Abnormal; Notable for the following components:      Result Value   Color, Urine STRAW (*)    APPearance CLEAR (*)    Glucose, UA >=500 (*)    Ketones, ur 5 (*)    Protein, ur 30 (*)    All other components within normal limits  BASIC  METABOLIC PANEL - Abnormal; Notable for the following components:   CO2 21 (*)    Glucose, Bld 235 (*)    All other components within normal limits  CBG MONITORING, ED - Abnormal; Notable for the following components:   Glucose-Capillary 196 (*)    All other components within normal limits  CBC WITH DIFFERENTIAL/PLATELET  HEPATIC FUNCTION PANEL  LIPASE, BLOOD  BRAIN NATRIURETIC PEPTIDE  HEMOGLOBIN A1C  HIV ANTIBODY (ROUTINE TESTING W REFLEX)     EKG     RADIOLOGY I independently reviewed and interpreted imaging and agree with radiologists findings.     PROCEDURES:  Critical Care performed:   Procedures   MEDICATIONS ORDERED IN ED: Medications  dorzolamide-timolol (COSOPT) 2-0.5 % ophthalmic solution 1 drop (has no administration in time range)  latanoprost (XALATAN) 0.005 % ophthalmic solution 1 drop (has no administration in time range)  guaiFENesin (MUCINEX) 12 hr tablet 600 mg (has no administration in time range)  fluticasone (FLONASE) 50 MCG/ACT nasal spray 1 spray (has no administration in time range)  Vitamin D (Ergocalciferol) (DRISDOL) 1.25 MG (50000 UNIT) capsule 50,000 Units (has no administration in time range)  pregabalin (LYRICA) capsule 50 mg (has no administration in time range)  ondansetron (ZOFRAN) tablet 4 mg (has no administration in time range)  insulin glargine-yfgn (SEMGLEE) injection 12 Units (has no administration in time range)  rosuvastatin (CRESTOR) tablet 10 mg (has no administration in time range)  amLODipine (NORVASC) tablet 5 mg (has no administration in time range)  enoxaparin (LOVENOX) injection 40 mg (has no administration in time range)  insulin aspart (novoLOG) injection 0-15 Units (has no administration in time range)  insulin aspart (novoLOG) injection 0-5 Units (has no administration in time range)  hydrALAZINE (APRESOLINE) injection 10 mg (has no administration in time range)  bisacodyl (DULCOLAX) suppository 10 mg (has no  administration in time range)  acetaminophen (TYLENOL) tablet 650 mg (has no administration in time range)    Or  acetaminophen (TYLENOL) suppository 650 mg (has no administration in time range)  HYDROmorphone (DILAUDID) injection 0.5 mg (0.5 mg Intravenous Given 06/20/23 1401)  lisinopril (ZESTRIL) tablet 20 mg (has no administration in time range)    And  hydrochlorothiazide (HYDRODIURIL) tablet 12.5 mg (has no administration in time range)  nicotine (NICODERM CQ - dosed in mg/24 hr) patch 7 mg (has no  administration in time range)  morphine (PF) 4 MG/ML injection 4 mg (4 mg Intravenous Given 06/20/23 0846)  sodium chloride 0.9 % bolus 1,000 mL (1,000 mLs Intravenous New Bag/Given 06/20/23 0845)  iohexol (OMNIPAQUE) 300 MG/ML solution 100 mL (100 mLs Intravenous Contrast Given 06/20/23 1038)     IMPRESSION / MDM / ASSESSMENT AND PLAN / ED COURSE  I reviewed the triage vital signs and the nursing notes.   Differential diagnosis includes, but is not limited to, pancreatitis, osseous lytic lesions, diverticulitis, nephrolithiasis, muscle spasm.  Patient is awake and alert, hemodynamically stable and afebrile.  I reviewed the patient's chart.  Patient was most recently seen by his outpatient provider on 04/30/2023.  He was also seen by his oncologist on 04/28/2023.  During his visit with his oncologist he also complained of nausea and back pain.  He was started on Compazine as needed and referred to gastroenterology.  Further workup is indicated today.  IV was established and labs were obtained.  Labs are overall reassuring with the exception of hyperglycemia, though no increased anion gap and no significant drop in bicarb, not consistent with DKA.  I also feel a CT scan is appropriate for evaluation of intra-abdominal pathology including pancreatitis with pseudocyst or necrosis, or other intra-abdominal etiology.  I also added on a CT lumbar spine to evaluate for lytic lesions given his back pain and  history of leukemia.  Labs are overall reassuring.  CT L-spine reveals no lytic lesions or abnormalities noted.  CT abdomen and pelvis was noted to have large bilateral pleural effusions with adjacent opacities, I was called by the radiology tech to add on a CT chest for further evaluation.  There are no abnormalities noted in the abdomen and pelvis besides his known atrophic pancreas from his known history of chronic calcific pancreatitis.  I suspect that this is a malignant effusion from his known leukemia versus from his chemotherapy agent.  I recommended admission to the hospital service for further workup and management including evaluation of transudative versus exudative pleural effusion and management with oncology.  Patient and his wife are in agreement with the plan.  Case was discussed with Dr. Cyril Loosen who agrees with assessment and plan.  Patient's presentation is most consistent with acute presentation with potential threat to life or bodily function.   Clinical Course as of 06/20/23 1503  Fri Jun 20, 2023  1109 Radiology tech called to say "patient's abdomen and chest is full of fluid."  CT chest added [JP]    Clinical Course User Index [JP] Lakendra Helling, Herb Grays, PA-C     FINAL CLINICAL IMPRESSION(S) / ED DIAGNOSES   Final diagnoses:  Pleural effusion, bilateral  Hyperglycemia     Rx / DC Orders   ED Discharge Orders     None        Note:  This document was prepared using Dragon voice recognition software and may include unintentional dictation errors.   Keturah Shavers 06/20/23 1503    Jene Every, MD 06/20/23 [phone]

## 2023-06-20 NOTE — Procedures (Signed)
PROCEDURE SUMMARY:  Successful US guided right thoracentesis. Yielded 1.4 L of dark yellow fluid. Pt tolerated procedure well. No immediate complications.  Specimen was sent for labs. CXR ordered.  EBL < 5 mL  Hoyt Koch PA-C 06/20/2023 4:46 PM

## 2023-06-20 NOTE — Progress Notes (Signed)
*  PRELIMINARY RESULTS* Echocardiogram 2D Echocardiogram has been performed.  Carolyne Fiscal 06/20/2023, 3:38 PM

## 2023-06-20 NOTE — H&P (Signed)
Pleural effusions. History and Physical    Patient: Frank [name] UJW:[id number] DOB: 10/15/1961 DOA: 06/20/2023 DOS: the patient was seen and examined on 06/20/2023 PCP: System, Provider Not In  Patient coming from: Home  Chief Complaint:  Chief Complaint  Patient presents with   Abdominal Pain   HPI: Frank [name] is a 62 y.o. 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  Patient has been having left upper quadrant abdominal discomfort, epigastric discomfort radiating to his back since last Wednesday.  Denies any nausea, vomiting.  No diarrhea or constipation.  No fever chills or rigors.  Denies chest pain, has mild shortness of breath.  States deep inspiration worsens the pain.  Denies any change in appetite, weight loss.  No sick contacts.  Patient attributes his symptoms to his chronic pancreatitis.    Upon arrival to the emergency department patient is noted to have BP of 160/98, saturating 98% on room air, remains afebrile. Laboratory findings showed normal CBC, blood sugars of 235.  CT abdomen pelvis done revealed large bilateral pleural effusions adjacent opacities.  Atrophic Pancreas with multiple calcifications.  ED provider requested hospitalist admission for further management evaluation of abdominal discomfort, bilateral pleural effusions.  Review of Systems: As mentioned in the history of present illness. All other systems reviewed and are negative. 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 No pertinent surgical history. Social History:  reports that he has been smoking cigarettes. He has never used smokeless tobacco. He reports current alcohol use. He reports current drug use. Drugs: Cocaine and Marijuana.  No Known Allergies  Family History  Problem Relation Age of Onset   Diabetes Mother    Hypertension Mother    Heart failure Mother    Hyperlipidemia Mother    Thyroid disease Mother    Alcohol abuse Father    Emphysema Father    Kidney failure Father    Gout Father    Cancer Sister    Hyperlipidemia Sister    Hypertension Sister    Rheum arthritis Sister    Fibromyalgia Sister    Thyroid disease Sister     Prior to Admission medications   Medication Sig Start Date End Date Taking? Authorizing Provider  aspirin 81 MG chewable tablet Chew 81 mg by mouth daily. 04/03/23  Yes [provider]  Azelastine HCl 137 MCG/SPRAY SOLN Place 1 spray into the nose 2 (two) times daily as needed. 06/04/23  Yes [provider]  cetirizine (ZYRTEC) 10 MG tablet Take 10 mg by mouth at bedtime. 05/20/23  Yes [provider]  dorzolamide-timolol (COSOPT) 2-0.5 % ophthalmic solution Place 1 drop into both eyes 2 (two) times daily. 07/24/22  Yes [provider]  rosuvastatin (CRESTOR) 10 MG tablet Take 10 mg by mouth at bedtime. 05/01/23  Yes [provider]  amLODipine (NORVASC) 10 MG tablet Take 0.5 tablets by mouth daily. 12/06/19   [provider]  aspirin 81 MG EC tablet  Take 1 tablet by mouth daily. With a meal 01/17/20   [provider]  Brinzolamide-Brimonidine 1-0.2 % SUSP Place 1 drop into both eyes in the morning, at noon, and at bedtime. 05/31/20   [provider]  dasatinib (SPRYCEL) 70 MG tablet Take 1 tablet (70 mg total) by mouth daily. 03/31/23   Earna Coder, MD  ergocalciferol (VITAMIN D2) 1.25 MG (50000 UT) capsule Take 1 capsule (50,000 Units total) by mouth once  a week. 05/08/23   Earna Coder, MD  fluticasone (FLONASE) 50 MCG/ACT nasal spray INSTILL 1 SPRAY INTO EACH NOSTRIL EVERY DAY AS NEEDED MAXIMUM 2 SPRAYS IN EACH NOSTRIL DAILY. FOR ALLERGIES 08/01/21   [provider]  guaiFENesin (MUCINEX) 600 MG 12 hr tablet TAKE ONE TABLET BY MOUTH TWO TIMES A DAY AS NEEDED FOR SINUS CONGESTION Patient not taking: Reported on 04/28/2023 08/01/21   [provider]  insulin aspart (NOVOLOG) 100 UNIT/ML injection Inject 100 Units into the skin continuous. INJECT 100 UNITS UNDER SKIN CONTINUOUS-VIA-PUMP AS DIRECTED FOR DIABETES.DISCARD BOTTLE 28 DAYS AFTER OPENING 01/10/15   [provider]  insulin glargine (LANTUS) 100 UNIT/ML injection 12 Units at bedtime. 01/01/21   [provider]  latanoprost (XALATAN) 0.005 % ophthalmic solution Place 1 drop into both eyes at bedtime. 06/07/16   [provider]  lipase/protease/amylase (CREON) 36000 UNITS CPEP capsule See admin instructions. TAKE 2 CAPSULES BY MOUTH WITH ACTIVE MEALS AND TAKE 1 CAPSULE WITH SNACK 03/30/20   [provider]  lisinopril-hydrochlorothiazide (ZESTORETIC) 20-12.5 MG tablet Take 1 tablet by mouth daily. 05/24/14   [provider]  methocarbamol (ROBAXIN) 500 MG tablet Take 1 tablet (500 mg total) by mouth every 8 (eight) hours as needed for muscle spasms. 02/20/21   Edward Jolly, MD  nicotine (NICODERM CQ - DOSED IN MG/24 HOURS) 14 mg/24hr patch Place 14 mg onto the skin daily. APPLY ONE PATCH TO SKIN EVERY DAY ACTIVE APPLY ONE PATCH WHEN YOU WAKE UP AND REMOVE THE OLD PATCH. PEEL THE BACK OFF THE PATCH AND PUT IT ON CLEAN,DRY, HAIR-FREE SKIN ON THE UPPER ARM, CHEST OR BACK Patient not taking: Reported on 04/28/2023 03/13/20   [provider]  nicotine polacrilex (COMMIT) 4 MG lozenge Take 4 mg by mouth as needed. DISSOLVE 1 LOZENGE BY MOUTH AS DIRECTED - USE AT LEAST 8-10 LOZENGES EACH DAY TO START. DO NOT USE MORE THAN 20  LOZENGES Patient not taking: Reported on 04/28/2023 01/01/21   [provider]  ondansetron (ZOFRAN) 8 MG tablet TAKE ONE TABLET BY MOUTH 30-45 MINS PRIOR TO TAKING YOUR CHEMO TO PREVENT NAUSEA Patient not taking: Reported on 04/28/2023 01/20/23   Earna Coder, MD  pregabalin (LYRICA) 50 MG capsule Take 1 capsule (50 mg total) by mouth 3 (three) times daily. 02/20/21   Edward Jolly, MD  prochlorperazine (COMPAZINE) 10 MG tablet Take 1 pill 30 to 45 minutes prior to taking your cancer pill. 04/28/23   Earna Coder, MD  rosuvastatin (CRESTOR) 20 MG tablet Take 10 mg by mouth daily. Patient not taking: Reported on 06/20/2023 01/01/21   [provider]  sodium chloride (OCEAN) 0.65 % nasal spray SPRAY 2 SPRAYS INTO EACH NOSTRIL FOUR TIMES A DAY FOR SINUS CONGESTION 08/01/21   [provider]  timolol (TIMOPTIC) 0.5 % ophthalmic solution Place 1 drop into both eyes 2 (two) times daily. 04/05/20   [provider]    Physical Exam: Vitals:   06/20/23 0820 06/20/23 1610 06/20/23 1306  BP: (!) 156/81  (!) 160/98  Pulse: 67  68  Resp: 18  18  Temp: 97.7 F (36.5 C)  97.9 F (36.6 C)  TempSrc: Oral    SpO2: 98%  100%  Weight:  77.7 kg   Height:  5\' 8"  (1.727 m)    General - Elderly African American male, in distress due to pain HEENT - PERRLA, EOMI, atraumatic head, non tender sinuses. Lung - Clear, rales, rhonchi, wheezes. Heart - S1, S2 heard, no murmurs, rubs, no pedal edema Neuro - Alert, awake and oriented x 3, non focal exam. Skin - Warm and dry. Data Reviewed:     Latest Ref Rng & Units 06/20/2023    8:38 AM 04/28/2023    9:38 AM 12/25/2022    9:10 AM  CBC  WBC 4.0 - 10.5 K/uL 8.0  7.4  6.8   Hemoglobin 13.0 - 17.0 g/dL 62.1  30.8  65.7   Hematocrit 39.0 - 52.0 % 41.4  43.3  44.0   Platelets 150 - 400 K/uL 164  197  188        Latest Ref Rng & Units 06/20/2023    9:42 AM 04/28/2023    9:39 AM 12/25/2022    9:10 AM  BMP  Glucose 70 - 99  mg/dL 846  962  952   BUN 8 - 23 mg/dL 17  34  18   Creatinine 0.61 - 1.24 mg/dL 8.41  3.24  4.01   Sodium 135 - 145 mmol/L 135  131  131   Potassium 3.5 - 5.1 mmol/L 3.6  4.7  4.2   Chloride 98 - 111 mmol/L 106  103  100   CO2 22 - 32 mmol/L 21  22  21    Calcium 8.9 - 10.3 mg/dL 8.9  9.4  8.8     CT chest, abdomen, pelvis -large paratracheal effusions, adjacent ascites, atrophic pancreas with multiple punctate calcifications.   Assessment and Plan: Frank [name] is a 62 y.o. 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  Patient was noted to have large bilateral pleural effusions, atrophic pancreas with punctate calcifications on the CT chest abdomen pelvis.  Patient will need inpatient admission and further management accordingly.  Large bilateral Pleural effusions- Admit the patient to MedSurg floor, telemetry service. Ultrasound-guided paracentesis ordered. Fluid analysis orders placed including cytology. Effusions likely secondary to Dasatinib, chronic pancreatitis, CHF, liver failure. Discussed with oncology team advised to stop Dasatinib. Check BNP, echocardiogram. Liver functions within normal limits. Continue to monitor saturations closely.  Oxygen as needed.  Chronic pancreatitis- Patient's symptoms of epigastric pain radiating to back likely due to his chronic pancreatitis. Patient is started on Dilaudid therapy for pain. Patient drinks 2-3 beers per day.  Advised to quit.  Type 2 Diabetes mellitus- Patient is on insulin pump which is removed.  Sensor not working. Blood sugars in 200s. Will start him on long-acting 12 units at night. Continue Accu-Cheks, sliding scale insulin as per protocol. Carb consistent diet.  CML on oral chemotherapy- Patient is on  Dasatinib therapy at home which is held due to pleural effusions Oncology team evaluation called.  Hypertension-. Home dose amlodipine, lisinopril ordered. Hydralazine as needed for high blood pressures.  Hyperlipidemia- Continue home statin therapy  Active smoker- States he smokes 2-3 cig/ day. Advised to quit smoking. Nicotine patch  ordered.   Advance Care Planning:   Code Status: Full Code I discussed with him the CODE STATUS, he wishes to be full code. His Girl friend Aram Beecham is his POA.  Consults: Oncology.  Family Communication: Aram Beecham will be notified of any additional work up of change sin current plan.  Severity of Illness: The appropriate patient status for this patient is INPATIENT. Inpatient status is judged to be reasonable and necessary in order to provide the required intensity of service to ensure the patient's safety. The patient's presenting symptoms, physical exam findings, and initial radiographic and laboratory data in the context of their chronic comorbidities is felt to place them at high risk for further clinical deterioration. Furthermore, it is not anticipated that the patient will be medically stable for discharge from the hospital within 2 midnights of admission.   * I certify that at the point of admission it is my clinical judgment that the patient will require inpatient hospital care spanning beyond 2 midnights from the point of admission due to high intensity of service, high risk for further deterioration and high frequency of surveillance required.*  Author: Marcelino Duster, MD 06/20/2023 2:14 PM  For on call review [email or web address].

## 2023-06-21 ENCOUNTER — Encounter: Payer: Self-pay | Admitting: Internal Medicine

## 2023-06-21 DIAGNOSIS — N182 Chronic kidney disease, stage 2 (mild): Secondary | ICD-10-CM | POA: Diagnosis not present

## 2023-06-21 DIAGNOSIS — E1122 Type 2 diabetes mellitus with diabetic chronic kidney disease: Secondary | ICD-10-CM | POA: Diagnosis not present

## 2023-06-21 DIAGNOSIS — E782 Mixed hyperlipidemia: Secondary | ICD-10-CM | POA: Diagnosis not present

## 2023-06-21 DIAGNOSIS — J90 Pleural effusion, not elsewhere classified: Secondary | ICD-10-CM | POA: Diagnosis not present

## 2023-06-21 LAB — GLUCOSE, CAPILLARY
Glucose-Capillary: 211 mg/dL — ABNORMAL HIGH (ref 70–99)
Glucose-Capillary: 248 mg/dL — ABNORMAL HIGH (ref 70–99)
Glucose-Capillary: 112 mg/dL — ABNORMAL HIGH (ref 70–99)
Glucose-Capillary: 269 mg/dL — ABNORMAL HIGH (ref 70–99)

## 2023-06-21 LAB — AMYLASE, PLEURAL OR PERITONEAL FLUID: Amylase, Fluid: 41 U/L

## 2023-06-21 LAB — LACTATE DEHYDROGENASE: LDH: 186 U/L (ref 98–192)

## 2023-06-21 MED ORDER — INSULIN GLARGINE-YFGN 100 UNIT/ML ~~LOC~~ SOLN
15.0000 [IU] | Freq: Every day | SUBCUTANEOUS | Status: DC
  Administered 2023-06-21: 15 [IU] via SUBCUTANEOUS
  Filled 2023-06-21 (×2): qty 0.15

## 2023-06-21 MED ORDER — NICOTINE 21 MG/24HR TD PT24
21.0000 mg | MEDICATED_PATCH | Freq: Every day | TRANSDERMAL | Status: DC
  Administered 2023-06-21 – 2023-06-23 (×3): 21 mg via TRANSDERMAL
  Filled 2023-06-21 (×3): qty 1

## 2023-06-21 MED ORDER — AMLODIPINE BESYLATE 10 MG PO TABS
10.0000 mg | ORAL_TABLET | Freq: Every day | ORAL | Status: DC
  Administered 2023-06-22 – 2023-06-23 (×2): 10 mg via ORAL
  Filled 2023-06-21 (×2): qty 1

## 2023-06-21 MED ORDER — INSULIN ASPART 100 UNIT/ML IJ SOLN
10.0000 [IU] | Freq: Three times a day (TID) | INTRAMUSCULAR | Status: DC
  Administered 2023-06-22 (×2): 10 [IU] via SUBCUTANEOUS
  Filled 2023-06-21 (×2): qty 1

## 2023-06-21 MED ORDER — ALUM & MAG HYDROXIDE-SIMETH 200-200-20 MG/5ML PO SUSP
30.0000 mL | Freq: Four times a day (QID) | ORAL | Status: DC | PRN
  Administered 2023-06-21: 30 mL via ORAL
  Filled 2023-06-21: qty 30

## 2023-06-21 NOTE — Progress Notes (Signed)
Progress Note   Patient: Frank [name] UVO:[id number] DOB: [birth year] DOA: 06/20/2023     1 DOS: the patient was seen and examined on 06/21/2023   Brief hospital course: KAIHAN DIMARCO is a 62 y.o. 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  Patient was noted to have large bilateral pleural effusions, atrophic pancreas with punctate calcifications on the CT chest abdomen pelvis.  Patient will need inpatient admission and further management accordingly.   Assessment and Plan: Large bilateral Pleural effusions- BNP, echocardiogram normal limits Liver functions within normal limits. Continue to monitor saturations closely.  Oxygen as needed. Ultrasound-guided paracentesis with 1.4L fluid removed from right side. Fluid analysis possible exudate due to malignancy, chronic pancreatitis. Will follow cultures. Stop Dasatinib.   Chronic pancreatitis- Patient's symptoms of epigastric pain radiating to back likely due to his chronic pancreatitis. Patient is started on Dilaudid therapy for pain. Patient drinks 2-3 beers per day.  Advised to quit.   Type 2 Diabetes mellitus- Patient is on insulin pump which is removed.  Sensor not working. Blood sugars in 200s. Semglee increased to 15 units at night, started aspart 10 units with meals Continue Accu-Cheks, sliding scale insulin as per protocol. Carb consistent diet.   CML on oral chemotherapy- Patient is on Dasatinib therapy at home which is held due to pleural effusions Oncology team evaluation called.   Hypertension-. BP high, increased amlodipine to 10mg , continue lisinopril. Hydralazine as needed for high blood pressures.   Hyperlipidemia- Continue home statin therapy   Active smoker- Smokes 2-3 cig/ day. Advised to  quit smoking. Nicotine patch ordered.       Subjective: Patient is seen and examined today morning.  He is lying comfortably states breathing is better.  Eating poor.  Of bed to chair, incentive spirometry.  Physical Exam: Vitals:   06/20/23 1924 06/21/23 0523 06/21/23 0810 06/21/23 1537  BP: (!) 158/79 (!) 177/83 (!) 162/87 (!) 149/86  Pulse: 62 (!) 58 (!) 51 (!) 58  Resp: 18 20 18 18   Temp: 98.9 F (37.2 C) 97.7 F (36.5 C) 97.8 F (36.6 C) 97.9 F (36.6 C)  TempSrc: Oral Oral Oral Oral  SpO2: 95% 99% 99% 99%  Weight:      Height:       General - Elderly African American male, no acute distress HEENT - PERRLA, EOMI, atraumatic head, non tender sinuses. Lung - Clear, rales, rhonchi, wheezes. Heart - S1, S2 heard, no murmurs, rubs, no pedal edema Neuro - Alert, awake and oriented x 3, non focal exam. Skin - Warm and dry. Data Reviewed:     Latest Ref Rng & Units 06/21/2023    7:30 AM 06/20/2023    8:38 AM 04/28/2023    9:38 AM  CBC  WBC 4.0 - 10.5 K/uL 6.5  8.0  7.4   Hemoglobin 13.0 - 17.0 g/dL 74.2  59.5  63.8   Hematocrit 39.0 - 52.0 % 42.4  41.4  43.3   Platelets 150 - 400 K/uL 156  164  197        Latest Ref Rng & Units 06/21/2023    7:30 AM 06/20/2023    9:42 AM 04/28/2023    9:39 AM  BMP  Glucose 70 - 99 mg/dL 756  433  295   BUN 8 - 23 mg/dL 18  17  34   Creatinine 0.61 - 1.24 mg/dL 9.62  9.52  8.41   Sodium 135 - 145 mmol/L 132  135  131   Potassium 3.5 - 5.1 mmol/L 3.7  3.6  4.7   Chloride 98 - 111 mmol/L 101  106  103   CO2 22 - 32 mmol/L 20  21  22    Calcium 8.9 - 10.3 mg/dL 9.1  8.9  9.4      Family Communication: Patient understands and agrees with the above care plan  Disposition: Status is: Inpatient Remains inpatient appropriate because: Pleural effusion fluid analysis, oncology workup  Planned Discharge Destination: Home    Time spent: 44 minutes  Author: Marcelino Duster, MD 06/21/2023 4:24 PM  For on call review  [email or web address].

## 2023-06-21 NOTE — Plan of Care (Signed)

## 2023-06-21 NOTE — Consult Note (Signed)
[facility] CONSULT NOTE  Patient Care Team: System, Provider Not In as PCP - General Earna Coder, MD as Consulting Physician (Oncology)  CHIEF COMPLAINTS/PURPOSE OF CONSULTATION: History of CML; bilateral pleural effusion  HISTORY OF PRESENTING ILLNESS:  Frank [name] 62 y.o.  male with multiple medical problems including chronic myeloleukemia-chronic phase on Dasatinib; COPD alcohol abuse type 2 diabetes on insulin pump chronic pancreatitis-was admitted to the hospital for worsening shortness of breath and also cough.  Patient denies any chest pain.  Denies any nausea vomiting fevers or chills.  On imaging-CT scan showed bilateral large pleural effusion with adjacent opacities.  Patient was saturating 96% room air.  Afebrile.  Patient was subsequently evaluated by hospitalist service; currently s/p thoracentesis.  Oncology has been consulted for further evaluation recommendations.   Patient currently is resting comfortably.  States his breathing is improved.  States his cough is improved.  Review of Systems  Constitutional:  Positive for malaise/fatigue. Negative for chills, diaphoresis, fever and weight loss.  HENT:  Negative for nosebleeds and sore throat.   Eyes:  Negative for double vision.  Respiratory:  Positive for cough and shortness of breath. Negative for hemoptysis, sputum production and wheezing.   Cardiovascular:  Negative for chest pain, palpitations, orthopnea and leg swelling.  Gastrointestinal:  Negative for abdominal pain, blood in stool, diarrhea, heartburn, melena, nausea and vomiting.  Genitourinary:  Negative for dysuria, frequency and urgency.  Musculoskeletal:  Negative for back pain and joint pain.  Skin: Negative.  Negative for itching and rash.  Neurological:  Negative for dizziness, tingling, focal weakness, weakness and headaches.  Endo/Heme/Allergies:  Does not bruise/bleed easily.  Psychiatric/Behavioral:  Negative for  depression. The patient is not nervous/anxious and does not have insomnia.     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 No pertinent surgical history.  SOCIAL HISTORY: Social History   Socioeconomic History   Marital status: Single    Spouse name: Not on file   Number of children: Not on file   Years of education: Not on file   Highest education level: Not on file  Occupational History   Occupation: retired     Comment: custodian   Tobacco Use   Smoking status: Some Days    Types: Cigarettes   Smokeless tobacco: Never   Tobacco comments:    4 cigarettes yesterday   Vaping Use   Vaping status: Never Used  Substance and Sexual Activity   Alcohol use: Yes    Comment: hx of ETOH abuse   Drug use: Yes    Types: Cocaine, Marijuana    Comment: had cocaine Sat. (2 days ago)   Sexual activity: Not on file  Other Topics Concern   Not on file  Social History Narrative   Lives with S.O. [name]    Social Determinants of Health   Financial Resource Strain: Not on file  Food Insecurity: No Food Insecurity (06/20/2023)   Hunger Vital Sign    Worried About Running Out of Food in the  Last Year: Never true    Ran Out of Food in the Last Year: Never true  Transportation Needs: No Transportation Needs (06/20/2023)   PRAPARE - Administrator, Civil Service (Medical): No    Lack of Transportation (Non-Medical): No  Physical Activity: Not on file  Stress: Not on file  Social Connections: Not on file  Intimate Partner Violence: Not At Risk (06/20/2023)   Humiliation, Afraid, Rape, and Kick  questionnaire    Fear of Current or Ex-Partner: No    Emotionally Abused: No    Physically Abused: No    Sexually Abused: No    FAMILY HISTORY: Family History  Problem Relation Age of Onset   Diabetes Mother    Hypertension Mother    Heart failure Mother    Hyperlipidemia Mother    Thyroid disease Mother    Alcohol abuse Father    Emphysema Father    Kidney failure Father    Gout Father    Cancer Sister    Hyperlipidemia Sister    Hypertension Sister    Rheum arthritis Sister    Fibromyalgia Sister    Thyroid disease Sister     ALLERGIES:  has No Known Allergies.  MEDICATIONS:  Current Facility-Administered Medications  Medication Dose Route Frequency Provider Last Rate Last Admin   acetaminophen (TYLENOL) tablet 650 mg  650 mg Oral Q6H PRN Marcelino Duster, MD       Or   acetaminophen (TYLENOL) suppository 650 mg  650 mg Rectal Q6H PRN Marcelino Duster, MD       alum & mag hydroxide-simeth (MAALOX/MYLANTA) 200-200-20 MG/5ML suspension 30 mL  30 mL Oral Q6H PRN Marcelino Duster, MD   30 mL at 06/21/23 2205   [START ON 06/22/2023] amLODipine (NORVASC) tablet 10 mg  10 mg Oral Daily Sreeram, Lynne Logan, MD       bisacodyl (DULCOLAX) suppository 10 mg  10 mg Rectal Daily PRN Marcelino Duster, MD       dorzolamide-timolol (COSOPT) 2-0.5 % ophthalmic solution 1 drop  1 drop Both Eyes BID Marcelino Duster, MD   1 drop at 06/21/23 2159   enoxaparin (LOVENOX) injection 40 mg  40 mg Subcutaneous Q24H Marcelino Duster, MD   40 mg at 06/21/23 2156   fluticasone (FLONASE) 50 MCG/ACT nasal spray 1 spray  1 spray Each Nare Daily Marcelino Duster, MD   1 spray at 06/20/23 1513   guaiFENesin (MUCINEX) 12 hr tablet 600 mg  600 mg Oral BID Marcelino Duster, MD   600 mg at 06/21/23 2158   hydrALAZINE (APRESOLINE) injection 10 mg  10 mg Intravenous Q6H PRN Marcelino Duster, MD       lisinopril (ZESTRIL) tablet 20 mg  20 mg Oral Daily Drusilla Kanner, RPH   20 mg at 06/21/23 4098   And   hydrochlorothiazide (HYDRODIURIL) tablet 12.5 mg  12.5 mg Oral Daily Drusilla Kanner, RPH   12.5 mg at 06/21/23 1191   HYDROmorphone (DILAUDID) injection 0.5 mg  0.5 mg Intravenous Q3H PRN Marcelino Duster, MD   0.5 mg at 06/21/23 2200   insulin aspart (novoLOG) injection 0-15 Units  0-15 Units Subcutaneous TID WC Marcelino Duster, MD   5 Units at 06/21/23 1205   insulin aspart (novoLOG) injection 0-5 Units  0-5 Units Subcutaneous QHS Marcelino Duster, MD   3 Units at 06/21/23 2155   [START ON 06/22/2023] insulin aspart (novoLOG) injection 10 Units  10 Units Subcutaneous TID WC Marcelino Duster, MD  insulin glargine-yfgn (SEMGLEE) injection 15 Units  15 Units Subcutaneous QHS Marcelino Duster, MD   15 Units at 06/21/23 2155   latanoprost (XALATAN) 0.005 % ophthalmic solution 1 drop  1 drop Both Eyes QHS Sreeram, Lynne Logan, MD   1 drop at 06/21/23 2159   nicotine (NICODERM CQ - dosed in mg/24 hours) patch 21 mg  21 mg Transdermal Daily Louretta Shorten R, MD   21 mg at 06/21/23 1204   ondansetron (ZOFRAN) tablet 4 mg  4 mg Oral Q8H PRN Marcelino Duster, MD   4 mg at 06/21/23 2157   pregabalin (LYRICA) capsule 50 mg  50 mg Oral TID Marcelino Duster, MD   50 mg at 06/21/23 2157   rosuvastatin (CRESTOR) tablet 10 mg  10 mg Oral Daily Marcelino Duster, MD   10 mg at 06/21/23 0833   [START ON 06/23/2023] Vitamin D (Ergocalciferol) (DRISDOL) 1.25 MG (50000 UNIT) capsule 50,000 Units  50,000 Units Oral Weekly Marcelino Duster, MD        PHYSICAL EXAMINATION:   Vitals:   06/21/23 1537 06/21/23 2057  BP: (!) 149/86 136/86  Pulse: (!) 58 66  Resp: 18 20  Temp: 97.9 F (36.6 C) 97.8 F (36.6 C)  SpO2: 99% 98%   Filed Weights   06/20/23 0821  Weight: 171 lb 4.8 oz (77.7 kg)    Physical Exam Vitals and nursing note reviewed.  HENT:     Head: Normocephalic and atraumatic.     Mouth/Throat:      Pharynx: Oropharynx is clear.  Eyes:     Extraocular Movements: Extraocular movements intact.     Pupils: Pupils are equal, round, and reactive to light.  Cardiovascular:     Rate and Rhythm: Normal rate and regular rhythm.  Pulmonary:     Comments: Decreased breath sounds bilaterally.  Abdominal:     Palpations: Abdomen is soft.  Musculoskeletal:        General: Normal range of motion.     Cervical back: Normal range of motion.  Skin:    General: Skin is warm.  Neurological:     General: No focal deficit present.     Mental Status: He is alert and oriented to person, place, and time.  Psychiatric:        Behavior: Behavior normal.        Judgment: Judgment normal.     LABORATORY DATA:  I have reviewed the data as listed Lab Results  Component Value Date   WBC 6.5 06/21/2023   HGB 14.5 06/21/2023   HCT 42.4 06/21/2023   MCV 89.8 06/21/2023   PLT 156 06/21/2023   Recent Labs    12/25/22 0910 04/28/23 0939 06/20/23 0942 06/21/23 0730  NA 131* 131* 135 132*  K 4.2 4.7 3.6 3.7  CL 100 103 106 101  CO2 21* 22 21* 20*  GLUCOSE 245* 277* 235* 198*  BUN 18 34* 17 18  CREATININE 1.31* 1.41* 0.94 0.93  CALCIUM 8.8* 9.4 8.9 9.1  GFRNONAA >60 56* >60 >60  PROT 7.7 8.0 7.6  --   ALBUMIN 4.0 4.1 3.9  --   AST 20 21 21   --   ALT 16 19 21   --   ALKPHOS 67 71 62  --   BILITOT 0.6 0.5 0.4  --   BILIDIR  --   --  <0.1  --   IBILI  --   --  NOT CALCULATED  --     RADIOGRAPHIC STUDIES: I have personally reviewed  the radiological images as listed and agreed with the findings in the report. ECHOCARDIOGRAM COMPLETE  Result Date: 06/20/2023    ECHOCARDIOGRAM REPORT   Patient Name:   Frank [name] Date of Exam: 06/20/2023 Medical Rec #:  [id number]      Height:       68.0 in Accession #:    [id number]     Weight:       171.3 lb Date of Birth:  [birth year]      BSA:          1.914 m Patient Age:    61 years       BP:           160/98 mmHg Patient Gender: M              HR:            62 bpm. Exam Location:  [facility] Procedure: 2D Echo, Cardiac Doppler and Color Doppler Indications:     Dyspnea  History:         Patient has no prior history of Echocardiogram examinations.                  Signs/Symptoms:Dyspnea; Risk Factors:Hypertension, Diabetes and                  Dyslipidemia. CKD, Pleural effusion.  Sonographer:     Mikki Harbor Referring Phys:  [id number] Marcelino Duster Diagnosing Phys: Julien Nordmann MD  Sonographer Comments: Technically difficult study due to poor echo windows. Image acquisition challenging due to respiratory motion. IMPRESSIONS  1. Left ventricular ejection fraction, by estimation, is 60 to 65%. The left ventricle has normal function. The left ventricle has no regional wall motion abnormalities. Left ventricular diastolic parameters are consistent with Grade I diastolic dysfunction (impaired relaxation).  2. Right ventricular systolic function is normal. The right ventricular size is normal. There is normal pulmonary artery systolic pressure.  3. The mitral valve is normal in structure. No evidence of mitral valve regurgitation. No evidence of mitral stenosis.  4. The aortic valve is normal in structure. Aortic valve regurgitation is not visualized. Aortic valve sclerosis is present, with no evidence of aortic valve stenosis.  5. The inferior vena cava is normal in size with greater than 50% respiratory variability, suggesting right atrial pressure of 3 mmHg. FINDINGS  Left Ventricle: Left ventricular ejection fraction, by estimation, is 60 to 65%. The left ventricle has normal function. The left ventricle has no regional wall motion abnormalities. The left ventricular internal cavity size was normal in size. There is  no left ventricular hypertrophy. Left ventricular diastolic parameters are consistent with Grade I diastolic dysfunction (impaired relaxation). Right Ventricle: The right ventricular size is normal. No increase in right ventricular wall thickness.  Right ventricular systolic function is normal. There is normal pulmonary artery systolic pressure. The tricuspid regurgitant velocity is 2.20 m/s, and  with an assumed right atrial pressure of 3 mmHg, the estimated right ventricular systolic pressure is 22.4 mmHg. Left Atrium: Left atrial size was normal in size. Right Atrium: Right atrial size was normal in size. Pericardium: There is no evidence of pericardial effusion. Mitral Valve: The mitral valve is normal in structure. There is mild calcification of the mitral valve leaflet(s). No evidence of mitral valve regurgitation. No evidence of mitral valve stenosis. MV peak gradient, 3.6 mmHg. The mean mitral valve gradient  is 1.0 mmHg. Tricuspid Valve: The tricuspid valve is normal in structure. Tricuspid valve regurgitation is not  demonstrated. No evidence of tricuspid stenosis. Aortic Valve: The aortic valve is normal in structure. Aortic valve regurgitation is not visualized. Aortic valve sclerosis is present, with no evidence of aortic valve stenosis. Aortic valve mean gradient measures 3.0 mmHg. Aortic valve peak gradient measures 5.5 mmHg. Aortic valve area, by VTI measures 2.05 cm. Pulmonic Valve: The pulmonic valve was normal in structure. Pulmonic valve regurgitation is not visualized. No evidence of pulmonic stenosis. Aorta: The aortic root is normal in size and structure. Venous: The inferior vena cava is normal in size with greater than 50% respiratory variability, suggesting right atrial pressure of 3 mmHg. IAS/Shunts: No atrial level shunt detected by color flow Doppler.  LEFT VENTRICLE PLAX 2D LVIDd:         4.00 cm   Diastology LVIDs:         2.20 cm   LV e' medial:    7.94 cm/s LV PW:         0.80 cm   LV E/e' medial:  9.4 LV IVS:        0.90 cm   LV e' lateral:   7.51 cm/s LVOT diam:     1.90 cm   LV E/e' lateral: 10.0 LV SV:         67 LV SV Index:   35 LVOT Area:     2.84 cm  RIGHT VENTRICLE RV Basal diam:  3.00 cm RV Mid diam:    2.80 cm RV S  prime:     11.50 cm/s TAPSE (M-mode): 2.4 cm LEFT ATRIUM           Index        RIGHT ATRIUM           Index LA diam:      2.90 cm 1.52 cm/m   RA Area:     13.80 cm LA Vol (A2C): 34.6 ml 18.08 ml/m  RA Volume:   37.40 ml  19.55 ml/m LA Vol (A4C): 50.2 ml 26.23 ml/m  AORTIC VALVE                    PULMONIC VALVE AV Area (Vmax):    2.45 cm     PV Vmax:       1.15 m/s AV Area (Vmean):   2.34 cm     PV Peak grad:  5.3 mmHg AV Area (VTI):     2.05 cm AV Vmax:           117.00 cm/s AV Vmean:          82.100 cm/s AV VTI:            0.329 m AV Peak Grad:      5.5 mmHg AV Mean Grad:      3.0 mmHg LVOT Vmax:         101.00 cm/s LVOT Vmean:        67.900 cm/s LVOT VTI:          0.238 m LVOT/AV VTI ratio: 0.72  AORTA Ao Root diam: 3.60 cm MITRAL VALVE               TRICUSPID VALVE MV Area (PHT): 3.33 cm    TR Peak grad:   19.4 mmHg MV Area VTI:   2.58 cm    TR Vmax:        220.00 cm/s MV Peak grad:  3.6 mmHg MV Mean grad:  1.0 mmHg    SHUNTS MV Vmax:  0.95 m/s    Systemic VTI:  0.24 m MV Vmean:      55.3 cm/s   Systemic Diam: 1.90 cm MV Decel Time: 228 msec MV E velocity: 74.90 cm/s MV A velocity: 79.70 cm/s MV E/A ratio:  0.94 Julien Nordmann MD Electronically signed by Julien Nordmann MD Signature Date/Time: 06/20/2023/5:36:16 PM    Final    DG Chest Port 1 View  Result Date: 06/20/2023 CLINICAL DATA:  Pleural effusion, status post thoracentesis EXAM: PORTABLE CHEST 1 VIEW COMPARISON:  CT done on 06/20/2023 FINDINGS: There is significant interval decrease in right pleural effusion. There is small residual right pleural effusion. There is moderate left pleural effusion. There is no demonstrable apical pneumothorax. Cardiac size is within normal limits. There are no signs of pulmonary edema. There are patchy infiltrates in left mid and both lower lung fields. IMPRESSION: There is significant interval decrease in right pleural effusion. There is moderate left pleural effusion. Infiltrates are seen in left mid  and both lower lung fields suggesting atelectasis/pneumonia. There is no demonstrable pneumothorax. Electronically Signed   By: Ernie Avena M.D.   On: 06/20/2023 16:54   CT ABDOMEN PELVIS W CONTRAST  Result Date: 06/20/2023 CLINICAL DATA:  Pleural effusion. Malignancy suspected. History of CML. * Tracking Code: BO * EXAM: CT CHEST without contrast CT ABDOMEN AND PELVIS WITH CONTRAST TECHNIQUE: Multidetector CT imaging of the chest, abdomen and pelvis was performed. Chest without contrast. Followin abdomen and pelvis CT following g the standard protocol during bolus administration of intravenous contrast. RADIATION DOSE REDUCTION: This exam was performed according to the departmental dose-optimization program which includes automated exposure control, adjustment of the mA and/or kV according to patient size and/or use of iterative reconstruction technique. CONTRAST:  OMNIPAQUE IOHEXOL 300 MG/ML  SOLN COMPARISON:  CT angiogram 01/21/2021. abdomen pelvis CT 09/13/2014 FINDINGS: CT CHEST FINDINGS Cardiovascular: Coronary artery calcifications are seen. Please correlate for other coronary risk factors. Trace pericardial fluid. The heart is nonenlarged. On this non IV contrast exam the thoracic aorta has a normal course and caliber. Mediastinum/Nodes: No specific abnormal lymph node enlargement identified in the axillary regions, hilum or mediastinum. There are a few small less than 1 cm size nodes identified, nonpathologic by size criteria. Normal caliber thoracic esophagus. Preserved thyroid gland. Lungs/Pleura: Large bilateral pleural effusions. Adjacent parenchymal opacities. No pneumothorax. Musculoskeletal: Mild degenerative changes along the spine. CT ABDOMEN PELVIS FINDINGS Hepatobiliary: No focal liver abnormality is seen. No gallstones, gallbladder wall thickening, or biliary dilatation. Pancreas: The pancreas has multiple punctate calcifications, parenchymal atrophy and dilated pancreatic  duct. Please correlate for any history of chronic calcific pancreatitis. Appearance is similar to that of the previous examination. Spleen: Normal in size without focal abnormality. Adrenals/Urinary Tract: The adrenal glands are preserved. No enhancing renal mass or collecting system dilatation. The ureters have normal course and caliber down to the bladder. Preserved contours of the urinary bladder. Nonspecific bilateral perinephric stranding. Stomach/Bowel: On this non oral contrast exam, large bowel is of normal course and caliber with mild-to-moderate colonic stool. Normal caliber appendix. The stomach and small bowel are nondilated. There are some distal small bowel stool appearance, nonspecific. Vascular/Lymphatic: Moderate vascular calcifications along the aorta and branch vessels. Normal caliber aorta and IVC. No specific abnormal lymph node enlargement identified in the abdomen and pelvis. Reproductive: Prostate is unremarkable. Other: No free air or free fluid. Musculoskeletal: Scattered degenerative changes of the spine and pelvis. IMPRESSION: Large bilateral pleural effusions.  Adjacent opacities. Atrophic pancreas with  multiple calcifications and dilated duct seen previously. Please correlate for any history of known chronic calcific pancreatitis. No bowel obstruction.  No free air or free fluid. No developing mass lesion or lymph node enlargement in the chest, abdomen and pelvis at this time. Electronically Signed   By: Karen Kays M.D.   On: 06/20/2023 11:25   CT CHEST WO CONTRAST  Result Date: 06/20/2023 CLINICAL DATA:  Pleural effusion. Malignancy suspected. History of CML. * Tracking Code: BO * EXAM: CT CHEST without contrast CT ABDOMEN AND PELVIS WITH CONTRAST TECHNIQUE: Multidetector CT imaging of the chest, abdomen and pelvis was performed. Chest without contrast. Followin abdomen and pelvis CT following g the standard protocol during bolus administration of intravenous contrast. RADIATION  DOSE REDUCTION: This exam was performed according to the departmental dose-optimization program which includes automated exposure control, adjustment of the mA and/or kV according to patient size and/or use of iterative reconstruction technique. CONTRAST:  OMNIPAQUE IOHEXOL 300 MG/ML  SOLN COMPARISON:  CT angiogram 01/21/2021. abdomen pelvis CT 09/13/2014 FINDINGS: CT CHEST FINDINGS Cardiovascular: Coronary artery calcifications are seen. Please correlate for other coronary risk factors. Trace pericardial fluid. The heart is nonenlarged. On this non IV contrast exam the thoracic aorta has a normal course and caliber. Mediastinum/Nodes: No specific abnormal lymph node enlargement identified in the axillary regions, hilum or mediastinum. There are a few small less than 1 cm size nodes identified, nonpathologic by size criteria. Normal caliber thoracic esophagus. Preserved thyroid gland. Lungs/Pleura: Large bilateral pleural effusions. Adjacent parenchymal opacities. No pneumothorax. Musculoskeletal: Mild degenerative changes along the spine. CT ABDOMEN PELVIS FINDINGS Hepatobiliary: No focal liver abnormality is seen. No gallstones, gallbladder wall thickening, or biliary dilatation. Pancreas: The pancreas has multiple punctate calcifications, parenchymal atrophy and dilated pancreatic duct. Please correlate for any history of chronic calcific pancreatitis. Appearance is similar to that of the previous examination. Spleen: Normal in size without focal abnormality. Adrenals/Urinary Tract: The adrenal glands are preserved. No enhancing renal mass or collecting system dilatation. The ureters have normal course and caliber down to the bladder. Preserved contours of the urinary bladder. Nonspecific bilateral perinephric stranding. Stomach/Bowel: On this non oral contrast exam, large bowel is of normal course and caliber with mild-to-moderate colonic stool. Normal caliber appendix. The stomach and small bowel are  nondilated. There are some distal small bowel stool appearance, nonspecific. Vascular/Lymphatic: Moderate vascular calcifications along the aorta and branch vessels. Normal caliber aorta and IVC. No specific abnormal lymph node enlargement identified in the abdomen and pelvis. Reproductive: Prostate is unremarkable. Other: No free air or free fluid. Musculoskeletal: Scattered degenerative changes of the spine and pelvis. IMPRESSION: Large bilateral pleural effusions.  Adjacent opacities. Atrophic pancreas with multiple calcifications and dilated duct seen previously. Please correlate for any history of known chronic calcific pancreatitis. No bowel obstruction.  No free air or free fluid. No developing mass lesion or lymph node enlargement in the chest, abdomen and pelvis at this time. Electronically Signed   By: Karen Kays M.D.   On: 06/20/2023 11:25   CT L-SPINE NO CHARGE  Result Date: 06/20/2023 CLINICAL DATA:  Low back pain, cancer suspected back pain, history of leukemia EXAM: CT LUMBAR SPINE WITHOUT CONTRAST TECHNIQUE: Multidetector CT imaging of the lumbar spine was performed without intravenous contrast administration. Multiplanar CT image reconstructions were also generated. RADIATION DOSE REDUCTION: This exam was performed according to the departmental dose-optimization program which includes automated exposure control, adjustment of the mA and/or kV according to patient size and/or use  of iterative reconstruction technique. COMPARISON:  Lumbar spine MRI 12/07/20 FINDINGS: Segmentation: 5 lumbar type vertebrae. Alignment: Normal. Vertebrae: No acute fracture or focal pathologic process. Paraspinal and other soft tissues: Bilateral pleural effusions. See separately dictated CT abdomen and pelvis for visceral findings. Disc levels: No evidence of high-grade spinal canal stenosis. IMPRESSION: 1. No acute fracture or traumatic listhesis. 2. No evidence of high-grade spinal canal stenosis. 3. See separately  dictated CT abdomen and pelvis for visceral findings. Electronically Signed   By: Lorenza Cambridge M.D.   On: 06/20/2023 11:10     No problem-specific Assessment & Plan notes found for this encounter.  62 year old male patient with multiple medical problems including chronic myeloid leukemia-on Dasatinib-admitted to hospital for worsening shortness of breath; noted to have pleural effusions.  # Bilateral pleural effusion-likely secondary to Dasatinib.  Currently status post right thoracentesis.  Pleural fluid suggestive of predominant lymphocytic etiology.  # Chronic myeloid leukemia-chronic.  On Dasatinib.   # Multiple other comorbidities including-chronic pancreatitis; diabetes type 2 on insulin pump; COPD  Recommendations/plan:  # Given clinical suspicion for Dasatinib causing pleural effusion, I recommend discontinuation of Dasatinib.  Most recently patient has been on Dasatinib 70 mg a day-dose reduced after multiple problems in the past.  Patient will need to be considered on alternative tyrosine kinase inhibitor.  # Agree with ruling out other causes of bilateral pleural effusion including infectious causes, cardiac etiology etc..  # The above plan of care was discussed with patient and his wife in detail.  They are in agreement.  Thank you Dr. Clide Dales for allowing me to participate in the care of your pleasant patient. Please do not hesitate to contact me with questions or concerns in the interim.     Earna Coder, MD 06/21/2023 10:58 PM

## 2023-06-22 DIAGNOSIS — N182 Chronic kidney disease, stage 2 (mild): Secondary | ICD-10-CM | POA: Diagnosis not present

## 2023-06-22 DIAGNOSIS — E1122 Type 2 diabetes mellitus with diabetic chronic kidney disease: Secondary | ICD-10-CM | POA: Diagnosis not present

## 2023-06-22 DIAGNOSIS — E782 Mixed hyperlipidemia: Secondary | ICD-10-CM | POA: Diagnosis not present

## 2023-06-22 DIAGNOSIS — J90 Pleural effusion, not elsewhere classified: Secondary | ICD-10-CM | POA: Diagnosis not present

## 2023-06-22 LAB — GLUCOSE, CAPILLARY
Glucose-Capillary: 198 mg/dL — ABNORMAL HIGH (ref 70–99)
Glucose-Capillary: 91 mg/dL (ref 70–99)
Glucose-Capillary: 103 mg/dL — ABNORMAL HIGH (ref 70–99)
Glucose-Capillary: 156 mg/dL — ABNORMAL HIGH (ref 70–99)
Glucose-Capillary: 230 mg/dL — ABNORMAL HIGH (ref 70–99)

## 2023-06-22 MED ORDER — INSULIN ASPART 100 UNIT/ML IJ SOLN
5.0000 [IU] | Freq: Three times a day (TID) | INTRAMUSCULAR | Status: DC
  Administered 2023-06-22 – 2023-06-23 (×4): 5 [IU] via SUBCUTANEOUS
  Filled 2023-06-22 (×4): qty 1

## 2023-06-22 MED ORDER — INSULIN GLARGINE-YFGN 100 UNIT/ML ~~LOC~~ SOLN
12.0000 [IU] | Freq: Every day | SUBCUTANEOUS | Status: DC
  Administered 2023-06-22: 12 [IU] via SUBCUTANEOUS
  Filled 2023-06-22 (×2): qty 0.12

## 2023-06-22 NOTE — TOC CM/SW Note (Signed)
No PCP listed. Spoke with patient by phone due to isolation. Patient says he goes to a Dr. Lala Lund for primary care. No additional TOC needs at this time.  Frank Ramus, LCSW Transitions of Care Department [phone]

## 2023-06-22 NOTE — Progress Notes (Signed)
Progress Note   Patient: Frank [name] XBJ:[id number] DOB: 11/14/1961 DOA: 06/20/2023     2 DOS: the patient was seen and examined on 06/22/2023   Brief hospital course: SRUJAN VALERA is a 62 y.o. 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  Patient was noted to have large bilateral pleural effusions, atrophic pancreas with punctate calcifications on the CT chest abdomen pelvis.  Patient will need inpatient admission and further management accordingly.   Assessment and Plan: Large bilateral Pleural effusions- BNP, echocardiogram normal limits Liver functions within normal limits. Continue to monitor saturations closely.  Oxygen as needed. Ultrasound-guided paracentesis with 1.4L fluid removed from right side. Lymphocyte predominance seen on pleural fluid analysis. Stop Dasatinib per oncology recommendations.   Chronic pancreatitis- Patient's symptoms of epigastric pain radiating to back likely due to his chronic pancreatitis. Dilaudid therapy for pain control. Advised to quit alcohol, smoking.   Type 2 Diabetes mellitus- Patient is on insulin pump which is removed.  Sensor not working. Blood sugars controlled. Semglee changed to 12 units at night, aspart 5 units with meals Continue Accu-Cheks, sliding scale insulin as per protocol. Carb consistent diet.   CML on oral chemotherapy- Patient is on Dasatinib therapy at home which is held due to pleural effusions Oncology team evaluation appreciated.   Hypertension-. BP stable, continue amlodipine 10mg , continue lisinopril. Hydralazine as needed for high blood pressures.   Hyperlipidemia- Continue home statin therapy   Active smoker- Smokes 2-3 cig/ day. Advised to quit smoking. Nicotine patch ordered.        Subjective: Patient is seen and examined today morning.  He is lying comfortably states breathing is better.  He feels much better. Eating poor.  Advised out of bed to chair, incentive spirometry.  Physical Exam: Vitals:   06/21/23 1537 06/21/23 2057 06/22/23 0438 06/22/23 0816  BP: (!) 149/86 136/86 136/87 132/88  Pulse: (!) 58 66 67 (!) 57  Resp: 18 20 18    Temp: 97.9 F (36.6 C) 97.8 F (36.6 C) 98.4 F (36.9 C) 98.2 F (36.8 C)  TempSrc: Oral Oral Oral Oral  SpO2: 99% 98% 97% 95%  Weight:      Height:       General - Elderly African American male, no acute distress HEENT - PERRLA, EOMI, atraumatic head, non tender sinuses. Lung - Clear, rales, rhonchi, wheezes. Heart - S1, S2 heard, no murmurs, rubs, no pedal edema Neuro - Alert, awake and oriented x 3, non focal exam. Skin - Warm and dry. Data Reviewed:     Latest Ref Rng & Units 06/21/2023    7:30 AM 06/20/2023    8:38 AM 04/28/2023    9:38 AM  CBC  WBC 4.0 - 10.5 K/uL 6.5  8.0  7.4   Hemoglobin 13.0 - 17.0 g/dL 30.8  65.7  84.6   Hematocrit 39.0 - 52.0 % 42.4  41.4  43.3   Platelets 150 - 400 K/uL 156  164  197        Latest Ref Rng & Units 06/21/2023    7:30 AM 06/20/2023    9:42 AM 04/28/2023    9:39 AM  BMP  Glucose 70 - 99 mg/dL 962  952  841   BUN 8 - 23 mg/dL 18  17  34   Creatinine 0.61 - 1.24 mg/dL 3.24  4.01  1.41   Sodium 135 - 145 mmol/L 132  135  131   Potassium 3.5 - 5.1 mmol/L 3.7  3.6  4.7   Chloride 98 - 111 mmol/L 101  106  103   CO2 22 - 32 mmol/L 20  21  22    Calcium 8.9 - 10.3 mg/dL 9.1  8.9  9.4      Family Communication: Patient understands and agrees with the above care plan  Disposition: Status is: Inpatient Remains inpatient appropriate because: Pleural effusion fluid analysis, oncology workup  Planned Discharge Destination: Home    Time spent: 42 minutes  Author: Marcelino Duster, MD 06/22/2023 2:12 PM  For on call review [email or web address].

## 2023-06-22 NOTE — Plan of Care (Signed)

## 2023-06-23 ENCOUNTER — Other Ambulatory Visit: Payer: Self-pay

## 2023-06-23 ENCOUNTER — Telehealth: Payer: Self-pay | Admitting: Internal Medicine

## 2023-06-23 DIAGNOSIS — Z7722 Contact with and (suspected) exposure to environmental tobacco smoke (acute) (chronic): Secondary | ICD-10-CM | POA: Diagnosis not present

## 2023-06-23 DIAGNOSIS — Z9889 Other specified postprocedural states: Secondary | ICD-10-CM

## 2023-06-23 DIAGNOSIS — J90 Pleural effusion, not elsewhere classified: Secondary | ICD-10-CM | POA: Diagnosis not present

## 2023-06-23 DIAGNOSIS — E1122 Type 2 diabetes mellitus with diabetic chronic kidney disease: Secondary | ICD-10-CM | POA: Diagnosis not present

## 2023-06-23 LAB — GLUCOSE, CAPILLARY
Glucose-Capillary: 341 mg/dL — ABNORMAL HIGH (ref 70–99)
Glucose-Capillary: 312 mg/dL — ABNORMAL HIGH (ref 70–99)
Glucose-Capillary: 52 mg/dL — ABNORMAL LOW (ref 70–99)
Glucose-Capillary: 94 mg/dL (ref 70–99)
Glucose-Capillary: 115 mg/dL — ABNORMAL HIGH (ref 70–99)

## 2023-06-23 NOTE — TOC Progression Note (Signed)
Transition of Care [facility]) - Progression Note    Patient Details  Name: Frank [name] MRN: [id number] Date of Birth: 1961-07-19  Transition of Care [facility]) CM/SW Contact  Truddie Hidden, RN Phone Number: 06/23/2023, 3:06 PM  Clinical Narrative:    TOC assessing for ongoing needs and discharge planning.        Expected Discharge Plan and Services                                               Social Determinants of Health (SDOH) Interventions SDOH Screenings   Food Insecurity: No Food Insecurity (06/20/2023)  Housing: Low Risk  (06/20/2023)  Transportation Needs: No Transportation Needs (06/20/2023)  Utilities: Not At Risk (06/20/2023)  Depression (PHQ2-9): Low Risk  (02/20/2021)  Tobacco Use: High Risk (06/20/2023)    Readmission Risk Interventions     No data to display

## 2023-06-23 NOTE — Progress Notes (Signed)
The patient has been discharged. IV has been d/c. The patient has been indicated to contact his endocrinologies for a follow up in one week. The patient is waiting for a ride. Education has been completed using the teach back method.

## 2023-06-23 NOTE — Care Management Important Message (Signed)
Important Message  Patient Details  Name: CJ GOREY MRN: [id number] Date of Birth: 1961/06/10   Medicare Important Message Given:  Yes  Due to contact precautions IM was left with bed side nurse to deliver.   Bernadette Hoit 06/23/2023, 3:31 PM

## 2023-06-23 NOTE — Inpatient Diabetes Management (Signed)
Inpatient Diabetes Program Recommendations  AACE/ADA: New Consensus Statement on Inpatient Glycemic Control (2015)  Target Ranges:  Prepandial:   less than 140 mg/dL      Peak postprandial:   less than 180 mg/dL (1-2 hours)      Critically ill patients:  140 - 180 mg/dL   Lab Results  Component Value Date   GLUCAP 341 (H) 06/23/2023   HGBA1C 9.2 (H) 06/20/2023    Review of Glycemic Control  Diabetes history: DM2 Outpatient Diabetes medications: Insulin pump (see below) Current orders for Inpatient glycemic control: Semglee 12 units every day, Novolog 5 units tid meal coverage, Novolog 0-15 units itd, 0-5 units hs  Inpatient Diabetes Program Recommendations:   Spoke with patient regarding insulin pump. Patient has Dexcom G6 sensors available @ home. Reviewed with patient last Semglee given last hs @ 2207 pm.  Tandem Control IQ and Dexcom G6 CGM  Insulin pump settings:  "Changes made during last visit 05/09/23:  Profile 1 - intended settings at last visit Start Time Basal Rates Target BG I:C Ratio Sensitivity (ISF) (Units/hr) (mg/dL) (g/Unit) (mg/dL/Unit) 96:29 AM 0.6 528 16 40 7:00 AM 0.34 130 15-->12 40 4:00 PM 0.34 130 18-->12 40 6:00 PM 0.34 130 14-->12 40 7:00 PM 0.53 130 15 40  Insulin summary Average daily dose 37.02u Basal 37% 13.65u Bolus 63% 23.36u Average daily boluses 18boluses Manual 44% 8boluses Control-IQ 56% 10boluses Average daily carbs 97g "  Thank you, Darel Hong E. Jonika Critz, RN, MSN, CDE  Diabetes Coordinator Inpatient Glycemic Control Team Team Pager [phone] (8am-5pm) 06/23/2023 10:15 AM

## 2023-06-23 NOTE — Plan of Care (Signed)
  Problem: Education: Goal: Knowledge of General Education information will improve Description: Including pain rating scale, medication(s)/side effects and non-pharmacologic comfort measures 06/23/2023 1806 by Murriel Hopper, Donald Pore, RN Outcome: Completed/Met 06/23/2023 1715 by Murriel Hopper, Donald Pore, RN Outcome: Progressing   Problem: Health Behavior/Discharge Planning: Goal: Ability to manage health-related needs will improve 06/23/2023 1806 by Murriel Hopper, Donald Pore, RN Outcome: Completed/Met 06/23/2023 1715 by Murriel Hopper, Donald Pore, RN Outcome: Progressing   Problem: Clinical Measurements: Goal: Ability to maintain clinical measurements within normal limits will improve 06/23/2023 1806 by Murriel Hopper, Donald Pore, RN Outcome: Completed/Met 06/23/2023 1715 by Murriel Hopper, Donald Pore, RN Outcome: Progressing Goal: Will remain free from infection 06/23/2023 1806 by Monica Becton, RN Outcome: Completed/Met 06/23/2023 1715 by Murriel Hopper, Donald Pore, RN Outcome: Progressing Goal: Diagnostic test results will improve 06/23/2023 1806 by Monica Becton, RN Outcome: Completed/Met 06/23/2023 1715 by Murriel Hopper, Donald Pore, RN Outcome: Progressing Goal: Respiratory complications will improve 06/23/2023 1806 by Monica Becton, RN Outcome: Completed/Met 06/23/2023 1715 by Murriel Hopper, Donald Pore, RN Outcome: Progressing Goal: Cardiovascular complication will be avoided 06/23/2023 1806 by Monica Becton, RN Outcome: Completed/Met 06/23/2023 1715 by Monica Becton, RN Outcome: Progressing   Problem: Activity: Goal: Risk for activity intolerance will decrease 06/23/2023 1806 by Murriel Hopper, Donald Pore, RN Outcome: Completed/Met 06/23/2023 1715 by Murriel Hopper, Donald Pore, RN Outcome: Progressing   Problem: Nutrition: Goal: Adequate nutrition will be maintained 06/23/2023 1806 by Murriel Hopper, Donald Pore, RN Outcome: Completed/Met 06/23/2023  1715 by Murriel Hopper, Donald Pore, RN Outcome: Progressing   Problem: Coping: Goal: Level of anxiety will decrease 06/23/2023 1806 by Murriel Hopper, Donald Pore, RN Outcome: Completed/Met 06/23/2023 1715 by Murriel Hopper, Donald Pore, RN Outcome: Progressing   Problem: Elimination: Goal: Will not experience complications related to bowel motility 06/23/2023 1806 by Monica Becton, RN Outcome: Completed/Met 06/23/2023 1715 by Murriel Hopper, Donald Pore, RN Outcome: Progressing Goal: Will not experience complications related to urinary retention 06/23/2023 1806 by Monica Becton, RN Outcome: Completed/Met 06/23/2023 1715 by Murriel Hopper, Donald Pore, RN Outcome: Progressing   Problem: Pain Managment: Goal: General experience of comfort will improve 06/23/2023 1806 by Monica Becton, RN Outcome: Completed/Met 06/23/2023 1715 by Murriel Hopper, Donald Pore, RN Outcome: Progressing   Problem: Safety: Goal: Ability to remain free from injury will improve 06/23/2023 1806 by Monica Becton, RN Outcome: Completed/Met 06/23/2023 1715 by Murriel Hopper, Donald Pore, RN Outcome: Progressing   Problem: Skin Integrity: Goal: Risk for impaired skin integrity will decrease 06/23/2023 1806 by Monica Becton, RN Outcome: Completed/Met 06/23/2023 1715 by Monica Becton, RN Outcome: Progressing

## 2023-06-23 NOTE — Discharge Summary (Signed)
Physician Discharge Summary   Patient: Frank [name] MRN: [id number] DOB: [birth year]  Admit date:     06/20/2023  Discharge date: 06/23/23  Discharge Physician: Marcelino Duster   PCP: System, Provider Not In   Recommendations at discharge:    PCP follow up in 1 week. Oncology follow up as scheduled.  Discharge Diagnoses: Principal Problem:   Pleural effusion Active Problems:   Chronic myeloid leukemia (HCC)   Chronic kidney disease   Hypertension   Hyperlipidemia   Type 2 diabetes mellitus with chronic kidney disease, with long-term current use of insulin (HCC)  Resolved Problems:   * No resolved hospital problems. *  Hospital Course: Frank [name] is a 62 y.o. 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  Patient was noted to have large bilateral pleural effusions, atrophic pancreas with punctate calcifications on the CT chest abdomen pelvis.  Patient is admitted to the hospitalist service for further management evaluation of bilateral pleural effusions.  Workup including effusions including BNP, echocardiogram, LFTs within normal limits.  Patient had ultrasound-guided paracentesis with 1.4 L removed from right pleural space.  Pleural fluid is mostly lymphocytic predominant.  He is evaluated by oncologist to advised to stop Dasatinib therapy as it can cause pleural effusions.  Blood sugars closely monitored, insulin adjusted accordingly.  Patient is on insulin pump which she is going to bring from home, follows endocrine allergy.  Patient pleural fluid cultures so far negative and is hemodynamically stable to be discharged home.  Advised him to follow-up with PCP, oncology upon discharge as instructed.  He understands and agrees with the discharge plan.  Assessment  and Plan: Large bilateral Pleural effusions- BNP, echocardiogram normal limits Liver functions within normal limits. Continue to monitor saturations closely.  Oxygen as needed. Ultrasound-guided paracentesis with 1.4L fluid removed from right side. Lymphocyte predominance seen on pleural fluid analysis. Stop Dasatinib per oncology recommendations.   Chronic pancreatitis- Patient's symptoms of epigastric pain radiating to back likely due to his chronic pancreatitis.  Continue pain control Advised to quit alcohol, smoking.   Type 2 Diabetes mellitus- Patient is on insulin pump which is removed as his sensor is not working. He was started on Semglee changed to 12 units at night, aspart 5 units with meals Patient will be instructed to follow-up with endocrinology, continue using insulin pump with new sensor Carb consistent diet advised.   CML on oral chemotherapy- Patient is on Dasatinib therapy at home which is held due to pleural effusions Oncology team follow-up as instructed   Hypertension-. BP stable, continue amlodipine 10mg , continue lisinopril.   Hyperlipidemia- Continue home statin therapy   Active smoker- Smokes 2-3 cig/ day. Advised to quit smoking. He has nicotine patch prescriptions.        Consultants: Oncology Procedures performed: right sided thoracentesis  Disposition: Home Diet recommendation:  Discharge Diet Orders (From admission, onward)     Start     Ordered   06/23/23 0000  Diet - low sodium heart healthy        06/23/23 1624   06/23/23 0000  Diet Carb Modified        06/23/23 1624           Cardiac and Carb modified diet DISCHARGE MEDICATION: Allergies as of 06/23/2023   No Known Allergies      Medication List  STOP taking these medications    nicotine polacrilex 4 MG lozenge Commonly known as: COMMIT   Sprycel 70 MG tablet Generic drug: dasatinib   timolol 0.5 % ophthalmic solution Commonly known as: TIMOPTIC        TAKE these medications    amLODipine 10 MG tablet Commonly known as: NORVASC Take 0.5 tablets by mouth daily.   aspirin 81 MG chewable tablet Chew 81 mg by mouth daily.   Azelastine HCl 137 MCG/SPRAY Soln Place 1 spray into the nose 2 (two) times daily as needed.   Brinzolamide-Brimonidine 1-0.2 % Susp Place 1 drop into both eyes in the morning, at noon, and at bedtime.   cetirizine 10 MG tablet Commonly known as: ZYRTEC Take 10 mg by mouth at bedtime.   dorzolamide-timolol 2-0.5 % ophthalmic solution Commonly known as: COSOPT Place 1 drop into both eyes 2 (two) times daily.   ergocalciferol 1.25 MG (50000 UT) capsule Commonly known as: VITAMIN D2 Take 1 capsule (50,000 Units total) by mouth once a week.   fluticasone 50 MCG/ACT nasal spray Commonly known as: FLONASE INSTILL 1 SPRAY INTO EACH NOSTRIL EVERY DAY AS NEEDED MAXIMUM 2 SPRAYS IN EACH NOSTRIL DAILY. FOR ALLERGIES   guaiFENesin 600 MG 12 hr tablet Commonly known as: MUCINEX TAKE ONE TABLET BY MOUTH TWO TIMES A DAY AS NEEDED FOR SINUS CONGESTION   insulin aspart 100 UNIT/ML injection Commonly known as: novoLOG Inject 100 Units into the skin continuous. INJECT 100 UNITS UNDER SKIN CONTINUOUS-VIA-PUMP AS DIRECTED FOR DIABETES.DISCARD BOTTLE 28 DAYS AFTER OPENING   insulin glargine 100 UNIT/ML injection Commonly known as: LANTUS 12 Units at bedtime.   latanoprost 0.005 % ophthalmic solution Commonly known as: XALATAN Place 1 drop into both eyes at bedtime.   lipase/protease/amylase 16109 UNITS Cpep capsule Commonly known as: CREON See admin instructions. TAKE 2 CAPSULES BY MOUTH WITH ACTIVE MEALS AND TAKE 1 CAPSULE WITH SNACK   lisinopril-hydrochlorothiazide 20-12.5 MG tablet Commonly known as: ZESTORETIC Take 1 tablet by mouth daily.   methocarbamol 500 MG tablet Commonly known as: ROBAXIN Take 1 tablet (500 mg total) by mouth every 8 (eight) hours as needed for muscle spasms.   nicotine 14  mg/24hr patch Commonly known as: NICODERM CQ - dosed in mg/24 hours Place 14 mg onto the skin daily. APPLY ONE PATCH TO SKIN EVERY DAY ACTIVE APPLY ONE PATCH WHEN YOU WAKE UP AND REMOVE THE OLD PATCH. PEEL THE BACK OFF THE PATCH AND PUT IT ON CLEAN,DRY, HAIR-FREE SKIN ON THE UPPER ARM, CHEST OR BACK   ondansetron 8 MG tablet Commonly known as: ZOFRAN TAKE ONE TABLET BY MOUTH 30-45 MINS PRIOR TO TAKING YOUR CHEMO TO PREVENT NAUSEA   pregabalin 50 MG capsule Commonly known as: Lyrica Take 1 capsule (50 mg total) by mouth 3 (three) times daily.   prochlorperazine 10 MG tablet Commonly known as: COMPAZINE Take 1 pill 30 to 45 minutes prior to taking your cancer pill.   rosuvastatin 10 MG tablet Commonly known as: CRESTOR Take 10 mg by mouth at bedtime. What changed: Another medication with the same name was removed. Continue taking this medication, and follow the directions you see here.   sodium chloride 0.65 % nasal spray Commonly known as: OCEAN SPRAY 2 SPRAYS INTO EACH NOSTRIL FOUR TIMES A DAY FOR SINUS CONGESTION        Discharge Exam: Filed Weights   06/20/23 0821  Weight: 77.7 kg  General - Elderly African American male, no acute distress HEENT - PERRLA,  EOMI, atraumatic head, non tender sinuses. Lung - Clear, rales, rhonchi, wheezes. Heart - S1, S2 heard, no murmurs, rubs, no pedal edema Neuro - Alert, awake and oriented x 3, non focal exam. Skin - Warm and dry.  Condition at discharge: stable  The results of significant diagnostics from this hospitalization (including imaging, microbiology, ancillary and laboratory) are listed below for reference.   Imaging Studies: US THORACENTESIS ASP PLEURAL SPACE W/IMG GUIDE  Result Date: 06/23/2023 INDICATION: Patient with history of CML, chronic pancreatitis, active smoker presents with shortness of breath. Found to have bilateral pleural effusions. Request made for thoracentesis. EXAM: ULTRASOUND GUIDED RIGHT  THORACENTESIS MEDICATIONS: 10 mL 1% lidocaine COMPLICATIONS: None immediate. PROCEDURE: An ultrasound guided thoracentesis was thoroughly discussed with the patient and questions answered. The benefits, risks, alternatives and complications were also discussed. The patient understands and wishes to proceed with the procedure. Written consent was obtained. Ultrasound was performed to localize and mark an adequate pocket of fluid in the right chest. The area was then prepped and draped in the normal sterile fashion. 1% Lidocaine was used for local anesthesia. Under ultrasound guidance a 6 Fr Safe-T-Centesis catheter was introduced. Thoracentesis was performed. The catheter was removed and a dressing applied. FINDINGS: A total of approximately 1.4 liters of dark yellow fluid was removed. Samples were sent to the laboratory as requested by the clinical team. IMPRESSION: Successful ultrasound guided right thoracentesis yielding 1.4 liters of pleural fluid. Performed by: Loyce Dys PA-C Electronically Signed   By: Olive Bass M.D.   On: 06/23/2023 08:02   ECHOCARDIOGRAM COMPLETE  Result Date: 06/20/2023    ECHOCARDIOGRAM REPORT   Patient Name:   Frank [name] Date of Exam: 06/20/2023 Medical Rec #:  [id number]      Height:       68.0 in Accession #:    [id number]     Weight:       171.3 lb Date of Birth:  06-21-1961      BSA:          1.914 m Patient Age:    61 years       BP:           160/98 mmHg Patient Gender: M              HR:           62 bpm. Exam Location:  [facility] Procedure: 2D Echo, Cardiac Doppler and Color Doppler Indications:     Dyspnea  History:         Patient has no prior history of Echocardiogram examinations.                  Signs/Symptoms:Dyspnea; Risk Factors:Hypertension, Diabetes and                  Dyslipidemia. CKD, Pleural effusion.  Sonographer:     Mikki Harbor Referring Phys:  [id number] Marcelino Duster Diagnosing Phys: Julien Nordmann MD  Sonographer Comments: Technically  difficult study due to poor echo windows. Image acquisition challenging due to respiratory motion. IMPRESSIONS  1. Left ventricular ejection fraction, by estimation, is 60 to 65%. The left ventricle has normal function. The left ventricle has no regional wall motion abnormalities. Left ventricular diastolic parameters are consistent with Grade I diastolic dysfunction (impaired relaxation).  2. Right ventricular systolic function is normal. The right ventricular size is normal. There is normal pulmonary artery systolic pressure.  3. The mitral valve is normal in structure. No evidence of mitral  valve regurgitation. No evidence of mitral stenosis.  4. The aortic valve is normal in structure. Aortic valve regurgitation is not visualized. Aortic valve sclerosis is present, with no evidence of aortic valve stenosis.  5. The inferior vena cava is normal in size with greater than 50% respiratory variability, suggesting right atrial pressure of 3 mmHg. FINDINGS  Left Ventricle: Left ventricular ejection fraction, by estimation, is 60 to 65%. The left ventricle has normal function. The left ventricle has no regional wall motion abnormalities. The left ventricular internal cavity size was normal in size. There is  no left ventricular hypertrophy. Left ventricular diastolic parameters are consistent with Grade I diastolic dysfunction (impaired relaxation). Right Ventricle: The right ventricular size is normal. No increase in right ventricular wall thickness. Right ventricular systolic function is normal. There is normal pulmonary artery systolic pressure. The tricuspid regurgitant velocity is 2.20 m/s, and  with an assumed right atrial pressure of 3 mmHg, the estimated right ventricular systolic pressure is 22.4 mmHg. Left Atrium: Left atrial size was normal in size. Right Atrium: Right atrial size was normal in size. Pericardium: There is no evidence of pericardial effusion. Mitral Valve: The mitral valve is normal in  structure. There is mild calcification of the mitral valve leaflet(s). No evidence of mitral valve regurgitation. No evidence of mitral valve stenosis. MV peak gradient, 3.6 mmHg. The mean mitral valve gradient  is 1.0 mmHg. Tricuspid Valve: The tricuspid valve is normal in structure. Tricuspid valve regurgitation is not demonstrated. No evidence of tricuspid stenosis. Aortic Valve: The aortic valve is normal in structure. Aortic valve regurgitation is not visualized. Aortic valve sclerosis is present, with no evidence of aortic valve stenosis. Aortic valve mean gradient measures 3.0 mmHg. Aortic valve peak gradient measures 5.5 mmHg. Aortic valve area, by VTI measures 2.05 cm. Pulmonic Valve: The pulmonic valve was normal in structure. Pulmonic valve regurgitation is not visualized. No evidence of pulmonic stenosis. Aorta: The aortic root is normal in size and structure. Venous: The inferior vena cava is normal in size with greater than 50% respiratory variability, suggesting right atrial pressure of 3 mmHg. IAS/Shunts: No atrial level shunt detected by color flow Doppler.  LEFT VENTRICLE PLAX 2D LVIDd:         4.00 cm   Diastology LVIDs:         2.20 cm   LV e' medial:    7.94 cm/s LV PW:         0.80 cm   LV E/e' medial:  9.4 LV IVS:        0.90 cm   LV e' lateral:   7.51 cm/s LVOT diam:     1.90 cm   LV E/e' lateral: 10.0 LV SV:         67 LV SV Index:   35 LVOT Area:     2.84 cm  RIGHT VENTRICLE RV Basal diam:  3.00 cm RV Mid diam:    2.80 cm RV S prime:     11.50 cm/s TAPSE (M-mode): 2.4 cm LEFT ATRIUM           Index        RIGHT ATRIUM           Index LA diam:      2.90 cm 1.52 cm/m   RA Area:     13.80 cm LA Vol (A2C): 34.6 ml 18.08 ml/m  RA Volume:   37.40 ml  19.55 ml/m LA Vol (A4C): 50.2 ml 26.23 ml/m  AORTIC VALVE                    PULMONIC VALVE AV Area (Vmax):    2.45 cm     PV Vmax:       1.15 m/s AV Area (Vmean):   2.34 cm     PV Peak grad:  5.3 mmHg AV Area (VTI):     2.05 cm AV Vmax:            117.00 cm/s AV Vmean:          82.100 cm/s AV VTI:            0.329 m AV Peak Grad:      5.5 mmHg AV Mean Grad:      3.0 mmHg LVOT Vmax:         101.00 cm/s LVOT Vmean:        67.900 cm/s LVOT VTI:          0.238 m LVOT/AV VTI ratio: 0.72  AORTA Ao Root diam: 3.60 cm MITRAL VALVE               TRICUSPID VALVE MV Area (PHT): 3.33 cm    TR Peak grad:   19.4 mmHg MV Area VTI:   2.58 cm    TR Vmax:        220.00 cm/s MV Peak grad:  3.6 mmHg MV Mean grad:  1.0 mmHg    SHUNTS MV Vmax:       0.95 m/s    Systemic VTI:  0.24 m MV Vmean:      55.3 cm/s   Systemic Diam: 1.90 cm MV Decel Time: 228 msec MV E velocity: 74.90 cm/s MV A velocity: 79.70 cm/s MV E/A ratio:  0.94 Julien Nordmann MD Electronically signed by Julien Nordmann MD Signature Date/Time: 06/20/2023/5:36:16 PM    Final    DG Chest Port 1 View  Result Date: 06/20/2023 CLINICAL DATA:  Pleural effusion, status post thoracentesis EXAM: PORTABLE CHEST 1 VIEW COMPARISON:  CT done on 06/20/2023 FINDINGS: There is significant interval decrease in right pleural effusion. There is small residual right pleural effusion. There is moderate left pleural effusion. There is no demonstrable apical pneumothorax. Cardiac size is within normal limits. There are no signs of pulmonary edema. There are patchy infiltrates in left mid and both lower lung fields. IMPRESSION: There is significant interval decrease in right pleural effusion. There is moderate left pleural effusion. Infiltrates are seen in left mid and both lower lung fields suggesting atelectasis/pneumonia. There is no demonstrable pneumothorax. Electronically Signed   By: Ernie Avena M.D.   On: 06/20/2023 16:54   CT ABDOMEN PELVIS W CONTRAST  Result Date: 06/20/2023 CLINICAL DATA:  Pleural effusion. Malignancy suspected. History of CML. * Tracking Code: BO * EXAM: CT CHEST without contrast CT ABDOMEN AND PELVIS WITH CONTRAST TECHNIQUE: Multidetector CT imaging of the chest, abdomen and pelvis was  performed. Chest without contrast. Followin abdomen and pelvis CT following g the standard protocol during bolus administration of intravenous contrast. RADIATION DOSE REDUCTION: This exam was performed according to the departmental dose-optimization program which includes automated exposure control, adjustment of the mA and/or kV according to patient size and/or use of iterative reconstruction technique. CONTRAST:  OMNIPAQUE IOHEXOL 300 MG/ML  SOLN COMPARISON:  CT angiogram 01/21/2021. abdomen pelvis CT 09/13/2014 FINDINGS: CT CHEST FINDINGS Cardiovascular: Coronary artery calcifications are seen. Please correlate for other coronary risk factors. Trace pericardial fluid. The heart is nonenlarged. On  this non IV contrast exam the thoracic aorta has a normal course and caliber. Mediastinum/Nodes: No specific abnormal lymph node enlargement identified in the axillary regions, hilum or mediastinum. There are a few small less than 1 cm size nodes identified, nonpathologic by size criteria. Normal caliber thoracic esophagus. Preserved thyroid gland. Lungs/Pleura: Large bilateral pleural effusions. Adjacent parenchymal opacities. No pneumothorax. Musculoskeletal: Mild degenerative changes along the spine. CT ABDOMEN PELVIS FINDINGS Hepatobiliary: No focal liver abnormality is seen. No gallstones, gallbladder wall thickening, or biliary dilatation. Pancreas: The pancreas has multiple punctate calcifications, parenchymal atrophy and dilated pancreatic duct. Please correlate for any history of chronic calcific pancreatitis. Appearance is similar to that of the previous examination. Spleen: Normal in size without focal abnormality. Adrenals/Urinary Tract: The adrenal glands are preserved. No enhancing renal mass or collecting system dilatation. The ureters have normal course and caliber down to the bladder. Preserved contours of the urinary bladder. Nonspecific bilateral perinephric stranding. Stomach/Bowel: On this non  oral contrast exam, large bowel is of normal course and caliber with mild-to-moderate colonic stool. Normal caliber appendix. The stomach and small bowel are nondilated. There are some distal small bowel stool appearance, nonspecific. Vascular/Lymphatic: Moderate vascular calcifications along the aorta and branch vessels. Normal caliber aorta and IVC. No specific abnormal lymph node enlargement identified in the abdomen and pelvis. Reproductive: Prostate is unremarkable. Other: No free air or free fluid. Musculoskeletal: Scattered degenerative changes of the spine and pelvis. IMPRESSION: Large bilateral pleural effusions.  Adjacent opacities. Atrophic pancreas with multiple calcifications and dilated duct seen previously. Please correlate for any history of known chronic calcific pancreatitis. No bowel obstruction.  No free air or free fluid. No developing mass lesion or lymph node enlargement in the chest, abdomen and pelvis at this time. Electronically Signed   By: Karen Kays M.D.   On: 06/20/2023 11:25   CT CHEST WO CONTRAST  Result Date: 06/20/2023 CLINICAL DATA:  Pleural effusion. Malignancy suspected. History of CML. * Tracking Code: BO * EXAM: CT CHEST without contrast CT ABDOMEN AND PELVIS WITH CONTRAST TECHNIQUE: Multidetector CT imaging of the chest, abdomen and pelvis was performed. Chest without contrast. Followin abdomen and pelvis CT following g the standard protocol during bolus administration of intravenous contrast. RADIATION DOSE REDUCTION: This exam was performed according to the departmental dose-optimization program which includes automated exposure control, adjustment of the mA and/or kV according to patient size and/or use of iterative reconstruction technique. CONTRAST:  OMNIPAQUE IOHEXOL 300 MG/ML  SOLN COMPARISON:  CT angiogram 01/21/2021. abdomen pelvis CT 09/13/2014 FINDINGS: CT CHEST FINDINGS Cardiovascular: Coronary artery calcifications are seen. Please correlate for other  coronary risk factors. Trace pericardial fluid. The heart is nonenlarged. On this non IV contrast exam the thoracic aorta has a normal course and caliber. Mediastinum/Nodes: No specific abnormal lymph node enlargement identified in the axillary regions, hilum or mediastinum. There are a few small less than 1 cm size nodes identified, nonpathologic by size criteria. Normal caliber thoracic esophagus. Preserved thyroid gland. Lungs/Pleura: Large bilateral pleural effusions. Adjacent parenchymal opacities. No pneumothorax. Musculoskeletal: Mild degenerative changes along the spine. CT ABDOMEN PELVIS FINDINGS Hepatobiliary: No focal liver abnormality is seen. No gallstones, gallbladder wall thickening, or biliary dilatation. Pancreas: The pancreas has multiple punctate calcifications, parenchymal atrophy and dilated pancreatic duct. Please correlate for any history of chronic calcific pancreatitis. Appearance is similar to that of the previous examination. Spleen: Normal in size without focal abnormality. Adrenals/Urinary Tract: The adrenal glands are preserved. No enhancing renal mass or  collecting system dilatation. The ureters have normal course and caliber down to the bladder. Preserved contours of the urinary bladder. Nonspecific bilateral perinephric stranding. Stomach/Bowel: On this non oral contrast exam, large bowel is of normal course and caliber with mild-to-moderate colonic stool. Normal caliber appendix. The stomach and small bowel are nondilated. There are some distal small bowel stool appearance, nonspecific. Vascular/Lymphatic: Moderate vascular calcifications along the aorta and branch vessels. Normal caliber aorta and IVC. No specific abnormal lymph node enlargement identified in the abdomen and pelvis. Reproductive: Prostate is unremarkable. Other: No free air or free fluid. Musculoskeletal: Scattered degenerative changes of the spine and pelvis. IMPRESSION: Large bilateral pleural effusions.   Adjacent opacities. Atrophic pancreas with multiple calcifications and dilated duct seen previously. Please correlate for any history of known chronic calcific pancreatitis. No bowel obstruction.  No free air or free fluid. No developing mass lesion or lymph node enlargement in the chest, abdomen and pelvis at this time. Electronically Signed   By: Karen Kays M.D.   On: 06/20/2023 11:25   CT L-SPINE NO CHARGE  Result Date: 06/20/2023 CLINICAL DATA:  Low back pain, cancer suspected back pain, history of leukemia EXAM: CT LUMBAR SPINE WITHOUT CONTRAST TECHNIQUE: Multidetector CT imaging of the lumbar spine was performed without intravenous contrast administration. Multiplanar CT image reconstructions were also generated. RADIATION DOSE REDUCTION: This exam was performed according to the departmental dose-optimization program which includes automated exposure control, adjustment of the mA and/or kV according to patient size and/or use of iterative reconstruction technique. COMPARISON:  Lumbar spine MRI 12/07/20 FINDINGS: Segmentation: 5 lumbar type vertebrae. Alignment: Normal. Vertebrae: No acute fracture or focal pathologic process. Paraspinal and other soft tissues: Bilateral pleural effusions. See separately dictated CT abdomen and pelvis for visceral findings. Disc levels: No evidence of high-grade spinal canal stenosis. IMPRESSION: 1. No acute fracture or traumatic listhesis. 2. No evidence of high-grade spinal canal stenosis. 3. See separately dictated CT abdomen and pelvis for visceral findings. Electronically Signed   By: Lorenza Cambridge M.D.   On: 06/20/2023 11:10    Microbiology: Results for orders placed or performed during the hospital encounter of 06/20/23  Body fluid culture w Gram Stain     Status: None (Preliminary result)   Collection Time: 06/20/23  4:34 PM   Specimen: PATH Cytology Pleural fluid  Result Value Ref Range Status   Specimen Description   Final    PLEURAL Performed at  [facility], [address], [place], [place] [address]    Special Requests   Final    OTHER CELLS UNIDENTIFIED; SEE CYTOLOGY REPORT Performed at [facility], [address] [place], [place] [address]    Gram Stain NO WBC SEEN NO ORGANISMS SEEN   Final   Culture   Final    NO GROWTH 2 DAYS Performed at [facility], [address], [place], [place] [address]    Report Status PENDING  Incomplete    Labs: CBC: Recent Labs  Lab 06/20/23 0838 06/21/23 0730  WBC 8.0 6.5  NEUTROABS 5.9  --   HGB 14.0 14.5  HCT 41.4 42.4  MCV 90.8 89.8  PLT 164 156   Basic Metabolic Panel: Recent Labs  Lab 06/20/23 0942 06/21/23 0730  NA 135 132*  K 3.6 3.7  CL 106 101  CO2 21* 20*  GLUCOSE 235* 198*  BUN 17 18  CREATININE 0.94 0.93  CALCIUM 8.9 9.1   Liver Function Tests: Recent Labs  Lab 06/20/23  0942  AST 21  ALT 21  ALKPHOS 62  BILITOT 0.4  PROT 7.6  ALBUMIN 3.9   CBG: Recent Labs  Lab 06/22/23 2134 06/23/23 0807 06/23/23 1125 06/23/23 1518 06/23/23 1553  GLUCAP 230* 341* 312* 52* 94    Discharge time spent: greater than 30 minutes.  Signed: Marcelino Duster, MD [facility] 06/23/2023

## 2023-06-23 NOTE — Plan of Care (Signed)
Blood glucose has improved from previous blood glucose of 52. The patient is independent in the room ambulating.  Problem: Education: Goal: Knowledge of General Education information will improve Description: Including pain rating scale, medication(s)/side effects and non-pharmacologic comfort measures Outcome: Progressing   Problem: Health Behavior/Discharge Planning: Goal: Ability to manage health-related needs will improve Outcome: Progressing   Problem: Clinical Measurements: Goal: Ability to maintain clinical measurements within normal limits will improve Outcome: Progressing Goal: Will remain free from infection Outcome: Progressing Goal: Diagnostic test results will improve Outcome: Progressing Goal: Respiratory complications will improve Outcome: Progressing Goal: Cardiovascular complication will be avoided Outcome: Progressing   Problem: Activity: Goal: Risk for activity intolerance will decrease Outcome: Progressing   Problem: Nutrition: Goal: Adequate nutrition will be maintained Outcome: Progressing   Problem: Coping: Goal: Level of anxiety will decrease Outcome: Progressing   Problem: Elimination: Goal: Will not experience complications related to bowel motility Outcome: Progressing Goal: Will not experience complications related to urinary retention Outcome: Progressing   Problem: Pain Managment: Goal: General experience of comfort will improve Outcome: Progressing   Problem: Safety: Goal: Ability to remain free from injury will improve Outcome: Progressing   Problem: Skin Integrity: Goal: Risk for impaired skin integrity will decrease Outcome: Progressing

## 2023-06-23 NOTE — Telephone Encounter (Signed)
BC- please have this pt follow up with me next week- MD: labs- cbc/cmp; Thanks- keep other appt scheudled as planned GB

## 2023-06-24 ENCOUNTER — Other Ambulatory Visit (HOSPITAL_COMMUNITY): Payer: Self-pay

## 2023-06-24 ENCOUNTER — Other Ambulatory Visit: Payer: Self-pay

## 2023-06-24 DIAGNOSIS — C9210 Chronic myeloid leukemia, BCR/ABL-positive, not having achieved remission: Secondary | ICD-10-CM

## 2023-06-24 NOTE — Telephone Encounter (Signed)
Labs entered for next week.

## 2023-06-25 ENCOUNTER — Other Ambulatory Visit: Payer: Self-pay | Admitting: Internal Medicine

## 2023-06-26 LAB — CHOLESTEROL, BODY FLUID: Cholesterol, Fluid: 34 mg/dL

## 2023-06-27 ENCOUNTER — Other Ambulatory Visit (HOSPITAL_COMMUNITY): Payer: Self-pay

## 2023-07-03 ENCOUNTER — Other Ambulatory Visit (HOSPITAL_BASED_OUTPATIENT_CLINIC_OR_DEPARTMENT_OTHER): Payer: Self-pay

## 2023-07-03 ENCOUNTER — Other Ambulatory Visit (HOSPITAL_COMMUNITY): Payer: Self-pay

## 2023-07-03 NOTE — Progress Notes (Signed)
CKD Stage 2

## 2023-07-04 ENCOUNTER — Other Ambulatory Visit (HOSPITAL_COMMUNITY): Payer: Self-pay

## 2023-07-04 ENCOUNTER — Encounter: Payer: Self-pay | Admitting: Internal Medicine

## 2023-07-04 ENCOUNTER — Inpatient Hospital Stay: Payer: 59 | Attending: Internal Medicine | Admitting: Internal Medicine

## 2023-07-04 ENCOUNTER — Other Ambulatory Visit: Payer: Self-pay

## 2023-07-04 VITALS — BP 139/82 | HR 72 | Temp 97.9°F | Ht 68.0 in | Wt 168.6 lb

## 2023-07-04 DIAGNOSIS — N183 Chronic kidney disease, stage 3 unspecified: Secondary | ICD-10-CM | POA: Diagnosis not present

## 2023-07-04 DIAGNOSIS — E1122 Type 2 diabetes mellitus with diabetic chronic kidney disease: Secondary | ICD-10-CM | POA: Diagnosis not present

## 2023-07-04 DIAGNOSIS — Z794 Long term (current) use of insulin: Secondary | ICD-10-CM | POA: Diagnosis not present

## 2023-07-04 DIAGNOSIS — J90 Pleural effusion, not elsewhere classified: Secondary | ICD-10-CM | POA: Diagnosis not present

## 2023-07-04 DIAGNOSIS — C9210 Chronic myeloid leukemia, BCR/ABL-positive, not having achieved remission: Secondary | ICD-10-CM | POA: Diagnosis present

## 2023-07-04 DIAGNOSIS — D631 Anemia in chronic kidney disease: Secondary | ICD-10-CM | POA: Insufficient documentation

## 2023-07-04 DIAGNOSIS — M25521 Pain in right elbow: Secondary | ICD-10-CM | POA: Diagnosis not present

## 2023-07-04 NOTE — Assessment & Plan Note (Addendum)
#  Chronic myeloid leukemia- on dasatinib 70 mg/day [1st week of April, 2022].  Tolerating well-except for nausea see below. JUNE 2024 NEGATIVE for bcr-abl. FISH bcr-abl- negative.   # Discontinue dasatinib because of bilateral pleural effusions.  Discussed regarding alternative TKI.  Less likely neratinib given diabetes/PVD.  Consider Bosutinib vs other options.   #  bilateral pleural effusion-s/p  postthoracentesis.  [Lymphocytic]-  Suspect to be from Dasatinib.  Dasatinib discontinued.  Check chest x-ray.  # Intermittent Nausea- sec to Dasatinib vs others [gastroparesis]-  zofran not helping; continue compazine. Recommend evaluation with GI -[facility] evaluation.   # right tennis elbow- ince; topical NSAIDs prn- check elbow x-ray.   #Mild anemia/CKD--hemoglobin 12.5 - saturation 10%; continued dietary [liver]- Stable.   #Reflux-ron PPIs- stable.   #Diabetes-poorly controlled; currently on insulin pump- BG- 277  [VA]. stable.   # CKD- stage III- GFR 57- stable.   Monday- pref/wife off work  # DISPOSITION:  # CXR re: effusions # labs- 08/12 # follow up in sep 9th-MD;  cbc/cmp/magnesium-  Dr.B  # 40 minutes face-to-face with the patient discussing the above plan of care; more than 50% of time spent on prognosis/ natural history; counseling and coordination.

## 2023-07-04 NOTE — Progress Notes (Signed)
[name] CONSULT NOTE  Patient Care Team: System, Provider Not In as PCP - General Frank Coder, MD as Consulting Physician (Oncology)  CHIEF COMPLAINTS/PURPOSE OF CONSULTATION: CML  #  Oncology History Overview Note  Result Comment:   Comment: (NOTE)  ABNORMAL: 89% OF NUCLEI POSITIVE FOR BCR/ABL1 GENE FUSION  SIGNAL(S)     # MARCH 2022--  Hypercellular bone marrow with involvement by chronic myeloid  leukemia, BCR-ABL1-positive . he bone marrow is hypercellular for age with myeloid and megakaryocytic  hyperplasia.  There is no increase in blasts by manual differential count of aspirate smears (<1%) or by CD34 immunohistochemistry on the core biopsy.  A reticulin special stain reveals no significant increase  in reticulin fibrosis (MF grade 0 of 3).  # April 1st week -DASATINIB 100 mg; July 2022-FISH BCR ABL-negative for 9:22 abnormality [optimal; reached 97-month milestone. MMR at 10 months [gaol at 12];   # FEB 2024-secondary to chronic nausea-decrease dose of dasatinib to 70 mg/day.    #hypertension /alcoholic liver/ type 2 diabetes on insulin poorly controlled chronic back pain    Chronic myeloid leukemia (HCC)  01/31/2021 Initial Diagnosis   Chronic myeloid leukemia (HCC)    HISTORY OF PRESENTING ILLNESS: Ambulating independently.  Accompanied by his wife.  Frank [name] 62 y.o.  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  Patient was recently admitted to hospital for worsening shortness of breath.  Noted to have bilateral pleural effusion.  Chest postthoracentesis.  Suggestive of lymphocytic.  Suspected to be from Dasatinib.  Dasatinib discontinued.   His right elbow is hurting, he went fishing and now it hurts like crazy.   Has nausea, no vomiting. Denies any worsening diarrhea or abdominal pain. No fever no chills.    Review of Systems  Constitutional:   Positive for malaise/fatigue. Negative for chills, diaphoresis, fever and weight loss.  HENT:  Negative for nosebleeds and sore throat.   Eyes:  Negative for double vision.  Respiratory:  Negative for cough, hemoptysis, sputum production, shortness of breath and wheezing.   Cardiovascular:  Negative for chest pain, palpitations, orthopnea and leg swelling.  Gastrointestinal:  Negative for abdominal pain, blood in stool, constipation, diarrhea, heartburn, melena, nausea and vomiting.  Genitourinary:  Negative for dysuria, frequency and urgency.  Musculoskeletal:  Positive for back pain and joint pain.  Skin: Negative.  Negative for itching and rash.  Neurological:  Negative for dizziness, tingling, focal weakness, weakness and headaches.  Endo/Heme/Allergies:  Does not bruise/bleed easily.  Psychiatric/Behavioral:  Negative for depression. The patient is not nervous/anxious and does not have insomnia.      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 No pertinent surgical history.  SOCIAL HISTORY: Social History   Socioeconomic History   Marital status: Single    Spouse name: Not on file   Number of children: Not on file   Years of education: Not on file   Highest education level: Not on file  Occupational History   Occupation: retired     Comment: custodian   Tobacco Use   Smoking status: Some Days    Types: Cigarettes   Smokeless tobacco: Never   Tobacco comments:    4 cigarettes yesterday   Vaping Use   Vaping status: Never Used  Substance and Sexual Activity   Alcohol use: Yes    Comment: hx of  ETOH abuse   Drug use: Yes    Types: Cocaine, Marijuana    Comment: had cocaine Sat. (2 days ago)   Sexual activity: Not on file  Other Topics Concern   Not on file  Social History Narrative   Lives with S.O. Frank [name]    Social Determinants of Health   Financial Resource Strain: Not on file  Food Insecurity: No Food Insecurity (06/20/2023)   Hunger Vital Sign    Worried About Running Out of Food in the Last Year: Never true    Ran Out of Food in the Last Year: Never true  Transportation Needs: No Transportation Needs (06/20/2023)   PRAPARE - Administrator, Civil Service (Medical): No    Lack of Transportation (Non-Medical): No  Physical Activity: Not on file  Stress: Not on file  Social Connections: Not on file  Intimate Partner Violence: Not At Risk (06/20/2023)   Humiliation, Afraid, Rape, and Kick questionnaire    Fear of Current or Ex-Partner: No    Emotionally Abused: No    Physically Abused: No    Sexually Abused: No    FAMILY HISTORY: Family History  Problem Relation Age of Onset   Diabetes Mother    Hypertension Mother    Heart failure Mother    Hyperlipidemia Mother    Thyroid disease Mother    Alcohol abuse Father    Emphysema Father    Kidney failure Father    Gout Father    Cancer Sister    Hyperlipidemia Sister    Hypertension Sister    Rheum arthritis Sister    Fibromyalgia Sister    Thyroid disease Sister     ALLERGIES:  has No Known Allergies.  MEDICATIONS:  Current Outpatient Medications  Medication Sig Dispense Refill   amLODipine (NORVASC) 10 MG tablet Take 0.5 tablets by mouth daily.     aspirin 81 MG chewable tablet Chew 81 mg by mouth daily.     Azelastine HCl 137 MCG/SPRAY SOLN Place 1 spray into the nose 2 (two) times daily as needed.     Brinzolamide-Brimonidine 1-0.2 % SUSP Place 1 drop into both eyes in the morning, at noon, and at bedtime.     cetirizine (ZYRTEC) 10 MG tablet Take 10 mg by mouth at bedtime.      dorzolamide-timolol (COSOPT) 2-0.5 % ophthalmic solution Place 1 drop into both eyes 2 (two) times daily.     ergocalciferol (VITAMIN D2) 1.25 MG (50000 UT) capsule Take 1 capsule (50,000 Units total) by mouth once a week. 12 capsule 3   fluticasone (FLONASE) 50 MCG/ACT nasal spray INSTILL 1 SPRAY INTO EACH NOSTRIL EVERY DAY AS NEEDED MAXIMUM 2 SPRAYS IN EACH NOSTRIL DAILY. FOR ALLERGIES     insulin aspart (NOVOLOG) 100 UNIT/ML injection Inject 100 Units into the skin continuous. INJECT 100 UNITS UNDER SKIN CONTINUOUS-VIA-PUMP AS DIRECTED FOR DIABETES.DISCARD BOTTLE 28 DAYS AFTER OPENING     insulin glargine (LANTUS) 100 UNIT/ML injection 12 Units at bedtime.     latanoprost (XALATAN) 0.005 % ophthalmic solution Place 1 drop into both eyes at  bedtime.     lipase/protease/amylase (CREON) 36000 UNITS CPEP capsule See admin instructions. TAKE 2 CAPSULES BY MOUTH WITH ACTIVE MEALS AND TAKE 1 CAPSULE WITH SNACK     lisinopril-hydrochlorothiazide (ZESTORETIC) 20-12.5 MG tablet Take 1 tablet by mouth daily.     methocarbamol (ROBAXIN) 500 MG tablet Take 1 tablet (500 mg total) by mouth every 8 (eight) hours as needed for muscle spasms. 90 tablet 1   ondansetron (ZOFRAN) 8 MG tablet TAKE ONE TABLET BY MOUTH 30-45 MINS PRIOR TO TAKING YOUR CHEMO TO PREVENT NAUSEA 30 tablet 3   pregabalin (LYRICA) 50 MG capsule Take 1 capsule (50 mg total) by mouth 3 (three) times daily. 90 capsule 1   prochlorperazine (COMPAZINE) 10 MG tablet TAKE 1 TABLET BY MOUTH 30-45 MINUTES PRIOR TO TAKING CANCER MEDICATION 60 tablet 3   rosuvastatin (CRESTOR) 10 MG tablet Take 10 mg by mouth at bedtime.     sodium chloride (OCEAN) 0.65 % nasal spray SPRAY 2 SPRAYS INTO EACH NOSTRIL FOUR TIMES A DAY FOR SINUS CONGESTION     nicotine (NICODERM CQ - DOSED IN MG/24 HOURS) 14 mg/24hr patch Place 14 mg onto the skin daily. APPLY ONE PATCH TO SKIN EVERY DAY ACTIVE APPLY ONE PATCH WHEN YOU WAKE UP AND REMOVE THE OLD PATCH. PEEL THE BACK OFF  THE PATCH AND PUT IT ON CLEAN,DRY, HAIR-FREE SKIN ON THE UPPER ARM, CHEST OR BACK (Patient not taking: Reported on 04/28/2023)     No current facility-administered medications for this visit.      Marland Kitchen  PHYSICAL EXAMINATION: ECOG PERFORMANCE STATUS: 0 - Asymptomatic  Vitals:   07/04/23 0947  BP: 139/82  Pulse: 72  Temp: 97.9 F (36.6 C)  SpO2: 100%    Filed Weights   07/04/23 0947  Weight: 168 lb 9.6 oz (76.5 kg)     Physical Exam Constitutional:      Comments: Patient ambulating independently.  He is accompanied by wife/significant other.  HENT:     Head: Normocephalic and atraumatic.     Mouth/Throat:     Pharynx: No oropharyngeal exudate.  Eyes:     Pupils: Pupils are equal, round, and reactive to light.  Cardiovascular:     Rate and Rhythm: Normal rate and regular rhythm.  Pulmonary:     Effort: No respiratory distress.     Breath sounds: No wheezing.     Comments: Decreased air entry bilaterally. Abdominal:     General: Bowel sounds are normal. There is no distension.     Palpations: Abdomen is soft. There is no mass.     Tenderness: There is no abdominal tenderness. There is no guarding or rebound.  Musculoskeletal:        General: No tenderness. Normal range of motion.     Cervical back: Normal range of motion and neck supple.  Skin:    General: Skin is warm.  Neurological:     Mental Status: He is alert and oriented to person, place, and time.  Psychiatric:        Mood and Affect: Affect normal.      LABORATORY DATA:  I have reviewed the data as listed Lab Results  Component Value Date   WBC 6.5 06/21/2023   HGB 14.5 06/21/2023   HCT 42.4 06/21/2023   MCV 89.8 06/21/2023   PLT 156 06/21/2023   Recent Labs    12/25/22 0910 04/28/23 0939 06/20/23 0942 06/21/23 0730  NA 131* 131* 135 132*  K 4.2 4.7 3.6 3.7  CL 100 103 106 101  CO2 21* 22 21* 20*  GLUCOSE 245* 277* 235* 198*  BUN 18 34* 17 18  CREATININE 1.31* 1.41* 0.94 0.93  CALCIUM  8.8* 9.4 8.9 9.1  GFRNONAA >60 56* >60 >60  PROT 7.7 8.0 7.6  --   ALBUMIN 4.0 4.1 3.9  --   AST 20 21 21   --   ALT 16 19 21   --   ALKPHOS 67 71 62  --   BILITOT 0.6 0.5 0.4  --   BILIDIR  --   --  <0.1  --   IBILI  --   --  NOT CALCULATED  --     RADIOGRAPHIC STUDIES: I have personally reviewed the radiological images as listed and agreed with the findings in the report. US THORACENTESIS ASP PLEURAL SPACE W/IMG GUIDE  Result Date: 06/23/2023 INDICATION: Patient with history of CML, chronic pancreatitis, active smoker presents with shortness of breath. Found to have bilateral pleural effusions. Request made for thoracentesis. EXAM: ULTRASOUND GUIDED RIGHT THORACENTESIS MEDICATIONS: 10 mL 1% lidocaine COMPLICATIONS: None immediate. PROCEDURE: An ultrasound guided thoracentesis was thoroughly discussed with the patient and questions answered. The benefits, risks, alternatives and complications were also discussed. The patient understands and wishes to proceed with the procedure. Written consent was obtained. Ultrasound was performed to localize and mark an adequate pocket of fluid in the right chest. The area was then prepped and draped in the normal sterile fashion. 1% Lidocaine was used for local anesthesia. Under ultrasound guidance a 6 Fr Safe-T-Centesis catheter was introduced. Thoracentesis was performed. The catheter was removed and a dressing applied. FINDINGS: A total of approximately 1.4 liters of dark yellow fluid was removed. Samples were sent to the laboratory as requested by the clinical team. IMPRESSION: Successful ultrasound guided right thoracentesis yielding 1.4 liters of pleural fluid. Performed by: Loyce Dys PA-C Electronically Signed   By: Olive Bass M.D.   On: 06/23/2023 08:02   ECHOCARDIOGRAM COMPLETE  Result Date: 06/20/2023    ECHOCARDIOGRAM REPORT   Patient Name:   Frank [name] Date of Exam: 06/20/2023 Medical Rec #:  [id number]      Height:       68.0 in  Accession #:    [id number]     Weight:       171.3 lb Date of Birth:  01/06/1961      BSA:          1.914 m Patient Age:    61 years       BP:           160/98 mmHg Patient Gender: M              HR:           62 bpm. Exam Location:  [facility] Procedure: 2D Echo, Cardiac Doppler and Color Doppler Indications:     Dyspnea  History:         Patient has no prior history of Echocardiogram examinations.                  Signs/Symptoms:Dyspnea; Risk Factors:Hypertension, Diabetes and                  Dyslipidemia. CKD, Pleural effusion.  Sonographer:     Mikki Harbor Referring Phys:  [id number] Marcelino Duster Diagnosing Phys: Julien Nordmann MD  Sonographer Comments: Technically difficult study due to poor echo windows. Image acquisition challenging due to respiratory motion. IMPRESSIONS  1. Left ventricular ejection fraction,  by estimation, is 60 to 65%. The left ventricle has normal function. The left ventricle has no regional wall motion abnormalities. Left ventricular diastolic parameters are consistent with Grade I diastolic dysfunction (impaired relaxation).  2. Right ventricular systolic function is normal. The right ventricular size is normal. There is normal pulmonary artery systolic pressure.  3. The mitral valve is normal in structure. No evidence of mitral valve regurgitation. No evidence of mitral stenosis.  4. The aortic valve is normal in structure. Aortic valve regurgitation is not visualized. Aortic valve sclerosis is present, with no evidence of aortic valve stenosis.  5. The inferior vena cava is normal in size with greater than 50% respiratory variability, suggesting right atrial pressure of 3 mmHg. FINDINGS  Left Ventricle: Left ventricular ejection fraction, by estimation, is 60 to 65%. The left ventricle has normal function. The left ventricle has no regional wall motion abnormalities. The left ventricular internal cavity size was normal in size. There is  no left ventricular hypertrophy. Left  ventricular diastolic parameters are consistent with Grade I diastolic dysfunction (impaired relaxation). Right Ventricle: The right ventricular size is normal. No increase in right ventricular wall thickness. Right ventricular systolic function is normal. There is normal pulmonary artery systolic pressure. The tricuspid regurgitant velocity is 2.20 m/s, and  with an assumed right atrial pressure of 3 mmHg, the estimated right ventricular systolic pressure is 22.4 mmHg. Left Atrium: Left atrial size was normal in size. Right Atrium: Right atrial size was normal in size. Pericardium: There is no evidence of pericardial effusion. Mitral Valve: The mitral valve is normal in structure. There is mild calcification of the mitral valve leaflet(s). No evidence of mitral valve regurgitation. No evidence of mitral valve stenosis. MV peak gradient, 3.6 mmHg. The mean mitral valve gradient  is 1.0 mmHg. Tricuspid Valve: The tricuspid valve is normal in structure. Tricuspid valve regurgitation is not demonstrated. No evidence of tricuspid stenosis. Aortic Valve: The aortic valve is normal in structure. Aortic valve regurgitation is not visualized. Aortic valve sclerosis is present, with no evidence of aortic valve stenosis. Aortic valve mean gradient measures 3.0 mmHg. Aortic valve peak gradient measures 5.5 mmHg. Aortic valve area, by VTI measures 2.05 cm. Pulmonic Valve: The pulmonic valve was normal in structure. Pulmonic valve regurgitation is not visualized. No evidence of pulmonic stenosis. Aorta: The aortic root is normal in size and structure. Venous: The inferior vena cava is normal in size with greater than 50% respiratory variability, suggesting right atrial pressure of 3 mmHg. IAS/Shunts: No atrial level shunt detected by color flow Doppler.  LEFT VENTRICLE PLAX 2D LVIDd:         4.00 cm   Diastology LVIDs:         2.20 cm   LV e' medial:    7.94 cm/s LV PW:         0.80 cm   LV E/e' medial:  9.4 LV IVS:        0.90  cm   LV e' lateral:   7.51 cm/s LVOT diam:     1.90 cm   LV E/e' lateral: 10.0 LV SV:         67 LV SV Index:   35 LVOT Area:     2.84 cm  RIGHT VENTRICLE RV Basal diam:  3.00 cm RV Mid diam:    2.80 cm RV S prime:     11.50 cm/s TAPSE (M-mode): 2.4 cm LEFT ATRIUM  Index        RIGHT ATRIUM           Index LA diam:      2.90 cm 1.52 cm/m   RA Area:     13.80 cm LA Vol (A2C): 34.6 ml 18.08 ml/m  RA Volume:   37.40 ml  19.55 ml/m LA Vol (A4C): 50.2 ml 26.23 ml/m  AORTIC VALVE                    PULMONIC VALVE AV Area (Vmax):    2.45 cm     PV Vmax:       1.15 m/s AV Area (Vmean):   2.34 cm     PV Peak grad:  5.3 mmHg AV Area (VTI):     2.05 cm AV Vmax:           117.00 cm/s AV Vmean:          82.100 cm/s AV VTI:            0.329 m AV Peak Grad:      5.5 mmHg AV Mean Grad:      3.0 mmHg LVOT Vmax:         101.00 cm/s LVOT Vmean:        67.900 cm/s LVOT VTI:          0.238 m LVOT/AV VTI ratio: 0.72  AORTA Ao Root diam: 3.60 cm MITRAL VALVE               TRICUSPID VALVE MV Area (PHT): 3.33 cm    TR Peak grad:   19.4 mmHg MV Area VTI:   2.58 cm    TR Vmax:        220.00 cm/s MV Peak grad:  3.6 mmHg MV Mean grad:  1.0 mmHg    SHUNTS MV Vmax:       0.95 m/s    Systemic VTI:  0.24 m MV Vmean:      55.3 cm/s   Systemic Diam: 1.90 cm MV Decel Time: 228 msec MV E velocity: 74.90 cm/s MV A velocity: 79.70 cm/s MV E/A ratio:  0.94 Julien Nordmann MD Electronically signed by Julien Nordmann MD Signature Date/Time: 06/20/2023/5:36:16 PM    Final    DG Chest Port 1 View  Result Date: 06/20/2023 CLINICAL DATA:  Pleural effusion, status post thoracentesis EXAM: PORTABLE CHEST 1 VIEW COMPARISON:  CT done on 06/20/2023 FINDINGS: There is significant interval decrease in right pleural effusion. There is small residual right pleural effusion. There is moderate left pleural effusion. There is no demonstrable apical pneumothorax. Cardiac size is within normal limits. There are no signs of pulmonary edema. There are  patchy infiltrates in left mid and both lower lung fields. IMPRESSION: There is significant interval decrease in right pleural effusion. There is moderate left pleural effusion. Infiltrates are seen in left mid and both lower lung fields suggesting atelectasis/pneumonia. There is no demonstrable pneumothorax. Electronically Signed   By: Ernie Avena M.D.   On: 06/20/2023 16:54   CT ABDOMEN PELVIS W CONTRAST  Result Date: 06/20/2023 CLINICAL DATA:  Pleural effusion. Malignancy suspected. History of CML. * Tracking Code: BO * EXAM: CT CHEST without contrast CT ABDOMEN AND PELVIS WITH CONTRAST TECHNIQUE: Multidetector CT imaging of the chest, abdomen and pelvis was performed. Chest without contrast. Followin abdomen and pelvis CT following g the standard protocol during bolus administration of intravenous contrast. RADIATION DOSE REDUCTION: This exam was performed according to the departmental  dose-optimization program which includes automated exposure control, adjustment of the mA and/or kV according to patient size and/or use of iterative reconstruction technique. CONTRAST:  OMNIPAQUE IOHEXOL 300 MG/ML  SOLN COMPARISON:  CT angiogram 01/21/2021. abdomen pelvis CT 09/13/2014 FINDINGS: CT CHEST FINDINGS Cardiovascular: Coronary artery calcifications are seen. Please correlate for other coronary risk factors. Trace pericardial fluid. The heart is nonenlarged. On this non IV contrast exam the thoracic aorta has a normal course and caliber. Mediastinum/Nodes: No specific abnormal lymph node enlargement identified in the axillary regions, hilum or mediastinum. There are a few small less than 1 cm size nodes identified, nonpathologic by size criteria. Normal caliber thoracic esophagus. Preserved thyroid gland. Lungs/Pleura: Large bilateral pleural effusions. Adjacent parenchymal opacities. No pneumothorax. Musculoskeletal: Mild degenerative changes along the spine. CT ABDOMEN PELVIS FINDINGS Hepatobiliary:  No focal liver abnormality is seen. No gallstones, gallbladder wall thickening, or biliary dilatation. Pancreas: The pancreas has multiple punctate calcifications, parenchymal atrophy and dilated pancreatic duct. Please correlate for any history of chronic calcific pancreatitis. Appearance is similar to that of the previous examination. Spleen: Normal in size without focal abnormality. Adrenals/Urinary Tract: The adrenal glands are preserved. No enhancing renal mass or collecting system dilatation. The ureters have normal course and caliber down to the bladder. Preserved contours of the urinary bladder. Nonspecific bilateral perinephric stranding. Stomach/Bowel: On this non oral contrast exam, large bowel is of normal course and caliber with mild-to-moderate colonic stool. Normal caliber appendix. The stomach and small bowel are nondilated. There are some distal small bowel stool appearance, nonspecific. Vascular/Lymphatic: Moderate vascular calcifications along the aorta and branch vessels. Normal caliber aorta and IVC. No specific abnormal lymph node enlargement identified in the abdomen and pelvis. Reproductive: Prostate is unremarkable. Other: No free air or free fluid. Musculoskeletal: Scattered degenerative changes of the spine and pelvis. IMPRESSION: Large bilateral pleural effusions.  Adjacent opacities. Atrophic pancreas with multiple calcifications and dilated duct seen previously. Please correlate for any history of known chronic calcific pancreatitis. No bowel obstruction.  No free air or free fluid. No developing mass lesion or lymph node enlargement in the chest, abdomen and pelvis at this time. Electronically Signed   By: Karen Kays M.D.   On: 06/20/2023 11:25   CT CHEST WO CONTRAST  Result Date: 06/20/2023 CLINICAL DATA:  Pleural effusion. Malignancy suspected. History of CML. * Tracking Code: BO * EXAM: CT CHEST without contrast CT ABDOMEN AND PELVIS WITH CONTRAST TECHNIQUE: Multidetector CT  imaging of the chest, abdomen and pelvis was performed. Chest without contrast. Followin abdomen and pelvis CT following g the standard protocol during bolus administration of intravenous contrast. RADIATION DOSE REDUCTION: This exam was performed according to the departmental dose-optimization program which includes automated exposure control, adjustment of the mA and/or kV according to patient size and/or use of iterative reconstruction technique. CONTRAST:  OMNIPAQUE IOHEXOL 300 MG/ML  SOLN COMPARISON:  CT angiogram 01/21/2021. abdomen pelvis CT 09/13/2014 FINDINGS: CT CHEST FINDINGS Cardiovascular: Coronary artery calcifications are seen. Please correlate for other coronary risk factors. Trace pericardial fluid. The heart is nonenlarged. On this non IV contrast exam the thoracic aorta has a normal course and caliber. Mediastinum/Nodes: No specific abnormal lymph node enlargement identified in the axillary regions, hilum or mediastinum. There are a few small less than 1 cm size nodes identified, nonpathologic by size criteria. Normal caliber thoracic esophagus. Preserved thyroid gland. Lungs/Pleura: Large bilateral pleural effusions. Adjacent parenchymal opacities. No pneumothorax. Musculoskeletal: Mild degenerative changes along the spine. CT ABDOMEN PELVIS  FINDINGS Hepatobiliary: No focal liver abnormality is seen. No gallstones, gallbladder wall thickening, or biliary dilatation. Pancreas: The pancreas has multiple punctate calcifications, parenchymal atrophy and dilated pancreatic duct. Please correlate for any history of chronic calcific pancreatitis. Appearance is similar to that of the previous examination. Spleen: Normal in size without focal abnormality. Adrenals/Urinary Tract: The adrenal glands are preserved. No enhancing renal mass or collecting system dilatation. The ureters have normal course and caliber down to the bladder. Preserved contours of the urinary bladder. Nonspecific bilateral  perinephric stranding. Stomach/Bowel: On this non oral contrast exam, large bowel is of normal course and caliber with mild-to-moderate colonic stool. Normal caliber appendix. The stomach and small bowel are nondilated. There are some distal small bowel stool appearance, nonspecific. Vascular/Lymphatic: Moderate vascular calcifications along the aorta and branch vessels. Normal caliber aorta and IVC. No specific abnormal lymph node enlargement identified in the abdomen and pelvis. Reproductive: Prostate is unremarkable. Other: No free air or free fluid. Musculoskeletal: Scattered degenerative changes of the spine and pelvis. IMPRESSION: Large bilateral pleural effusions.  Adjacent opacities. Atrophic pancreas with multiple calcifications and dilated duct seen previously. Please correlate for any history of known chronic calcific pancreatitis. No bowel obstruction.  No free air or free fluid. No developing mass lesion or lymph node enlargement in the chest, abdomen and pelvis at this time. Electronically Signed   By: Karen Kays M.D.   On: 06/20/2023 11:25   CT L-SPINE NO CHARGE  Result Date: 06/20/2023 CLINICAL DATA:  Low back pain, cancer suspected back pain, history of leukemia EXAM: CT LUMBAR SPINE WITHOUT CONTRAST TECHNIQUE: Multidetector CT imaging of the lumbar spine was performed without intravenous contrast administration. Multiplanar CT image reconstructions were also generated. RADIATION DOSE REDUCTION: This exam was performed according to the departmental dose-optimization program which includes automated exposure control, adjustment of the mA and/or kV according to patient size and/or use of iterative reconstruction technique. COMPARISON:  Lumbar spine MRI 12/07/20 FINDINGS: Segmentation: 5 lumbar type vertebrae. Alignment: Normal. Vertebrae: No acute fracture or focal pathologic process. Paraspinal and other soft tissues: Bilateral pleural effusions. See separately dictated CT abdomen and pelvis for  visceral findings. Disc levels: No evidence of high-grade spinal canal stenosis. IMPRESSION: 1. No acute fracture or traumatic listhesis. 2. No evidence of high-grade spinal canal stenosis. 3. See separately dictated CT abdomen and pelvis for visceral findings. Electronically Signed   By: Lorenza Cambridge M.D.   On: 06/20/2023 11:10    ASSESSMENT & PLAN:   Chronic myeloid leukemia (HCC) #Chronic myeloid leukemia- on dasatinib 70 mg/day [1st week of April, 2022].  Tolerating well-except for nausea see below. JUNE 2024 NEGATIVE for bcr-abl. FISH bcr-abl- negative.   # Discontinue dasatinib because of bilateral pleural effusions.  Discussed regarding alternative TKI.  Less likely neratinib given diabetes/PVD.  Consider Bosutinib vs other options.   #  bilateral pleural effusion-s/p  postthoracentesis.  [Lymphocytic]-  Suspect to be from Dasatinib.  Dasatinib discontinued.  Check chest x-ray.  # Intermittent Nausea- sec to Dasatinib vs others [gastroparesis]-  zofran not helping; continue compazine. Recommend evaluation with GI -[facility] evaluation.   # right tennis elbow- ince; topical NSAIDs prn.   #Mild anemia/CKD--hemoglobin 12.5 - saturation 10%; continued dietary [liver]- Stable.   #Reflux-ron PPIs- stable.   #Diabetes-poorly controlled; currently on insulin pump- BG- 277  [VA]. stable.   # CKD- stage III- GFR 57- stable.   Monday- pref/wife off work  # DISPOSITION:  # CXR re: effusions # labs- 08/12 # follow  up in sep 9th-MD;  cbc/cmp/magnesium-  Dr.B  # 40 minutes face-to-face with the patient discussing the above plan of care; more than 50% of time spent on prognosis/ natural history; counseling and coordination.    All questions were answered. The patient knows to call the clinic with any problems, questions or concerns.     Frank Coder, MD 07/04/2023 10:38 AM

## 2023-07-04 NOTE — Progress Notes (Signed)
Asking about possible side effects of the rx you are going to start him on.

## 2023-07-07 ENCOUNTER — Other Ambulatory Visit (HOSPITAL_COMMUNITY): Payer: Self-pay

## 2023-07-07 ENCOUNTER — Telehealth: Payer: Self-pay | Admitting: Internal Medicine

## 2023-07-07 ENCOUNTER — Telehealth: Payer: Self-pay

## 2023-07-07 ENCOUNTER — Inpatient Hospital Stay: Payer: 59

## 2023-07-07 DIAGNOSIS — C9210 Chronic myeloid leukemia, BCR/ABL-positive, not having achieved remission: Secondary | ICD-10-CM

## 2023-07-07 LAB — CBC WITH DIFFERENTIAL (CANCER CENTER ONLY)
Abs Immature Granulocytes: 0.01 10*3/uL (ref 0.00–0.07)
Basophils Absolute: 0 10*3/uL (ref 0.0–0.1)
Basophils Relative: 1 %
Eosinophils Absolute: 0.3 10*3/uL (ref 0.0–0.5)
Eosinophils Relative: 6 %
HCT: 41.4 % (ref 39.0–52.0)
Hemoglobin: 13.7 g/dL (ref 13.0–17.0)
Immature Granulocytes: 0 %
Lymphocytes Relative: 19 %
Lymphs Abs: 1.1 10*3/uL (ref 0.7–4.0)
MCH: 30.6 pg (ref 26.0–34.0)
MCHC: 33.1 g/dL (ref 30.0–36.0)
MCV: 92.4 fL (ref 80.0–100.0)
Monocytes Absolute: 0.6 10*3/uL (ref 0.1–1.0)
Monocytes Relative: 10 %
Neutro Abs: 3.6 10*3/uL (ref 1.7–7.7)
Neutrophils Relative %: 64 %
Platelet Count: 165 10*3/uL (ref 150–400)
RBC: 4.48 MIL/uL (ref 4.22–5.81)
RDW: 13.3 % (ref 11.5–15.5)
WBC Count: 5.6 10*3/uL (ref 4.0–10.5)
nRBC: 0 % (ref 0.0–0.2)

## 2023-07-07 LAB — CMP (CANCER CENTER ONLY)
ALT: 20 U/L (ref 0–44)
AST: 22 U/L (ref 15–41)
Albumin: 3.8 g/dL (ref 3.5–5.0)
Alkaline Phosphatase: 60 U/L (ref 38–126)
Anion gap: 9 (ref 5–15)
BUN: 25 mg/dL — ABNORMAL HIGH (ref 8–23)
CO2: 20 mmol/L — ABNORMAL LOW (ref 22–32)
Calcium: 9.1 mg/dL (ref 8.9–10.3)
Chloride: 107 mmol/L (ref 98–111)
Creatinine: 1.14 mg/dL (ref 0.61–1.24)
GFR, Estimated: >60 mL/min (ref >60)
Glucose, Bld: 269 mg/dL — ABNORMAL HIGH (ref 70–99)
Potassium: 3.7 mmol/L (ref 3.5–5.1)
Sodium: 136 mmol/L (ref 135–145)
Total Bilirubin: 0.6 mg/dL (ref 0.3–1.2)
Total Protein: 7.4 g/dL (ref 6.5–8.1)

## 2023-07-07 LAB — MAGNESIUM: Magnesium: 2 mg/dL (ref 1.7–2.4)

## 2023-07-07 NOTE — Telephone Encounter (Signed)
Oral Chemotherapy Pharmacist Encounter   Received new prescription for Scemblix (asciminib) for the treatment of chronic phase CML, planned duration until disease progression or unacceptable drug toxicity. Patient previously on dasatinib, but discontinued due to intolerability. MD would like to switch to asciminib.   Labs from 07/07/23 assessed, no baseline dose adjustments required. Prescription dose and frequency assessed.  Current medication list in Epic reviewed, DDI with Scemblix identified: Rosuvastatin: Category X interaction causing increased serum concentrations rosuvastatin due to asciminib mediated inhibition of BCRP and OATP1B1/1B3 transporters responsible for rosuvastatin disposition. Patient will be switched to simvastatin and will be monitored for signs of elevated statin levels (i.e. rhabdomyolysis)  MD plans to have patient stop rosuvastatin and coordinate with cardiology to switch patient's statin. Of note atorvastatin has same drug interaction, simvastatin will be an acceptable alternative but would need to be monitored for increase adverse events.   Evaluated chart and no patient barriers to medication adherence identified.   Prescription has been e-scribed to the [facility] for benefits analysis and approval.  Oral Oncology Clinic will continue to follow for insurance authorization, copayment issues, initial counseling and start date.   Jerry Caras, PharmD PGY2 Oncology Pharmacy Resident   07/07/2023 12:55 PM

## 2023-07-07 NOTE — Telephone Encounter (Signed)
Pt is here for lab and came to scheduling desk to get a message to Dr. B, stated that the pharmacist never contacted him about his change of chemo medication

## 2023-07-07 NOTE — Telephone Encounter (Signed)
Plan to start pt on Asciminib- interaction with Statin. Discussed with Alysson.  Appt on 09/09; labs- 08/26  GB

## 2023-07-07 NOTE — Telephone Encounter (Signed)
Oral Oncology Patient Advocate Encounter  New authorization   Received notification that prior authorization for Scemblix is required.   PA submitted on 07/07/23  Key [id number]  Status is pending     Ardeen Fillers, CPhT Oncology Pharmacy Patient Advocate  [facility]  [phone] (phone) [phone] (fax) 07/07/2023 2:19 PM

## 2023-07-07 NOTE — Telephone Encounter (Signed)
Oral Oncology Patient Advocate Encounter  Prior Authorization for Scemblix has been approved.    PA#  [id number]  Effective dates: 07/07/23 through 11/25/23  Patients co-pay is $0.00.   Scemblix is now a Limited Distribution Drug and must be filled with [facility].  Script and all supporting documentation sent to [facility] for processing and fulfillment.    Ardeen Fillers, CPhT Oncology Pharmacy Patient Advocate  [facility]  [phone] (phone) [phone] (fax) 07/07/2023 2:46 PM

## 2023-07-08 ENCOUNTER — Other Ambulatory Visit (HOSPITAL_COMMUNITY): Payer: Self-pay

## 2023-07-08 MED ORDER — ASCIMINIB HCL 40 MG PO TABS
80.0000 mg | ORAL_TABLET | Freq: Every day | ORAL | 0 refills | Status: DC
Start: 2023-07-08 — End: 2023-07-31

## 2023-07-08 NOTE — Telephone Encounter (Signed)
Clinical Pharmacist Practitioner Encounter   Patient Education I spoke with patient for overview of new oral chemotherapy medication: Scemblix (asciminib) for the treatment of chronic phase CML, planned duration until disease progression or unacceptable drug toxicity. Patient previously on dasatinib, but discontinued due to intolerability. MD would like to switch to asciminib.   Counseled patient on administration, dosing, side effects, monitoring, drug-food interactions, safe handling, storage, and disposal. Patient will take 2 tablets (80 mg total) by mouth daily. Take on empty stomach, at least one hour before or two hours after food. .  Side effects include but not limited to: rash, nausea, muscle pain, decreased wbc/plt/hgb, fatigue.    Drug-drug interaction: See assessment note for detail on asciminib-rosuvastatin interaction Reviewed interaction with patient, he will stop his rosuvastatin today  Per patient, rosuvastatin is prescribed by Jodi Marble, NP from [facility]. Called and LVM with the prescribing office to discuss DDI and need to change medication. Office staff stated they would return my call.   Reviewed with patient importance of keeping a medication schedule and plan for any missed doses.  After discussion with patient no patient barriers to medication adherence identified.   Mr. Musick voiced understanding and appreciation. All questions answered. Medication handout provided.  Provided patient with Oral Chemotherapy Navigation Clinic phone number. Patient knows to call the office with questions or concerns. Oral Chemotherapy Navigation Clinic will continue to follow.  Remi Haggard, PharmD, BCPS, BCOP, CPP Hematology/Oncology Clinical Pharmacist Practitioner [facility] [phone]  07/08/2023 1:22 PM

## 2023-07-10 NOTE — Telephone Encounter (Signed)
Medication shipped from [facility] on 07/09/23 for delivery to patient today, 07/10/23.    Ardeen Fillers, CPhT Oncology Pharmacy Patient Advocate  [facility]  [phone] (phone) [phone] (fax) 07/10/2023 8:01 AM

## 2023-07-14 ENCOUNTER — Ambulatory Visit
Admission: RE | Admit: 2023-07-14 | Discharge: 2023-07-14 | Disposition: A | Discharge status: Home / Self Care | Payer: 59 | Source: Ambulatory Visit | Attending: Internal Medicine | Admitting: Internal Medicine

## 2023-07-14 ENCOUNTER — Ambulatory Visit
Admission: RE | Admit: 2023-07-14 | Discharge: 2023-07-14 | Disposition: A | Discharge status: Home / Self Care | Payer: 59 | Attending: Internal Medicine | Admitting: Internal Medicine

## 2023-07-14 DIAGNOSIS — C9210 Chronic myeloid leukemia, BCR/ABL-positive, not having achieved remission: Secondary | ICD-10-CM | POA: Insufficient documentation

## 2023-07-14 DIAGNOSIS — M25521 Pain in right elbow: Secondary | ICD-10-CM | POA: Insufficient documentation

## 2023-07-14 DIAGNOSIS — J90 Pleural effusion, not elsewhere classified: Secondary | ICD-10-CM | POA: Diagnosis present

## 2023-07-14 NOTE — Telephone Encounter (Signed)
Received the following response from Jodi Marble, NP  "I got a call about a DDI with new cancer drug and statin...my thinking is that we should just d/c statin because the new med seems more important. I'll d/c from my end."

## 2023-07-16 ENCOUNTER — Telehealth: Payer: Self-pay | Admitting: *Deleted

## 2023-07-16 NOTE — Telephone Encounter (Signed)
Called placed to patient informed him the x-ray showed some fluid around the lung.  However this is much better/improved compared to previous chest x-ray.  Likely the fluid would improve over time since he is off the cancer pill.   Will discuss this further at next visit.  Also the elbow x-ray did not show any fractures.  Likely inflammation of the tendons-recommend Tylenol/ice pack.  If not improved recommend follow-up with PCP. Pt understands above.

## 2023-07-21 ENCOUNTER — Inpatient Hospital Stay: Payer: 59

## 2023-07-21 DIAGNOSIS — C9210 Chronic myeloid leukemia, BCR/ABL-positive, not having achieved remission: Secondary | ICD-10-CM | POA: Diagnosis not present

## 2023-07-21 LAB — FERRITIN: Ferritin: 78 ng/mL (ref 24–336)

## 2023-07-21 LAB — CMP (CANCER CENTER ONLY)
ALT: 15 U/L (ref 0–44)
AST: 20 U/L (ref 15–41)
Albumin: 3.7 g/dL (ref 3.5–5.0)
Alkaline Phosphatase: 62 U/L (ref 38–126)
Anion gap: 6 (ref 5–15)
BUN: 26 mg/dL — ABNORMAL HIGH (ref 8–23)
CO2: 19 mmol/L — ABNORMAL LOW (ref 22–32)
Calcium: 8.5 mg/dL — ABNORMAL LOW (ref 8.9–10.3)
Chloride: 105 mmol/L (ref 98–111)
Creatinine: 1.28 mg/dL — ABNORMAL HIGH (ref 0.61–1.24)
GFR, Estimated: >60 mL/min (ref >60)
Glucose, Bld: 351 mg/dL — ABNORMAL HIGH (ref 70–99)
Potassium: 3.9 mmol/L (ref 3.5–5.1)
Sodium: 130 mmol/L — ABNORMAL LOW (ref 135–145)
Total Bilirubin: 0.4 mg/dL (ref 0.3–1.2)
Total Protein: 7.4 g/dL (ref 6.5–8.1)

## 2023-07-21 LAB — CBC WITH DIFFERENTIAL (CANCER CENTER ONLY)
Abs Immature Granulocytes: 0.01 10*3/uL (ref 0.00–0.07)
Basophils Absolute: 0 10*3/uL (ref 0.0–0.1)
Basophils Relative: 0 %
Eosinophils Absolute: 0.4 10*3/uL (ref 0.0–0.5)
Eosinophils Relative: 8 %
HCT: 40.4 % (ref 39.0–52.0)
Hemoglobin: 13.7 g/dL (ref 13.0–17.0)
Immature Granulocytes: 0 %
Lymphocytes Relative: 22 %
Lymphs Abs: 1.1 10*3/uL (ref 0.7–4.0)
MCH: 30.4 pg (ref 26.0–34.0)
MCHC: 33.9 g/dL (ref 30.0–36.0)
MCV: 89.8 fL (ref 80.0–100.0)
Monocytes Absolute: 0.4 10*3/uL (ref 0.1–1.0)
Monocytes Relative: 8 %
Neutro Abs: 3 10*3/uL (ref 1.7–7.7)
Neutrophils Relative %: 62 %
Platelet Count: 162 10*3/uL (ref 150–400)
RBC: 4.5 MIL/uL (ref 4.22–5.81)
RDW: 12.9 % (ref 11.5–15.5)
WBC Count: 4.8 10*3/uL (ref 4.0–10.5)
nRBC: 0 % (ref 0.0–0.2)

## 2023-07-21 LAB — IRON AND TIBC
Iron: 72 ug/dL (ref 45–182)
Saturation Ratios: 24 % (ref 17.9–39.5)
TIBC: 301 ug/dL (ref 250–450)
UIBC: 229 ug/dL

## 2023-07-21 LAB — MAGNESIUM: Magnesium: 2 mg/dL (ref 1.7–2.4)

## 2023-07-21 LAB — VITAMIN D 25 HYDROXY (VIT D DEFICIENCY, FRACTURES): Vit D, 25-Hydroxy: 63.74 ng/mL (ref 30–100)

## 2023-07-24 ENCOUNTER — Other Ambulatory Visit: Payer: Self-pay | Admitting: *Deleted

## 2023-07-24 LAB — BCR-ABL1 FISH
Cells Analyzed: 200
Cells Counted: 200

## 2023-07-29 LAB — BCR-ABL1, CML/ALL, PCR, QUANT
E1A2 Transcript: <0.0032 %
Interpretation (BCRAL):: NEGATIVE
b2a2 transcript: <0.0032 %
b3a2 transcript: <0.0032 %

## 2023-07-31 ENCOUNTER — Other Ambulatory Visit: Payer: Self-pay | Admitting: *Deleted

## 2023-07-31 DIAGNOSIS — C9210 Chronic myeloid leukemia, BCR/ABL-positive, not having achieved remission: Secondary | ICD-10-CM

## 2023-07-31 NOTE — Telephone Encounter (Signed)
CMP (Cancer Center only) Order: [id number] Status: Final result     Visible to patient: No (inaccessible in MyChart)     Next appt: 08/04/2023 at 10:45 AM in Oncology Earna Coder, MD)     Dx: Chronic myeloid leukemia (HCC)   0 Result Notes     1 HM Topic          Component Ref Range & Units 10 d ago (07/21/23) 3 wk ago (07/07/23) 1 mo ago (06/21/23) 1 mo ago (06/20/23) 1 mo ago (06/20/23) 3 mo ago (04/28/23) 7 mo ago (12/25/22)  Sodium 135 - 145 mmol/L 130 Low  136 132 Low  135  131 Low  131 Low   Potassium 3.5 - 5.1 mmol/L 3.9 3.7 3.7 3.6  4.7 4.2  Chloride 98 - 111 mmol/L 105 107 101 106  103 100  CO2 22 - 32 mmol/L 19 Low  20 Low  20 Low  21 Low   22 21 Low   Glucose, Bld 70 - 99 mg/dL 401 High  027 High  CM 198 High  CM 235 High  CM  277 High  CM 245 High  CM  Comment: Glucose reference range applies only to samples taken after fasting for at least 8 hours.  BUN 8 - 23 mg/dL 26 High  25 High  18 17  34 High  18  Creatinine 0.61 - 1.24 mg/dL 2.53 High  6.64 4.03 4.74  1.41 High  1.31 High   Calcium 8.9 - 10.3 mg/dL 8.5 Low  9.1 9.1 8.9  9.4 8.8 Low   Total Protein 6.5 - 8.1 g/dL 7.4 7.4   7.6 8.0 7.7  Albumin 3.5 - 5.0 g/dL 3.7 3.8   3.9 4.1 4.0  AST 15 - 41 U/L 20 22   21 21 20   ALT 0 - 44 U/L 15 20   21 19 16   Alkaline Phosphatase 38 - 126 U/L 62 60   62 71 67  Total Bilirubin 0.3 - 1.2 mg/dL 0.4 0.6   0.4 0.5 0.6  GFR, Estimated >60 mL/min >60 >60 CM    56 Low  CM   Comment: (NOTE) Calculated using the CKD-EPI Creatinine Equation (2021)  Anion gap 5 - 15 6 9  CM 11 CM 8 CM  6 CM 10 CM  Comment: Performed at [facility], [address] [place], [place] [address]  Resulting Agency [facility]         Specimen Collected: 07/21/23 10:16 Last Resulted: 07/21/23 10:50      Lab Flowsheet      Order Details      View Encounter      Lab and Collection Details       Routing      Result History    View All Conversations on this Encounter      CM=Additional comments      Result Care Coordination   Patient Communication   Add Comments   Not seen Back to Top     Satisfied Health Maintenance Topics   Back to Top Diabetic kidney evaluation - eGFR measurement (Yearly)  Next due on 07/20/2024      Other Results from 07/21/2023  VITAMIN D 25 Hydroxy (Vit-D Deficiency, Fractures) Order: [id number] Status: Final result      Visible to patient: No (inaccessible in MyChart)      Next appt:  08/04/2023 at 10:45 AM in Oncology Earna Coder, MD)      Dx: Chronic myeloid leukemia (HCC)    0 Result Notes       Component Ref Range & Units 10 d ago 3 mo ago  Vit D, 25-Hydroxy 30 - 100 ng/mL 63.74 23.83 Low  CM  Comment: (NOTE) Vitamin D deficiency has been defined by the Institute of Medicine and an Endocrine Society practice guideline as a level of serum 25-OH vitamin D less than 20 ng/mL (1,2). The Endocrine Society went on to further define vitamin D insufficiency as a level between 21 and 29 ng/mL (2).  1. IOM (Institute of Medicine). 2010. Dietary reference intakes for calcium and D. [place]: [employer]. 2. Holick MF, Binkley [place], Bischoff-Ferrari HA, et al. Evaluation, treatment, and prevention of vitamin D deficiency: an Endocrine Society clinical practice guideline, JCEM. 2011 Jul; 96(7): 1911-30.  Performed at [facility], [address] [place], [place] [address]  Resulting Agency [facility] CLIN LAB [facility] CLIN LAB         Specimen Collected: 07/21/23 10:15 Last Resulted: 07/21/23 14:37      Lab Flowsheet       Order Details       View Encounter       Lab and Collection Details       Routing       Result History     View All Conversations on this Encounter      CM=Additional comments      Result Care Coordination   Patient Communication   Add Comments   Not seen Back to Top       Ferritin Order: [id number] Status: Final result      Visible to patient: No (inaccessible in MyChart)      Next appt: 08/04/2023 at 10:45 AM in Oncology Earna Coder, MD)      Dx: Chronic myeloid leukemia (HCC)    0 Result Notes        Component Ref Range & Units 10 d ago 3 mo ago 2 yr ago  Ferritin 24 - 336 ng/mL 78 27 CM 27 CM  Comment: Performed at [facility], [address] [place], [place] [address]  Resulting Agency [facility] CLIN LAB [facility] CLIN LAB [facility] CLIN LAB         Specimen Collected: 07/21/23 10:15 Last Resulted: 07/21/23 12:17      Lab Flowsheet       Order Details       View Encounter       Lab and Collection Details       Routing       Result History     View All Conversations on this Encounter      CM=Additional comments      Result Care Coordination   Patient Communication   Add Comments   Not seen Back to Top      Iron and TIBC Order: [id number] Status: Final result      Visible to patient: No (inaccessible in MyChart)      Next appt: 08/04/2023 at 10:45 AM in Oncology Earna Coder, MD)      Dx: Chronic myeloid leukemia (HCC)    0 Result Notes         Component Ref Range & Units 10 d ago 3 mo ago 2 yr ago 11 yr ago  Iron 45 - 182 ug/dL 72 73 38 Low  27 Low  R  TIBC 250 - 450 ug/dL 191 478 295   Saturation Ratios 17.9 - 39.5 % 24 20 10  Low    UIBC ug/dL 621 308 CM 657 CM   Comment: Performed at [facility], [address] [place], [place] [address]  Resulting Agency [facility] CLIN LAB CH CLIN LAB CH CLIN LAB [facility] LAB CONVERSION         Specimen Collected: 07/21/23 10:15 Last Resulted: 07/21/23 12:17      Lab Flowsheet       Order Details       View Encounter       Lab and Collection Details       Routing       Result History     View All Conversations on this Encounter      CM=Additional comments  R=Reference range differs from displayed range      Result  Care Coordination   Patient Communication   Add Comments   Not seen Back to Top      Magnesium Order: [id number] Status: Final result      Visible to patient: No (inaccessible in MyChart)      Next appt: 08/04/2023 at 10:45 AM in Oncology Earna Coder, MD)      Dx: Chronic myeloid leukemia (HCC)    0 Result Notes            Component Ref Range & Units 10 d ago (07/21/23) 3 wk ago (07/07/23) 3 mo ago (04/28/23) 7 mo ago (12/25/22) 10 mo ago (09/20/22) 1 yr ago (07/08/22) 1 yr ago (05/06/22)  Magnesium 1.7 - 2.4 mg/dL 2.0 2.0 CM 1.9 CM 1.9 CM 2.0 CM 2.0 CM 2.1 CM  Comment: Performed at [facility], [address] [place], [place] [address]  Resulting Agency [facility] CL[facility] CL[facility] CL[facility] CL[facility] CLIN LAB         Specimen Collected: 07/21/23 10:15 Last Resulted: 07/21/23 10:47      Lab Flowsheet       Order Details       View Encounter       Lab and Collection Details       Routing       Result History     View All Conversations on this Encounter      CM=Additional comments      Result Care Coordination   Patient Communication   Add Comments   Not seen Back to Top      BCR-ABL1 FISH Order: [id number] Status: Final result      Visible to patient: No (inaccessible in MyChart)      Next appt: 08/04/2023 at 10:45 AM in Oncology Earna Coder, MD)      Dx: Chronic myeloid leukemia (HCC)    0 Result Notes      Component 10 d ago  Specimen Type BLOOD  Cells Counted 200  Cells Analyzed 200  FISH Result ABL1 GENE FUSION  Comment: Comment: NO BCR  Interpretation Comment:  Comment: (NOTE) NEGATIVE             nuc ish 9q34(ASS1,ABL1)x2,22q11.2(BCRx2)[200].      The fluorescence in situ hybridization (FISH) study was normal. FISH, using unique sequence DNA probes for the ABL1 and BCR gene regions showed two ABL1 signals (red), two control ASS1 gene signals (aqua) located  adjacent to the ABL1 locus at 9q34, and two BCR signals (green) at 22q11.2  in all interphase nuclei examined. There was NO evidence of CML or ALL-associated BCR::ABL1 dual fusion signals in this analysis. .      This analysis is limited to abnormalities detectable by the specific probes included in the study. FISH results should be interpreted within the context of a full cytogenetic analysis and pathology evaluation.  A BCR::ABL1 gene fusion in greater than 3 interphase nuclei in a patient with a new clinical diagnosis is considered positive. The DNA probe vendor for this study was [employer], community). .      This test was developed and its performance characteristics determined by [facility] of [address] [employer]). It has not been cleared or approved by the U.S. Food and Drug Administration.  Director Review: Comment:  Comment: (NOTE) Miguel Rota, PHD Performed At: [employer] [facility] [address] [place], [place] [id number] Maurine Simmering MDPhD [id number]  Resulting Agency [facility] CLIN LAB         Specimen Collected: 07/21/23 10:15 Last Resulted: 07/24/23 19:35      Lab Flowsheet       Order Details       View Encounter       Lab and Collection Details       Routing       Result History     View All Conversations on this Encounter        Result Care Coordination   Patient Communication   Add Comments   Not seen Back to Top      BCR-ABL1, CML/ALL, PCR, QUANT Order: [id number] Status: Edited Result - FINAL      Visible to patient: No (inaccessible in MyChart)      Next appt: 08/04/2023 at 10:45 AM in Oncology Earna Coder, MD)      Dx: Chronic myeloid leukemia (HCC)    0 Result Notes            Component Ref Range & Units 10 d ago (07/21/23) 3 mo ago (04/28/23) 7 mo ago (12/25/22) 10 mo ago (09/20/22) 1 yr ago (07/08/22) 1 yr ago (05/06/22) 1 yr ago (02/07/22)  b2a2 transcript % <0.0032 % Comment CM  Comment CM Comment CM Comment CM Comment CM Comment CM  b3a2 transcript % <0.0032 % Comment CM Comment CM Comment CM Comment CM Comment CM Comment CM  E1A2 Transcript % <0.0032 % Comment CM Comment CM Comment CM Comment CM 0.4119 Comment CM  Interpretation (BCRAL): Negative Negative CM Negative CM Negative CM Negative CM ABL1 e1a2 (p190) fusion transcript. CM Negative CM  Comment: (NOTE) NEGATIVE for the BCR-ABL1 e1a2 (p190), e13a2 (b2a2, p210) and e14a2 (b3a2, p210) fusion transcripts. These results do not rule out the presence of rare BCR-ABL1 transcripts not detected by this assay.  Director Review [facility]): Comment Comment CM Comment CM Comment CM Comment CM Comment CM Comment CM  Comment: (NOTE) Karilyn Cota, PhD, [facility] Director, Molecular Oncology [facility] [place], [place] [address] [phone]  Background: Comment VC Comment VC, CM Comment VC, CM Comment VC, CM Comment VC, CM Comment VC, CM Comment VC, CM  Comment: (NOTE) This assay can detect three different types of BCR-ABL1 fusion transcripts associated with CML, ALL, and AML: e13a2 (previously b2a2) and e14a2 (previously b3a2) (major breakpoint, p210), as well as e1a2 (minor breakpoint, p190). The e13a2 and e14a2 transcript values are titrated to the current International Scale (IS). The standardized baseline is 100% BCR-ABL1 (IS) and major molecular response (MMR) is equivalent  to 0.1% BCR-ABL1 (IS) corresponding to a 3-log reduction. Results should be correlated with appropriate clinical and laboratory information as indicated.  Methodology Comment VC Comment VC, CM Comment VC, CM Comment VC, CM Comment VC, CM Comment VC, CM Comment VC, CM  Comment: (NOTE) Total RNA is isolated from the sample and subject to a real-time, reverse transcriptase polymerase chain reaction (RT-PCR). The PCR primers and probes are specific for BCR-ABL1 e13a2, e14a2 and e1a2 fusion transcripts. The  ABL1 transcript is amplified as the control for cDNA quantity and quality. Serial dilutions of a validated positive control RNA with known t(9;22) BCR-ABL1 are used as reference for quantification of BCR-ABL1 relative to ABL1. The numeric BCR-ABL1 level is reported as % BCR-ABL1/ABL1 and the detection sensitivity is 4.5 log below the standard baseline (<0.0032%). This test was developed and its performance characteristics determined by [facility]. It has not been cleared or approved by the Food and Drug Administration. Performed At: [employer] [facility] [address] [place], [place] [id number] Maurine Simmering MDPhD [id number] Performed At: [facility] [address] [place], [place] [id number] Maurine Simmering MDPhD [id number]  Resulting Agency [facility] CLIN LAB CH CLIN LAB CH CLIN LAB [facility] CLIN LAB [facility] CLIN LAB [facility] CLIN LAB [facility] CLIN LAB         Specimen Collected: 07/21/23 10:15 Last Resulted: 07/29/23 16:37

## 2023-08-01 ENCOUNTER — Telehealth: Payer: Self-pay | Admitting: *Deleted

## 2023-08-01 DIAGNOSIS — C9210 Chronic myeloid leukemia, BCR/ABL-positive, not having achieved remission: Secondary | ICD-10-CM

## 2023-08-01 MED ORDER — ASCIMINIB HCL 40 MG PO TABS
80.0000 mg | ORAL_TABLET | Freq: Every day | ORAL | 0 refills | Status: DC
Start: 2023-08-01 — End: 2023-11-05

## 2023-08-01 NOTE — Telephone Encounter (Signed)
Will fax refill request for Scemblix to [facility]

## 2023-08-04 ENCOUNTER — Inpatient Hospital Stay: Payer: 59 | Attending: Internal Medicine | Admitting: Internal Medicine

## 2023-08-04 ENCOUNTER — Encounter: Payer: Self-pay | Admitting: Internal Medicine

## 2023-08-04 VITALS — BP 154/89 | HR 66 | Temp 97.3°F | Ht 68.0 in | Wt 166.6 lb

## 2023-08-04 DIAGNOSIS — R0602 Shortness of breath: Secondary | ICD-10-CM | POA: Insufficient documentation

## 2023-08-04 DIAGNOSIS — E1122 Type 2 diabetes mellitus with diabetic chronic kidney disease: Secondary | ICD-10-CM | POA: Diagnosis not present

## 2023-08-04 DIAGNOSIS — Z794 Long term (current) use of insulin: Secondary | ICD-10-CM | POA: Diagnosis not present

## 2023-08-04 DIAGNOSIS — F1290 Cannabis use, unspecified, uncomplicated: Secondary | ICD-10-CM | POA: Insufficient documentation

## 2023-08-04 DIAGNOSIS — D631 Anemia in chronic kidney disease: Secondary | ICD-10-CM | POA: Insufficient documentation

## 2023-08-04 DIAGNOSIS — F1490 Cocaine use, unspecified, uncomplicated: Secondary | ICD-10-CM | POA: Insufficient documentation

## 2023-08-04 DIAGNOSIS — I129 Hypertensive chronic kidney disease with stage 1 through stage 4 chronic kidney disease, or unspecified chronic kidney disease: Secondary | ICD-10-CM | POA: Diagnosis not present

## 2023-08-04 DIAGNOSIS — F1090 Alcohol use, unspecified, uncomplicated: Secondary | ICD-10-CM | POA: Insufficient documentation

## 2023-08-04 DIAGNOSIS — J90 Pleural effusion, not elsewhere classified: Secondary | ICD-10-CM | POA: Diagnosis not present

## 2023-08-04 DIAGNOSIS — C9210 Chronic myeloid leukemia, BCR/ABL-positive, not having achieved remission: Secondary | ICD-10-CM | POA: Insufficient documentation

## 2023-08-04 DIAGNOSIS — R197 Diarrhea, unspecified: Secondary | ICD-10-CM | POA: Diagnosis not present

## 2023-08-04 DIAGNOSIS — R110 Nausea: Secondary | ICD-10-CM | POA: Diagnosis not present

## 2023-08-04 DIAGNOSIS — R6884 Jaw pain: Secondary | ICD-10-CM | POA: Insufficient documentation

## 2023-08-04 DIAGNOSIS — Z720 Tobacco use: Secondary | ICD-10-CM | POA: Insufficient documentation

## 2023-08-04 DIAGNOSIS — K219 Gastro-esophageal reflux disease without esophagitis: Secondary | ICD-10-CM | POA: Insufficient documentation

## 2023-08-04 DIAGNOSIS — N189 Chronic kidney disease, unspecified: Secondary | ICD-10-CM | POA: Diagnosis not present

## 2023-08-04 NOTE — Assessment & Plan Note (Addendum)
#  Chronic myeloid leukemia- on dasatinib 70 mg/day [1st week of April, 2022].  Tolerating well-except for nausea see below. AUG 2024- NEGATIVE for the BCR-ABL1 e1a2 (p190), e13a2 (b2a2, p210) and e14a2 (b3a2, p210) fusion transcripts  FISH bcr-abl- negative.   # in JULY, 2024- Discontinued dasatinib because of bilateral pleural effusions; currently on asciminib 40 mg BID [since Aug 2024]- will refill. Discussed re: statin with pharmacy.   # Bilateral pleural effusion-s/p  postthoracentesis.  [Lymphocytic]-  Suspect to be from Dasatinib. AUG 2024 chest x-ray- improved not resolved.   # Intermittent Nausea- sec to Dasatinib vs others [gastroparesis]-  zofran not helping; continue compazine. Recommend evaluation with GI -[facility] evaluation.   #Mild anemia/CKD--hemoglobin 12.5 - saturation 10%; continued dietary [liver]- stable.   #Reflux-ron PPIs- stable.   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   #Diabetes-poorly controlled; currently on insulin pump- BG- 378  [VA]- stable  # CKD- stage III- GFR 57- stable.   Monday- pref/wife off work  # DISPOSITION:  # follow up in 2 months -MD; PRIOR 2 weeks prior-cbc/cmp; mag; BCL-ABL PCR; - Dr.B

## 2023-08-04 NOTE — Progress Notes (Signed)
[facility] CONSULT NOTE  Patient Care Team: System, Provider Not In as PCP - General Earna Coder, MD as Consulting Physician (Oncology)  CHIEF COMPLAINTS/PURPOSE OF CONSULTATION: CML  #  Oncology History Overview Note  Result Comment:   Comment: (NOTE)  ABNORMAL: 89% OF NUCLEI POSITIVE FOR BCR/ABL1 GENE FUSION  SIGNAL(S)     # MARCH 2022--  Hypercellular bone marrow with involvement by chronic myeloid  leukemia, BCR-ABL1-positive . he bone marrow is hypercellular for age with myeloid and megakaryocytic  hyperplasia.  There is no increase in blasts by manual differential count of aspirate smears (<1%) or by CD34 immunohistochemistry on the core biopsy.  A reticulin special stain reveals no significant increase  in reticulin fibrosis (MF grade 0 of 3).  # April 1st week -DASATINIB 100 mg; July 2022-FISH BCR ABL-negative for 9:22 abnormality [optimal; reached 28-month milestone. MMR at 10 months [gaol at 12];   # FEB 2024-secondary to chronic nausea-decrease dose of dasatinib to 70 mg/day.   # Discontinue dasatinib because of bilateral pleural effusions; currently on asciminib 40 mg BID [since Aug 2024]-    #hypertension /alcoholic liver/ type 2 diabetes on insulin poorly controlled chronic back pain    Chronic myeloid leukemia (HCC)  01/31/2021 Initial Diagnosis   Chronic myeloid leukemia (HCC)    HISTORY OF PRESENTING ILLNESS: Ambulating independently.  Accompanied by his wife.  Frank [name] 62 y.o.  male with-mild end of the problems-CKD; insulin-dependent diabetes; chronic phase CML most recently on Asciminib is here for follow-up.  C/o left jaw pain, 7/10. Stiffness in neck, left side.    Patient is having diarrhea 1-2 times per day, uses Imodium. Denies any worsening diarrhea or abdominal pain.  Patient complains of  shortness of breath- currently smoking again.    Has nausea, no vomiting.  No fever no chills.    Review of Systems   Constitutional:  Positive for malaise/fatigue. Negative for chills, diaphoresis, fever and weight loss.  HENT:  Negative for nosebleeds and sore throat.   Eyes:  Negative for double vision.  Respiratory:  Negative for cough, hemoptysis, sputum production, shortness of breath and wheezing.   Cardiovascular:  Negative for chest pain, palpitations, orthopnea and leg swelling.  Gastrointestinal:  Negative for abdominal pain, blood in stool, constipation, diarrhea, heartburn, melena, nausea and vomiting.  Genitourinary:  Negative for dysuria, frequency and urgency.  Musculoskeletal:  Positive for back pain and joint pain.  Skin: Negative.  Negative for itching and rash.  Neurological:  Negative for dizziness, tingling, focal weakness, weakness and headaches.  Endo/Heme/Allergies:  Does not bruise/bleed easily.  Psychiatric/Behavioral:  Negative for depression. The patient is not nervous/anxious and does not have insomnia.      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 No pertinent surgical history.  SOCIAL HISTORY: Social History   Socioeconomic History   Marital status: Single    Spouse name: Not on file   Number of children: Not on file   Years of education: Not on file   Highest  education level: Not on file  Occupational History   Occupation: retired     Comment: custodian   Tobacco Use   Smoking status: Some Days    Types: Cigarettes   Smokeless tobacco: Never   Tobacco comments:    4 cigarettes yesterday   Vaping Use   Vaping status: Never Used  Substance and Sexual Activity   Alcohol use: Yes     Comment: hx of ETOH abuse   Drug use: Yes    Types: Cocaine, Marijuana    Comment: had cocaine Sat. (2 days ago)   Sexual activity: Not on file  Other Topics Concern   Not on file  Social History Narrative   Lives with S.O. [name]    Social Determinants of Health   Financial Resource Strain: Not on file  Food Insecurity: No Food Insecurity (06/20/2023)   Hunger Vital Sign    Worried About Running Out of Food in the Last Year: Never true    Ran Out of Food in the Last Year: Never true  Transportation Needs: No Transportation Needs (06/20/2023)   PRAPARE - Administrator, Civil Service (Medical): No    Lack of Transportation (Non-Medical): No  Physical Activity: Not on file  Stress: Not on file  Social Connections: Not on file  Intimate Partner Violence: Not At Risk (06/20/2023)   Humiliation, Afraid, Rape, and Kick questionnaire    Fear of Current or Ex-Partner: No    Emotionally Abused: No    Physically Abused: No    Sexually Abused: No    FAMILY HISTORY: Family History  Problem Relation Age of Onset   Diabetes Mother    Hypertension Mother    Heart failure Mother    Hyperlipidemia Mother    Thyroid disease Mother    Alcohol abuse Father    Emphysema Father    Kidney failure Father    Gout Father    Cancer Sister    Hyperlipidemia Sister    Hypertension Sister    Rheum arthritis Sister    Fibromyalgia Sister    Thyroid disease Sister     ALLERGIES:  has No Known Allergies.  MEDICATIONS:  Current Outpatient Medications  Medication Sig Dispense Refill   amLODipine (NORVASC) 10 MG tablet Take 0.5 tablets by mouth daily.     asciminib hcl (SCEMBLIX) 40 MG tablet Take 2 tablets (80 mg total) by mouth daily. Take on empty stomach, at least one hour before or two hours after food. 60 tablet 0   aspirin 81 MG chewable tablet Chew 81 mg by mouth daily.     Azelastine HCl 137 MCG/SPRAY SOLN Place 1 spray into the nose 2 (two) times daily as needed.      Brinzolamide-Brimonidine 1-0.2 % SUSP Place 1 drop into both eyes in the morning, at noon, and at bedtime.     cetirizine (ZYRTEC) 10 MG tablet Take 10 mg by mouth at bedtime.     dorzolamide-timolol (COSOPT) 2-0.5 % ophthalmic solution Place 1 drop into both eyes 2 (two) times daily.     ergocalciferol (VITAMIN D2) 1.25 MG (50000 UT) capsule Take 1 capsule (50,000 Units total) by mouth once a week. 12 capsule 3   fluticasone (FLONASE) 50 MCG/ACT nasal spray INSTILL 1 SPRAY INTO EACH NOSTRIL EVERY DAY AS NEEDED MAXIMUM 2 SPRAYS IN EACH NOSTRIL DAILY. FOR ALLERGIES     insulin aspart (NOVOLOG) 100 UNIT/ML injection Inject 100 Units into the skin continuous. INJECT 100 UNITS UNDER SKIN CONTINUOUS-VIA-PUMP  AS DIRECTED FOR DIABETES.DISCARD BOTTLE 28 DAYS AFTER OPENING     insulin glargine (LANTUS) 100 UNIT/ML injection 12 Units at bedtime.     latanoprost (XALATAN) 0.005 % ophthalmic solution Place 1 drop into both eyes at bedtime.     lipase/protease/amylase (CREON) 36000 UNITS CPEP capsule See admin instructions. TAKE 2 CAPSULES BY MOUTH WITH ACTIVE MEALS AND TAKE 1 CAPSULE WITH SNACK     lisinopril-hydrochlorothiazide (ZESTORETIC) 20-12.5 MG tablet Take 1 tablet by mouth daily.     methocarbamol (ROBAXIN) 500 MG tablet Take 1 tablet (500 mg total) by mouth every 8 (eight) hours as needed for muscle spasms. 90 tablet 1   sodium chloride (OCEAN) 0.65 % nasal spray SPRAY 2 SPRAYS INTO EACH NOSTRIL FOUR TIMES A DAY FOR SINUS CONGESTION     nicotine (NICODERM CQ - DOSED IN MG/24 HOURS) 14 mg/24hr patch Place 14 mg onto the skin daily. APPLY ONE PATCH TO SKIN EVERY DAY ACTIVE APPLY ONE PATCH WHEN YOU WAKE UP AND REMOVE THE OLD PATCH. PEEL THE BACK OFF THE PATCH AND PUT IT ON CLEAN,DRY, HAIR-FREE SKIN ON THE UPPER ARM, CHEST OR BACK (Patient not taking: Reported on 04/28/2023)     ondansetron (ZOFRAN) 8 MG tablet TAKE ONE TABLET BY MOUTH 30-45 MINS PRIOR TO TAKING YOUR CHEMO TO PREVENT NAUSEA (Patient  not taking: Reported on 08/04/2023) 30 tablet 3   pregabalin (LYRICA) 50 MG capsule Take 1 capsule (50 mg total) by mouth 3 (three) times daily. 90 capsule 1   prochlorperazine (COMPAZINE) 10 MG tablet TAKE 1 TABLET BY MOUTH 30-45 MINUTES PRIOR TO TAKING CANCER MEDICATION (Patient not taking: Reported on 08/04/2023) 60 tablet 3   No current facility-administered medications for this visit.    PHYSICAL EXAMINATION: ECOG PERFORMANCE STATUS: 0 - Asymptomatic  Vitals:   08/04/23 1041  BP: (!) 154/89  Pulse: 66  Temp: (!) 97.3 F (36.3 C)  SpO2: 100%    Filed Weights   08/04/23 1041  Weight: 166 lb 9.6 oz (75.6 kg)     Physical Exam Constitutional:      Comments: Patient ambulating independently.  He is accompanied by wife/significant other.  HENT:     Head: Normocephalic and atraumatic.     Mouth/Throat:     Pharynx: No oropharyngeal exudate.  Eyes:     Pupils: Pupils are equal, round, and reactive to light.  Cardiovascular:     Rate and Rhythm: Normal rate and regular rhythm.  Pulmonary:     Effort: No respiratory distress.     Breath sounds: No wheezing.     Comments: Decreased air entry bilaterally. Abdominal:     General: Bowel sounds are normal. There is no distension.     Palpations: Abdomen is soft. There is no mass.     Tenderness: There is no abdominal tenderness. There is no guarding or rebound.  Musculoskeletal:        General: No tenderness. Normal range of motion.     Cervical back: Normal range of motion and neck supple.  Skin:    General: Skin is warm.  Neurological:     Mental Status: He is alert and oriented to person, place, and time.  Psychiatric:        Mood and Affect: Affect normal.    LABORATORY DATA:  I have reviewed the data as listed Lab Results  Component Value Date   WBC 4.8 07/21/2023   HGB 13.7 07/21/2023   HCT 40.4 07/21/2023   MCV 89.8 07/21/2023  PLT 162 07/21/2023   Recent Labs    06/20/23 0942 06/21/23 0730 07/07/23 1025  07/21/23 1016  NA 135 132* 136 130*  K 3.6 3.7 3.7 3.9  CL 106 101 107 105  CO2 21* 20* 20* 19*  GLUCOSE 235* 198* 269* 351*  BUN 17 18 25* 26*  CREATININE 0.94 0.93 1.14 1.28*  CALCIUM 8.9 9.1 9.1 8.5*  GFRNONAA >60 >60 >60 >60  PROT 7.6  --  7.4 7.4  ALBUMIN 3.9  --  3.8 3.7  AST 21  --  22 20  ALT 21  --  20 15  ALKPHOS 62  --  60 62  BILITOT 0.4  --  0.6 0.4  BILIDIR <0.1  --   --   --   IBILI NOT CALCULATED  --   --   --     RADIOGRAPHIC STUDIES: I have personally reviewed the radiological images as listed and agreed with the findings in the report. DG Elbow 2 Views Right  Result Date: 07/14/2023 CLINICAL DATA:  Right elbow pain. EXAM: RIGHT ELBOW - 2 VIEW COMPARISON:  None Available. FINDINGS: There is no evidence of fracture, dislocation, or joint effusion. There is no evidence of arthropathy or other focal bone abnormality. Soft tissues are unremarkable. IMPRESSION: Negative right elbow radiographs. Electronically Signed   By: Marin Roberts M.D.   On: 07/14/2023 14:21   DG Chest 2 View  Result Date: 07/14/2023 CLINICAL DATA:  Cough and shortness of breath. Chronic myeloid leukemia. EXAM: CHEST - 2 VIEW COMPARISON:  One-view chest x-ray 06/20/2023 FINDINGS: Heart size is normal. Bilateral pleural effusions are present. Bibasilar airspace opacities are noted. The upper lung fields are clear. The visualized soft tissues and bony thorax are unremarkable. IMPRESSION: Bilateral pleural effusions and bibasilar airspace disease. While this likely reflects atelectasis, infection is not excluded. Electronically Signed   By: Marin Roberts M.D.   On: 07/14/2023 14:20    ASSESSMENT & PLAN:   Chronic myeloid leukemia (HCC) #Chronic myeloid leukemia- on dasatinib 70 mg/day [1st week of April, 2022].  Tolerating well-except for nausea see below. AUG 2024- NEGATIVE for the BCR-ABL1 e1a2 (p190), e13a2 (b2a2, p210) and e14a2 (b3a2, p210) fusion transcripts  FISH bcr-abl-  negative.   # in JULY, 2024- Discontinued dasatinib because of bilateral pleural effusions; currently on asciminib 40 mg BID [since Aug 2024]- will refill. Discussed re: statin with pharmacy.   # Bilateral pleural effusion-s/p  postthoracentesis.  [Lymphocytic]-  Suspect to be from Dasatinib. AUG 2024 chest x-ray- improved not resolved.   # Intermittent Nausea- sec to Dasatinib vs others [gastroparesis]-  zofran not helping; continue compazine. Recommend evaluation with GI -[facility] evaluation.   #Mild anemia/CKD--hemoglobin 12.5 - saturation 10%; continued dietary [liver]- stable.   #Reflux-ron PPIs- stable.   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   #Diabetes-poorly controlled; currently on insulin pump- BG- 378  [VA]- stable  # CKD- stage III- GFR 57- stable.   Monday- pref/wife off work  # DISPOSITION:  # follow up in 2 months -MD; PRIOR 2 weeks prior-cbc/cmp; mag; BCL-ABL PCR; - Dr.B     All questions were answered. The patient knows to call the clinic with any problems, questions or concerns.     Earna Coder, MD 08/04/2023 12:05 PM

## 2023-08-04 NOTE — Progress Notes (Signed)
C/o left jaw pain, 7/10. Stiffness in neck, left side.  Having diarrhea 1-2 times per day, uses Imodium.

## 2023-08-14 ENCOUNTER — Inpatient Hospital Stay: Payer: 59 | Admitting: Dietician

## 2023-08-14 NOTE — Progress Notes (Signed)
Nutrition Assessment Reached out to patient at home telephone# for a remote follow up on his weight loss.  Reason for Assessment: MST screen for weight loss.    ASSESSMENT: Patient is a 62 year old male with CML who is followed by Dr. Donneta Romberg.  He has PMHX that includes uncontrolled IDDM, AKI, CKD, HTN, pancreatitis, HLD, and anemia.  He reports his weight loss is due to no appetite yet his diet history reflects 3+ meal per day pattern with snacking and some ONS use.  His blood sugar control is poor and reports he just got a new insulin pump this past week.  He denies meeting with a RD for blood sugar management.  He states he want to eat what he wants.  His bowels are regular 2-3 times a day sometimes greasy (he states using creon with each meal and snack).  He has some knowledge of carb counting and told me he uses 1/2 portion of potatoes and applesauce for 15 grams. He has a book to look things up but admits wasn't coving his intake with adequate insulin because he wasn't accounting for all his carbs, he also said he didn't know his beer had carbs either.  He doesn't use much milk reports lactose intolerance. Usual intake: 1-2 am any leftovers or snacks in house, Mitzi Hansen w/milk  1-2 pm Breakfast: grits, eggs, applesauce Lunch: Sandwich meat (ham or [place]) no cheese mayo  Dinner: usually out at [employer] (1 piece of chicken, potato's) or [employer] Big Mac (no fries) apple pie HS: cheese grooves, berries,   Fluids: SF Hawaiian punch, Ensure (regular 2-3 week)  Water 3 bottles, Beer  2-3 week    Labs: 07/21/23  Glucose 351, BUN 26, Creat 1.28  Anthropometrics:   Height: 68" Weight:  08/04/23  166.6# UBW: 180# DBW: 170# BMI: 25.33  NUTRITION DIAGNOSIS: Food and Nutrition Related Knowledge Deficit related to blood sugar control and cancer and associated treatments as evidenced by limited expression of desire to learn and address risk factors for high blood sugars.   INTERVENTION:    Relayed that nutrition services are wrap around service provided at no charge and encouraged continued communication if experiencing continued weight loss or any nutritional impact symptoms (NIS). Reviewed risks and symptoms of elevated blood sugars. Encouraged testing blood sugars twice  daily for a few weeks fasting 2 hours postprandial. Suggested d/c current oral nutrition supplement and continue to eat real foods.  Encouraged zero calorie beverages and decreasing beer but if consuming cover carbs Tried to emailed Nutrition Tip sheet  for  blood sugar management with contact information provided to [email or web address] but it didn't go through. Sent text requesting corrected email with contact to his mobile.   MONITORING, EVALUATION, GOAL: weight, PO intake, Nutrition Impact Symptoms, labs Goal is weight maintenance  Next Visit: PRN at patient or provider request, patient would benefit from seeing the RD in diabetes management  Gennaro Africa, RDN, LDN Registered Dietitian,  [facility] Part Time Remote (Usual office hours: Tuesday-Thursday) Cell: [phone]

## 2023-09-01 ENCOUNTER — Inpatient Hospital Stay: Payer: 59 | Attending: Internal Medicine

## 2023-09-01 DIAGNOSIS — E1122 Type 2 diabetes mellitus with diabetic chronic kidney disease: Secondary | ICD-10-CM | POA: Insufficient documentation

## 2023-09-01 DIAGNOSIS — J90 Pleural effusion, not elsewhere classified: Secondary | ICD-10-CM | POA: Insufficient documentation

## 2023-09-01 DIAGNOSIS — D631 Anemia in chronic kidney disease: Secondary | ICD-10-CM | POA: Insufficient documentation

## 2023-09-01 DIAGNOSIS — N183 Chronic kidney disease, stage 3 unspecified: Secondary | ICD-10-CM | POA: Insufficient documentation

## 2023-09-01 DIAGNOSIS — C9210 Chronic myeloid leukemia, BCR/ABL-positive, not having achieved remission: Secondary | ICD-10-CM

## 2023-09-01 LAB — CMP (CANCER CENTER ONLY)
ALT: 12 U/L (ref 0–44)
AST: 20 U/L (ref 15–41)
Albumin: 3.7 g/dL (ref 3.5–5.0)
Alkaline Phosphatase: 70 U/L (ref 38–126)
Anion gap: 6 (ref 5–15)
BUN: 14 mg/dL (ref 8–23)
CO2: 22 mmol/L (ref 22–32)
Calcium: 8.5 mg/dL — ABNORMAL LOW (ref 8.9–10.3)
Chloride: 104 mmol/L (ref 98–111)
Creatinine: 1.06 mg/dL (ref 0.61–1.24)
GFR, Estimated: >60 mL/min (ref >60)
Glucose, Bld: 277 mg/dL — ABNORMAL HIGH (ref 70–99)
Potassium: 4.1 mmol/L (ref 3.5–5.1)
Sodium: 132 mmol/L — ABNORMAL LOW (ref 135–145)
Total Bilirubin: 0.5 mg/dL (ref 0.3–1.2)
Total Protein: 7.4 g/dL (ref 6.5–8.1)

## 2023-09-01 LAB — CBC WITH DIFFERENTIAL (CANCER CENTER ONLY)
Abs Immature Granulocytes: 0.02 10*3/uL (ref 0.00–0.07)
Basophils Absolute: 0.1 10*3/uL (ref 0.0–0.1)
Basophils Relative: 1 %
Eosinophils Absolute: 0.3 10*3/uL (ref 0.0–0.5)
Eosinophils Relative: 5 %
HCT: 44.3 % (ref 39.0–52.0)
Hemoglobin: 14.7 g/dL (ref 13.0–17.0)
Immature Granulocytes: 0 %
Lymphocytes Relative: 25 %
Lymphs Abs: 1.4 10*3/uL (ref 0.7–4.0)
MCH: 30.2 pg (ref 26.0–34.0)
MCHC: 33.2 g/dL (ref 30.0–36.0)
MCV: 91.2 fL (ref 80.0–100.0)
Monocytes Absolute: 0.4 10*3/uL (ref 0.1–1.0)
Monocytes Relative: 7 %
Neutro Abs: 3.6 10*3/uL (ref 1.7–7.7)
Neutrophils Relative %: 62 %
Platelet Count: 165 10*3/uL (ref 150–400)
RBC: 4.86 MIL/uL (ref 4.22–5.81)
RDW: 14 % (ref 11.5–15.5)
WBC Count: 5.9 10*3/uL (ref 4.0–10.5)
nRBC: 0 % (ref 0.0–0.2)

## 2023-09-01 LAB — MAGNESIUM: Magnesium: 1.9 mg/dL (ref 1.7–2.4)

## 2023-09-06 LAB — BCR-ABL1 FISH
Cells Analyzed: 200
Cells Counted: 200

## 2023-09-08 ENCOUNTER — Inpatient Hospital Stay (HOSPITAL_BASED_OUTPATIENT_CLINIC_OR_DEPARTMENT_OTHER): Payer: 59 | Admitting: Internal Medicine

## 2023-09-08 ENCOUNTER — Encounter: Payer: Self-pay | Admitting: Internal Medicine

## 2023-09-08 VITALS — BP 140/78 | HR 69 | Temp 98.1°F | Ht 68.0 in | Wt 163.8 lb

## 2023-09-08 DIAGNOSIS — C9210 Chronic myeloid leukemia, BCR/ABL-positive, not having achieved remission: Secondary | ICD-10-CM

## 2023-09-08 NOTE — Progress Notes (Signed)
Had some nausea yesterday.

## 2023-09-08 NOTE — Progress Notes (Signed)
[facility] CONSULT NOTE  Patient Care Team: System, Provider Not In as PCP - General Earna Coder, MD as Consulting Physician (Oncology)  CHIEF COMPLAINTS/PURPOSE OF CONSULTATION: CML  #  Oncology History Overview Note  Result Comment:   Comment: (NOTE)  ABNORMAL: 89% OF NUCLEI POSITIVE FOR BCR/ABL1 GENE FUSION  SIGNAL(S)     # MARCH 2022--  Hypercellular bone marrow with involvement by chronic myeloid  leukemia, BCR-ABL1-positive . he bone marrow is hypercellular for age with myeloid and megakaryocytic  hyperplasia.  There is no increase in blasts by manual differential count of aspirate smears (<1%) or by CD34 immunohistochemistry on the core biopsy.  A reticulin special stain reveals no significant increase  in reticulin fibrosis (MF grade 0 of 3).  # April 1st week -DASATINIB 100 mg; July 2022-FISH BCR ABL-negative for 9:22 abnormality [optimal; reached 30-month milestone. MMR at 10 months [gaol at 12];   # FEB 2024-secondary to chronic nausea-decrease dose of dasatinib to 70 mg/day.   # Discontinue dasatinib because of bilateral pleural effusions; currently on asciminib 40 mg BID [since Aug 2024]-    #hypertension /alcoholic liver/ type 2 diabetes on insulin poorly controlled chronic back pain    Chronic myeloid leukemia (HCC)  01/31/2021 Initial Diagnosis   Chronic myeloid leukemia (HCC)    HISTORY OF PRESENTING ILLNESS: Ambulating independently.  Accompanied by his wife.  Christella Scheuermann 62 y.o.  male with-mild end of the problems-CKD; insulin-dependent diabetes; chronic phase CML most recently on Asciminib is here for follow-up.  Patient has intermittent nausea.  Otherwise continues to have mild diarrhea 1-2 times per day, uses Imodium.  Chronic mild shortness of breath otherwise denies any worsening cough or fevers or chills.  Review of Systems  Constitutional:  Positive for malaise/fatigue. Negative for chills, diaphoresis, fever and weight loss.   HENT:  Negative for nosebleeds and sore throat.   Eyes:  Negative for double vision.  Respiratory:  Negative for cough, hemoptysis, sputum production, shortness of breath and wheezing.   Cardiovascular:  Negative for chest pain, palpitations, orthopnea and leg swelling.  Gastrointestinal:  Negative for abdominal pain, blood in stool, constipation, diarrhea, heartburn, melena, nausea and vomiting.  Genitourinary:  Negative for dysuria, frequency and urgency.  Musculoskeletal:  Positive for back pain and joint pain.  Skin: Negative.  Negative for itching and rash.  Neurological:  Negative for dizziness, tingling, focal weakness, weakness and headaches.  Endo/Heme/Allergies:  Does not bruise/bleed easily.  Psychiatric/Behavioral:  Negative for depression. The patient is not nervous/anxious and does not have insomnia.      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 No pertinent surgical history.  SOCIAL HISTORY: Social History   Socioeconomic History   Marital status: Single    Spouse name: Not on file   Number of children: Not on file   Years of education: Not on file   Highest education level: Not on file  Occupational History   Occupation: retired     Comment: custodian   Tobacco Use  Smoking status: Some Days    Types: Cigarettes   Smokeless tobacco: Never   Tobacco comments:    4 cigarettes yesterday   Vaping Use   Vaping status: Never Used  Substance and Sexual Activity   Alcohol use: Yes    Comment: hx of ETOH abuse   Drug use: Yes    Types: Cocaine, Marijuana    Comment: had cocaine Sat.  (2 days ago)   Sexual activity: Not on file  Other Topics Concern   Not on file  Social History Narrative   Lives with S.O. [name]    Social Determinants of Health   Financial Resource Strain: Not on file  Food Insecurity: No Food Insecurity (06/20/2023)   Hunger Vital Sign    Worried About Running Out of Food in the Last Year: Never true    Ran Out of Food in the Last Year: Never true  Transportation Needs: No Transportation Needs (06/20/2023)   PRAPARE - Administrator, Civil Service (Medical): No    Lack of Transportation (Non-Medical): No  Physical Activity: Not on file  Stress: Not on file  Social Connections: Not on file  Intimate Partner Violence: Not At Risk (06/20/2023)   Humiliation, Afraid, Rape, and Kick questionnaire    Fear of Current or Ex-Partner: No    Emotionally Abused: No    Physically Abused: No    Sexually Abused: No    FAMILY HISTORY: Family History  Problem Relation Age of Onset   Diabetes Mother    Hypertension Mother    Heart failure Mother    Hyperlipidemia Mother    Thyroid disease Mother    Alcohol abuse Father    Emphysema Father    Kidney failure Father    Gout Father    Cancer Sister    Hyperlipidemia Sister    Hypertension Sister    Rheum arthritis Sister    Fibromyalgia Sister    Thyroid disease Sister     ALLERGIES:  has No Known Allergies.  MEDICATIONS:  Current Outpatient Medications  Medication Sig Dispense Refill   amLODipine (NORVASC) 10 MG tablet Take 0.5 tablets by mouth daily.     asciminib hcl (SCEMBLIX) 40 MG tablet Take 2 tablets (80 mg total) by mouth daily. Take on empty stomach, at least one hour before or two hours after food. 60 tablet 0   aspirin 81 MG chewable tablet Chew 81 mg by mouth daily.     Azelastine HCl 137 MCG/SPRAY SOLN Place 1 spray into the nose 2 (two) times daily as needed.     Brinzolamide-Brimonidine 1-0.2 % SUSP Place 1 drop into both eyes in the morning, at noon, and at  bedtime.     cetirizine (ZYRTEC) 10 MG tablet Take 10 mg by mouth at bedtime.     dorzolamide-timolol (COSOPT) 2-0.5 % ophthalmic solution Place 1 drop into both eyes 2 (two) times daily.     ergocalciferol (VITAMIN D2) 1.25 MG (50000 UT) capsule Take 1 capsule (50,000 Units total) by mouth once a week. 12 capsule 3   fluticasone (FLONASE) 50 MCG/ACT nasal spray INSTILL 1 SPRAY INTO EACH NOSTRIL EVERY DAY AS NEEDED MAXIMUM 2 SPRAYS IN EACH NOSTRIL DAILY. FOR ALLERGIES     insulin aspart (NOVOLOG) 100 UNIT/ML injection Inject 100 Units into the skin continuous. INJECT 100 UNITS UNDER SKIN CONTINUOUS-VIA-PUMP AS DIRECTED FOR DIABETES.DISCARD BOTTLE 28 DAYS AFTER OPENING     insulin glargine (LANTUS) 100 UNIT/ML injection 12 Units at bedtime.  latanoprost (XALATAN) 0.005 % ophthalmic solution Place 1 drop into both eyes at bedtime.     lipase/protease/amylase (CREON) 36000 UNITS CPEP capsule See admin instructions. TAKE 2 CAPSULES BY MOUTH WITH ACTIVE MEALS AND TAKE 1 CAPSULE WITH SNACK     lisinopril-hydrochlorothiazide (ZESTORETIC) 20-12.5 MG tablet Take 1 tablet by mouth daily.     methocarbamol (ROBAXIN) 500 MG tablet Take 1 tablet (500 mg total) by mouth every 8 (eight) hours as needed for muscle spasms. 90 tablet 1   ondansetron (ZOFRAN) 8 MG tablet TAKE ONE TABLET BY MOUTH 30-45 MINS PRIOR TO TAKING YOUR CHEMO TO PREVENT NAUSEA 30 tablet 3   pregabalin (LYRICA) 50 MG capsule Take 1 capsule (50 mg total) by mouth 3 (three) times daily. 90 capsule 1   prochlorperazine (COMPAZINE) 10 MG tablet TAKE 1 TABLET BY MOUTH 30-45 MINUTES PRIOR TO TAKING CANCER MEDICATION 60 tablet 3   sodium chloride (OCEAN) 0.65 % nasal spray SPRAY 2 SPRAYS INTO EACH NOSTRIL FOUR TIMES A DAY FOR SINUS CONGESTION     nicotine (NICODERM CQ - DOSED IN MG/24 HOURS) 14 mg/24hr patch Place 14 mg onto the skin daily. APPLY ONE PATCH TO SKIN EVERY DAY ACTIVE APPLY ONE PATCH WHEN YOU WAKE UP AND REMOVE THE OLD PATCH. PEEL THE  BACK OFF THE PATCH AND PUT IT ON CLEAN,DRY, HAIR-FREE SKIN ON THE UPPER ARM, CHEST OR BACK (Patient not taking: Reported on 04/28/2023)     No current facility-administered medications for this visit.    PHYSICAL EXAMINATION: ECOG PERFORMANCE STATUS: 0 - Asymptomatic  Vitals:   09/08/23 1333 09/08/23 1350  BP: (!) 149/78 (!) 140/78  Pulse: 69   Temp: 98.1 F (36.7 C)   SpO2: 100%      Filed Weights   09/08/23 1333  Weight: 163 lb 12.8 oz (74.3 kg)      Physical Exam Constitutional:      Comments: Patient ambulating independently.  He is accompanied by wife/significant other.  HENT:     Head: Normocephalic and atraumatic.     Mouth/Throat:     Pharynx: No oropharyngeal exudate.  Eyes:     Pupils: Pupils are equal, round, and reactive to light.  Cardiovascular:     Rate and Rhythm: Normal rate and regular rhythm.  Pulmonary:     Effort: No respiratory distress.     Breath sounds: No wheezing.     Comments: Decreased air entry bilaterally. Abdominal:     General: Bowel sounds are normal. There is no distension.     Palpations: Abdomen is soft. There is no mass.     Tenderness: There is no abdominal tenderness. There is no guarding or rebound.  Musculoskeletal:        General: No tenderness. Normal range of motion.     Cervical back: Normal range of motion and neck supple.  Skin:    General: Skin is warm.  Neurological:     Mental Status: He is alert and oriented to person, place, and time.  Psychiatric:        Mood and Affect: Affect normal.    LABORATORY DATA:  I have reviewed the data as listed Lab Results  Component Value Date   WBC 5.9 09/01/2023   HGB 14.7 09/01/2023   HCT 44.3 09/01/2023   MCV 91.2 09/01/2023   PLT 165 09/01/2023   Recent Labs    06/20/23 0942 06/21/23 0730 07/07/23 1025 07/21/23 1016 09/01/23 1021  NA 135   < > 136 130* 132*  K 3.6   < > 3.7 3.9 4.1  CL 106   < > 107 105 104  CO2 21*   < > 20* 19* 22  GLUCOSE 235*   < >  269* 351* 277*  BUN 17   < > 25* 26* 14  CREATININE 0.94   < > 1.14 1.28* 1.06  CALCIUM 8.9   < > 9.1 8.5* 8.5*  GFRNONAA >60   < > >60 >60 >60  PROT 7.6  --  7.4 7.4 7.4  ALBUMIN 3.9  --  3.8 3.7 3.7  AST 21  --  22 20 20   ALT 21  --  20 15 12   ALKPHOS 62  --  60 62 70  BILITOT 0.4  --  0.6 0.4 0.5  BILIDIR <0.1  --   --   --   --   IBILI NOT CALCULATED  --   --   --   --    < > = values in this interval not displayed.    RADIOGRAPHIC STUDIES: I have personally reviewed the radiological images as listed and agreed with the findings in the report. No results found.  ASSESSMENT & PLAN:   Chronic myeloid leukemia (HCC) #Chronic myeloid leukemia- currently on asciminib 40 mg BID [since Aug 2024; dasatinib- 70 mg- pleural effusion]-   Tolerating well-except for nausea see below. OCT 2024- 2024- NEGATIVE for the BCR-ABL1 e1a2 (p190), e13a2 (b2a2, p210) and e14a2 (b3a2, p210) fusion transcripts  FISH bcr-abl- negative. Continue Asciminib.   # Bilateral pleural effusion-s/p  postthoracentesis.  [Lymphocytic]-  Suspect to be from Dasatinib- resolved; monitor closely.  # Intermittent Nausea- sec to Dasatinib vs others [gastroparesis]-  zofran not helping; continue compazine. Continue evaluation with GI -[facility] evaluation.   #Mild anemia/CKD--hemoglobin 12.5 - saturation 10%; continued dietary [liver]- stable.   #Reflux-ron PPIs- stable.   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   #Diabetes-poorly controlled; currently on insulin pump- BG- 270  [VA]- stable  # CKD- stage III- GFR 57- stable.   Monday- pref/wife off work  # DISPOSITION:  # follow up in 3 months -MD; PRIOR 2 weeks prior-cbc/cmp; mag; BCL-ABL PCR; - Dr.B     All questions were answered. The patient knows to call the clinic with any problems, questions or concerns.     Earna Coder, MD 09/08/2023 2:03 PM

## 2023-09-08 NOTE — Assessment & Plan Note (Addendum)
#  Chronic myeloid leukemia- currently on asciminib 40 mg BID [since Aug 2024; dasatinib- 70 mg- pleural effusion]-   Tolerating well-except for nausea see below. OCT 2024- 2024- NEGATIVE for the BCR-ABL1 e1a2 (p190), e13a2 (b2a2, p210) and e14a2 (b3a2, p210) fusion transcripts  FISH bcr-abl- negative. Continue Asciminib.   # Bilateral pleural effusion-s/p  postthoracentesis.  [Lymphocytic]-  Suspect to be from Dasatinib- resolved; monitor closely.  # Intermittent Nausea- sec to Dasatinib vs others [gastroparesis]-  zofran not helping; continue compazine. Continue evaluation with GI -[facility] evaluation.   #Mild anemia/CKD--hemoglobin 12.5 - saturation 10%; continued dietary [liver]- stable.   #Reflux-ron PPIs- stable.   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   #Diabetes-poorly controlled; currently on insulin pump- BG- 270  [VA]- stable  # CKD- stage III- GFR 57- stable.   Monday- pref/wife off work  # DISPOSITION:  # follow up in 3 months -MD; PRIOR 2 weeks prior-cbc/cmp; mag; BCL-ABL PCR; - Dr.B

## 2023-09-09 LAB — BCR-ABL1, CML/ALL, PCR, QUANT
E1A2 Transcript: <0.0032 %
Interpretation (BCRAL):: NEGATIVE
b2a2 transcript: <0.0032 %
b3a2 transcript: <0.0032 %

## 2023-11-05 ENCOUNTER — Other Ambulatory Visit: Payer: Self-pay | Admitting: *Deleted

## 2023-11-05 DIAGNOSIS — C9210 Chronic myeloid leukemia, BCR/ABL-positive, not having achieved remission: Secondary | ICD-10-CM

## 2023-11-05 MED ORDER — ASCIMINIB HCL 40 MG PO TABS
80.0000 mg | ORAL_TABLET | Freq: Every day | ORAL | 0 refills | Status: DC
Start: 2023-11-05 — End: 2023-12-18

## 2023-11-05 NOTE — Telephone Encounter (Signed)
CBC with Differential (Cancer Center Only) Order: [id number] Status: Final result     Visible to patient: No (inaccessible in MyChart)     Next appt: 11/24/2023 at 10:00 AM in Oncology ([facility])     Dx: Chronic myeloid leukemia (HCC)   0 Result Notes          Component Ref Range & Units 2 mo ago (09/01/23) 3 mo ago (07/21/23) 4 mo ago (07/07/23) 4 mo ago (06/21/23) 4 mo ago (06/20/23) 6 mo ago (04/28/23) 10 mo ago (12/25/22)  WBC Count 4.0 - 10.5 K/uL 5.9 4.8 5.6 6.5 8.0 7.4 6.8 CM  RBC 4.22 - 5.81 MIL/uL 4.86 4.50 4.48 4.72 4.56 4.76 4.78  Hemoglobin 13.0 - 17.0 g/dL 40.9 81.1 91.4 78.2 95.6 14.7 14.7  HCT 39.0 - 52.0 % 44.3 40.4 41.4 42.4 41.4 43.3 44.0  MCV 80.0 - 100.0 fL 91.2 89.8 92.4 89.8 90.8 91.0 92.1  MCH 26.0 - 34.0 pg 30.2 30.4 30.6 30.7 30.7 30.9 30.8  MCHC 30.0 - 36.0 g/dL 21.3 08.6 57.8 46.9 62.9 33.9 33.4  RDW 11.5 - 15.5 % 14.0 12.9 13.3 13.5 13.8 13.6 13.2  Platelet Count 150 - 400 K/uL 165 162 165 156 164 197 CM 188  nRBC 0.0 - 0.2 % 0.0 0.0 0.0 0.0 CM 0.0 0.0 0.0  Neutrophils Relative % % 62 62 64  74 69 67  Neutro Abs 1.7 - 7.7 K/uL 3.6 3.0 3.6  5.9 5.2 4.5  Lymphocytes Relative % 25 22 19  14 16 17   Lymphs Abs 0.7 - 4.0 K/uL 1.4 1.1 1.1  1.1 1.2 1.2  Monocytes Relative % 7 8 10  6 8 9   Monocytes Absolute 0.1 - 1.0 K/uL 0.4 0.4 0.6  0.5 0.6 0.6  Eosinophils Relative % 5 8 6  5 6 7   Eosinophils Absolute 0.0 - 0.5 K/uL 0.3 0.4 0.3  0.4 0.4 0.5  Basophils Relative % 1 0 1  1 1  0  Basophils Absolute 0.0 - 0.1 K/uL 0.1 0.0 0.0  0.0 0.1 0.0  Immature Granulocytes % 0 0 0  0 0 0  Abs Immature Granulocytes 0.00 - 0.07 K/uL 0.02 0.01 CM 0.01 CM  0.03 CM 0.03 CM 0.02 CM  Comment: Performed at [facility], [address] [place], [place] [address]  WBC Morphology       MORPHOLOGY UNREMARKABLE  RBC Morphology       MORPHOLOGY UNREMARKABLE  Smear Review       MORPHOLOGY UNREMARKABLE  Resulting Agency [facility]  [facility]         Specimen Collected: 09/01/23 10:21 Last Resulted: 09/01/23 10:47    CMP (Cancer Center only) Order: [id number] Status: Final result     Visible to patient: No (inaccessible in MyChart)     Next appt: 11/24/2023 at 10:00 AM in Oncology ([facility])     Dx: Chronic myeloid leukemia (HCC)   0 Result Notes     1 HM Topic          Component Ref Range & Units 2 mo ago (09/01/23) 3 mo ago (07/21/23) 4 mo ago (07/07/23) 4 mo ago (06/21/23) 4 mo ago (06/20/23) 4 mo ago (06/20/23) 6 mo ago (04/28/23)  Sodium 135 - 145 mmol/L 132 Low  130 Low  136 132 Low  135  131 Low   Potassium 3.5 - 5.1 mmol/L 4.1  3.9 3.7 3.7 3.6  4.7  Chloride 98 - 111 mmol/L 104 105 107 101 106  103  CO2 22 - 32 mmol/L 22 19 Low  20 Low  20 Low  21 Low   22  Glucose, Bld 70 - 99 mg/dL 811 High  914 High  CM 269 High  CM 198 High  CM 235 High  CM  277 High  CM  Comment: Glucose reference range applies only to samples taken after fasting for at least 8 hours.  BUN 8 - 23 mg/dL 14 26 High  25 High  18 17  34 High   Creatinine 0.61 - 1.24 mg/dL 7.82 9.56 High  2.13 0.86 0.94  1.41 High   Calcium 8.9 - 10.3 mg/dL 8.5 Low  8.5 Low  9.1 9.1 8.9  9.4  Total Protein 6.5 - 8.1 g/dL 7.4 7.4 7.4   7.6 8.0  Albumin 3.5 - 5.0 g/dL 3.7 3.7 3.8   3.9 4.1  AST 15 - 41 U/L 20 20 22   21 21   ALT 0 - 44 U/L 12 15 20   21 19   Alkaline Phosphatase 38 - 126 U/L 70 62 60   62 71  Total Bilirubin 0.3 - 1.2 mg/dL 0.5 0.4 0.6   0.4 0.5  GFR, Estimated >60 mL/min >60 >60 CM >60 CM    56 Low  CM  Comment: (NOTE) Calculated using the CKD-EPI Creatinine Equation (2021)  Anion gap 5 - 15 6 6  CM 9 CM 11 CM 8 CM  6 CM  Comment: Performed at [facility], [address] [place], [place] [address]  Resulting Agency [facility]         Specimen Collected: 09/01/23 10:21 Last Resulted: 09/01/23  10:49

## 2023-11-24 ENCOUNTER — Other Ambulatory Visit: Payer: Self-pay

## 2023-11-24 ENCOUNTER — Inpatient Hospital Stay: Payer: 59 | Attending: Internal Medicine

## 2023-11-24 DIAGNOSIS — C9210 Chronic myeloid leukemia, BCR/ABL-positive, not having achieved remission: Secondary | ICD-10-CM | POA: Insufficient documentation

## 2023-11-24 LAB — CBC WITH DIFFERENTIAL (CANCER CENTER ONLY)
Abs Immature Granulocytes: 0.03 10*3/uL (ref 0.00–0.07)
Basophils Absolute: 0.1 10*3/uL (ref 0.0–0.1)
Basophils Relative: 1 %
Eosinophils Absolute: 0.4 10*3/uL (ref 0.0–0.5)
Eosinophils Relative: 6 %
HCT: 45 % (ref 39.0–52.0)
Hemoglobin: 14.8 g/dL (ref 13.0–17.0)
Immature Granulocytes: 1 %
Lymphocytes Relative: 23 %
Lymphs Abs: 1.5 10*3/uL (ref 0.7–4.0)
MCH: 29.5 pg (ref 26.0–34.0)
MCHC: 32.9 g/dL (ref 30.0–36.0)
MCV: 89.6 fL (ref 80.0–100.0)
Monocytes Absolute: 0.4 10*3/uL (ref 0.1–1.0)
Monocytes Relative: 6 %
Neutro Abs: 4.1 10*3/uL (ref 1.7–7.7)
Neutrophils Relative %: 63 %
Platelet Count: 172 10*3/uL (ref 150–400)
RBC: 5.02 MIL/uL (ref 4.22–5.81)
RDW: 13.3 % (ref 11.5–15.5)
Smear Review: NORMAL
WBC Count: 6.4 10*3/uL (ref 4.0–10.5)
nRBC: 0 % (ref 0.0–0.2)

## 2023-11-24 LAB — CMP (CANCER CENTER ONLY)
ALT: 16 U/L (ref 0–44)
AST: 18 U/L (ref 15–41)
Albumin: 3.8 g/dL (ref 3.5–5.0)
Alkaline Phosphatase: 69 U/L (ref 38–126)
Anion gap: 10 (ref 5–15)
BUN: 15 mg/dL (ref 8–23)
CO2: 23 mmol/L (ref 22–32)
Calcium: 9 mg/dL (ref 8.9–10.3)
Chloride: 104 mmol/L (ref 98–111)
Creatinine: 1.16 mg/dL (ref 0.61–1.24)
GFR, Estimated: >60 mL/min (ref >60)
Glucose, Bld: 216 mg/dL — ABNORMAL HIGH (ref 70–99)
Potassium: 4.3 mmol/L (ref 3.5–5.1)
Sodium: 137 mmol/L (ref 135–145)
Total Bilirubin: 0.6 mg/dL (ref 0.0–1.2)
Total Protein: 7.4 g/dL (ref 6.5–8.1)

## 2023-11-24 LAB — MAGNESIUM: Magnesium: 1.9 mg/dL (ref 1.7–2.4)

## 2023-11-29 LAB — BCR-ABL1, CML/ALL, PCR, QUANT
E1A2 Transcript: <0.0032 %
Interpretation (BCRAL):: NEGATIVE
b2a2 transcript: <0.0032 %
b3a2 transcript: <0.0032 %

## 2023-12-01 ENCOUNTER — Telehealth: Payer: Self-pay | Admitting: Pharmacist

## 2023-12-01 ENCOUNTER — Telehealth: Payer: Self-pay

## 2023-12-01 NOTE — Telephone Encounter (Signed)
 approved on [id number] from 2023-11-26 to 2024-11-24.

## 2023-12-01 NOTE — Telephone Encounter (Signed)
 Clinical Pharmacist Practitioner Encounter   Re-authorization  Prior Authorization for Scemblix has been approved.     PA#  [id number] Effective dates: 11/26/23 through 11/24/24  CMM key: [id number]    Renaee GEANNIE Ditch, PharmD, BCPS. [facility] Hematology/Oncology Clinical Pharmacist [facility] [phone]  12/01/2023 12:46 PM

## 2023-12-01 NOTE — Telephone Encounter (Signed)
 Prior auth submitted thru covermymeds for : (Key: [id number]) Scemblix 40MG  tablets

## 2023-12-05 ENCOUNTER — Other Ambulatory Visit: Payer: Self-pay

## 2023-12-05 DIAGNOSIS — C9210 Chronic myeloid leukemia, BCR/ABL-positive, not having achieved remission: Secondary | ICD-10-CM

## 2023-12-08 ENCOUNTER — Encounter: Payer: Self-pay | Admitting: Internal Medicine

## 2023-12-08 ENCOUNTER — Inpatient Hospital Stay: Payer: 59 | Attending: Internal Medicine | Admitting: Internal Medicine

## 2023-12-08 DIAGNOSIS — Z7982 Long term (current) use of aspirin: Secondary | ICD-10-CM | POA: Diagnosis not present

## 2023-12-08 DIAGNOSIS — Z79899 Other long term (current) drug therapy: Secondary | ICD-10-CM | POA: Insufficient documentation

## 2023-12-08 DIAGNOSIS — C9210 Chronic myeloid leukemia, BCR/ABL-positive, not having achieved remission: Secondary | ICD-10-CM | POA: Diagnosis not present

## 2023-12-08 DIAGNOSIS — F17210 Nicotine dependence, cigarettes, uncomplicated: Secondary | ICD-10-CM | POA: Insufficient documentation

## 2023-12-08 NOTE — Assessment & Plan Note (Addendum)
#  Chronic myeloid leukemia- currently on asciminib 40 mg BID [since Aug 2024; dasatinib - 70 mg- pleural effusion]-     DEC 30th, 2024- NEGATIVE for the BCR-ABL1 e1a2 (p190), e13a2 (b2a2, p210) and e14a2 (b3a2, p210) fusion transcripts  FISH bcr-abl- negative. Continue Asciminib.   # Black colored stool- ? Dietary vs other- recommend close monitoring if worsening recommend evaluation with GI.   # Intermittent Nausea- sec to Dasatinib  vs others [gastroparesis]-  zofran  not helping; continue compazine . Continue evaluation with GI -[facility] evaluation- stable.   #Mild anemia/CKD--hemoglobin 12.5 - saturation 10%; continued dietary [liver]- stable.   #Reflux-ron PPIs-  stable.  #Diabetes-poorly controlled; currently on insulin  pump- BG- 270  [VA]-stable.   # CKD- stage III- GFR 57- stable.   Monday- pref/wife off work  # DISPOSITION:  # follow up in 3 months -MD; PRIOR 2 weeks prior-cbc/cmp; mag; BCL-ABL PCR; - Dr.B

## 2023-12-08 NOTE — Progress Notes (Signed)
 [facility] CONSULT NOTE  Patient Care Team: System, Provider Not In as PCP - General Rennie Cindy SAUNDERS, MD as Consulting Physician (Oncology)  CHIEF COMPLAINTS/PURPOSE OF CONSULTATION: CML  #  Oncology History Overview Note  Result Comment:   Comment: (NOTE)  ABNORMAL: 89% OF NUCLEI POSITIVE FOR BCR/ABL1 GENE FUSION  SIGNAL(S)     # MARCH 2022--  Hypercellular bone marrow with involvement by chronic myeloid  leukemia, BCR-ABL1-positive . he bone marrow is hypercellular for age with myeloid and megakaryocytic  hyperplasia.  There is no increase in blasts by manual differential count of aspirate smears (<1%) or by CD34 immunohistochemistry on the core biopsy.  A reticulin special stain reveals no significant increase  in reticulin fibrosis (MF grade 0 of 3).  # April 1st week -DASATINIB  100 mg; July 2022-FISH BCR ABL-negative for 9:22 abnormality [optimal; reached 4-month milestone. MMR at 10 months [gaol at 12];   # FEB 2024-secondary to chronic nausea-decrease dose of dasatinib  to 70 mg/day.   # Discontinue dasatinib  because of bilateral pleural effusions; currently on asciminib 40 mg BID [since Aug 2024]-    #hypertension /alcoholic liver/ type 2 diabetes on insulin  poorly controlled chronic back pain    Chronic myeloid leukemia (HCC)  01/31/2021 Initial Diagnosis   Chronic myeloid leukemia (HCC)    HISTORY OF PRESENTING ILLNESS: Ambulating independently.  Accompanied by his wife.  Frank [name] 63 y.o.  male with-mild end of the problems-CKD; insulin -dependent diabetes; chronic phase CML most recently on Asciminib is here for follow-up.  Patient complains of bilateral -leg pain 8/10, sciatica.this is chronic not any worse.    [facility] [given the time of reaction with Asciminib].  Patient notes to have  stool changed color to black, started this morning. No pain associated with this.   Patient has intermittent nausea.   Otherwise continues to have mild diarrhea 1-2 times per day, uses Imodium.  Chronic mild shortness of breath otherwise denies any worsening cough or fevers or chills.  Review of Systems  Constitutional:  Positive for malaise/fatigue. Negative for chills, diaphoresis, fever and weight loss.  HENT:  Negative for nosebleeds and sore throat.   Eyes:  Negative for double vision.  Respiratory:  Negative for cough, hemoptysis, sputum production, shortness of breath and wheezing.   Cardiovascular:  Negative for chest pain, palpitations, orthopnea and leg swelling.  Gastrointestinal:  Negative for abdominal pain, blood in stool, constipation, diarrhea, heartburn, melena, nausea and vomiting.  Genitourinary:  Negative for dysuria, frequency and urgency.  Musculoskeletal:  Positive for back pain and joint pain.  Skin: Negative.  Negative for itching and rash.  Neurological:  Negative for dizziness, tingling, focal weakness, weakness and headaches.  Endo/Heme/Allergies:  Does not bruise/bleed easily.  Psychiatric/Behavioral:  Negative for depression. The patient is not nervous/anxious and does not have insomnia.      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 No pertinent surgical history.  SOCIAL HISTORY: Social History   Socioeconomic History   Marital status: Single  Spouse name: Not on file   Number of children: Not on file   Years of education: Not on file   Highest education level: Not on file  Occupational History   Occupation: retired     Comment: custodian   Tobacco Use   Smoking status: Some Days     Types: Cigarettes   Smokeless tobacco: Never   Tobacco comments:    4 cigarettes yesterday   Vaping Use   Vaping status: Never Used  Substance and Sexual Activity   Alcohol use: Yes    Comment: hx of ETOH abuse   Drug use: Yes    Types: Cocaine, Marijuana    Comment: had cocaine Sat. (2 days ago)   Sexual activity: Not on file  Other Topics Concern   Not on file  Social History Narrative   Lives with S.O. Montie Ill    Social Drivers of Health   Financial Resource Strain: Not on file  Food Insecurity: No Food Insecurity (06/20/2023)   Hunger Vital Sign    Worried About Running Out of Food in the Last Year: Never true    Ran Out of Food in the Last Year: Never true  Transportation Needs: No Transportation Needs (06/20/2023)   PRAPARE - Administrator, Civil Service (Medical): No    Lack of Transportation (Non-Medical): No  Physical Activity: Not on file  Stress: Not on file  Social Connections: Not on file  Intimate Partner Violence: Not At Risk (06/20/2023)   Humiliation, Afraid, Rape, and Kick questionnaire    Fear of Current or Ex-Partner: No    Emotionally Abused: No    Physically Abused: No    Sexually Abused: No    FAMILY HISTORY: Family History  Problem Relation Age of Onset   Diabetes Mother    Hypertension Mother    Heart failure Mother    Hyperlipidemia Mother    Thyroid disease Mother    Alcohol abuse Father    Emphysema Father    Kidney failure Father    Gout Father    Cancer Sister    Hyperlipidemia Sister    Hypertension Sister    Rheum arthritis Sister    Fibromyalgia Sister    Thyroid disease Sister     ALLERGIES:  has no known allergies.  MEDICATIONS:  Current Outpatient Medications  Medication Sig Dispense Refill   lovastatin (MEVACOR) 40 MG tablet Take 40 mg by mouth at bedtime.     amLODipine  (NORVASC ) 10 MG tablet Take 0.5 tablets by mouth daily.     asciminib hcl (SCEMBLIX) 40 MG tablet Take 2 tablets (80 mg total) by  mouth daily. Take on empty stomach, at least one hour before or two hours after food. 60 tablet 0   aspirin 81 MG chewable tablet Chew 81 mg by mouth daily.     Azelastine HCl 137 MCG/SPRAY SOLN Place 1 spray into the nose 2 (two) times daily as needed.     Brinzolamide-Brimonidine 1-0.2 % SUSP Place 1 drop into both eyes in the morning, at noon, and at bedtime.     cetirizine (ZYRTEC) 10 MG tablet Take 10 mg by mouth at bedtime.     dorzolamide -timolol  (COSOPT ) 2-0.5 % ophthalmic solution Place 1 drop into both eyes 2 (two) times daily.     ergocalciferol  (VITAMIN D2) 1.25 MG (50000 UT) capsule Take 1 capsule (50,000 Units total) by mouth once a week. 12 capsule 3   fluticasone  (FLONASE ) 50 MCG/ACT nasal spray INSTILL 1  SPRAY INTO EACH NOSTRIL EVERY DAY AS NEEDED MAXIMUM 2 SPRAYS IN EACH NOSTRIL DAILY. FOR ALLERGIES     insulin  aspart (NOVOLOG ) 100 UNIT/ML injection Inject 100 Units into the skin continuous. INJECT 100 UNITS UNDER SKIN CONTINUOUS-VIA-PUMP AS DIRECTED FOR DIABETES.DISCARD BOTTLE 28 DAYS AFTER OPENING     insulin  glargine (LANTUS ) 100 UNIT/ML injection 12 Units at bedtime.     latanoprost  (XALATAN ) 0.005 % ophthalmic solution Place 1 drop into both eyes at bedtime.     lipase/protease/amylase (CREON) 36000 UNITS CPEP capsule See admin instructions. TAKE 2 CAPSULES BY MOUTH WITH ACTIVE MEALS AND TAKE 1 CAPSULE WITH SNACK     lisinopril -hydrochlorothiazide  (ZESTORETIC ) 20-12.5 MG tablet Take 1 tablet by mouth daily.     methocarbamol  (ROBAXIN ) 500 MG tablet Take 1 tablet (500 mg total) by mouth every 8 (eight) hours as needed for muscle spasms. 90 tablet 1   nicotine  (NICODERM CQ  - DOSED IN MG/24 HOURS) 14 mg/24hr patch Place 14 mg onto the skin daily. APPLY ONE PATCH TO SKIN EVERY DAY ACTIVE APPLY ONE PATCH WHEN YOU WAKE UP AND REMOVE THE OLD PATCH. PEEL THE BACK OFF THE PATCH AND PUT IT ON CLEAN,DRY, HAIR-FREE SKIN ON THE UPPER ARM, CHEST OR BACK (Patient not taking: Reported on  04/28/2023)     ondansetron  (ZOFRAN ) 8 MG tablet TAKE ONE TABLET BY MOUTH 30-45 MINS PRIOR TO TAKING YOUR CHEMO TO PREVENT NAUSEA 30 tablet 3   pregabalin  (LYRICA ) 50 MG capsule Take 1 capsule (50 mg total) by mouth 3 (three) times daily. 90 capsule 1   prochlorperazine  (COMPAZINE ) 10 MG tablet TAKE 1 TABLET BY MOUTH 30-45 MINUTES PRIOR TO TAKING CANCER MEDICATION 60 tablet 3   sodium chloride  (OCEAN) 0.65 % nasal spray SPRAY 2 SPRAYS INTO EACH NOSTRIL FOUR TIMES A DAY FOR SINUS CONGESTION     No current facility-administered medications for this visit.    PHYSICAL EXAMINATION: ECOG PERFORMANCE STATUS: 0 - Asymptomatic  Vitals:   12/08/23 1006  BP: 126/75  Pulse: 70  Temp: 97.7 F (36.5 C)  SpO2: 100%      Filed Weights   12/08/23 1006  Weight: 170 lb (77.1 kg)       Physical Exam Constitutional:      Comments: Patient ambulating independently.  He is accompanied by wife/significant other.  HENT:     Head: Normocephalic and atraumatic.     Mouth/Throat:     Pharynx: No oropharyngeal exudate.  Eyes:     Pupils: Pupils are equal, round, and reactive to light.  Cardiovascular:     Rate and Rhythm: Normal rate and regular rhythm.  Pulmonary:     Effort: No respiratory distress.     Breath sounds: No wheezing.     Comments: Decreased air entry bilaterally. Abdominal:     General: Bowel sounds are normal. There is no distension.     Palpations: Abdomen is soft. There is no mass.     Tenderness: There is no abdominal tenderness. There is no guarding or rebound.  Musculoskeletal:        General: No tenderness. Normal range of motion.     Cervical back: Normal range of motion and neck supple.  Skin:    General: Skin is warm.  Neurological:     Mental Status: He is alert and oriented to person, place, and time.  Psychiatric:        Mood and Affect: Affect normal.    LABORATORY DATA:  I have reviewed the data as  listed Lab Results  Component Value Date   WBC 6.4  11/24/2023   HGB 14.8 11/24/2023   HCT 45.0 11/24/2023   MCV 89.6 11/24/2023   PLT 172 11/24/2023   Recent Labs    06/20/23 0942 06/21/23 0730 07/21/23 1016 09/01/23 1021 11/24/23 1034  NA 135   < > 130* 132* 137  K 3.6   < > 3.9 4.1 4.3  CL 106   < > 105 104 104  CO2 21*   < > 19* 22 23  GLUCOSE 235*   < > 351* 277* 216*  BUN 17   < > 26* 14 15  CREATININE 0.94   < > 1.28* 1.06 1.16  CALCIUM  8.9   < > 8.5* 8.5* 9.0  GFRNONAA >60   < > >60 >60 >60  PROT 7.6   < > 7.4 7.4 7.4  ALBUMIN 3.9   < > 3.7 3.7 3.8  AST 21   < > 20 20 18   ALT 21   < > 15 12 16   ALKPHOS 62   < > 62 70 69  BILITOT 0.4   < > 0.4 0.5 0.6  BILIDIR <0.1  --   --   --   --   IBILI NOT CALCULATED  --   --   --   --    < > = values in this interval not displayed.    RADIOGRAPHIC STUDIES: I have personally reviewed the radiological images as listed and agreed with the findings in the report. No results found.  ASSESSMENT & PLAN:   Chronic myeloid leukemia (HCC) #Chronic myeloid leukemia- currently on asciminib 40 mg BID [since Aug 2024; dasatinib - 70 mg- pleural effusion]-     DEC 30th, 2024- NEGATIVE for the BCR-ABL1 e1a2 (p190), e13a2 (b2a2, p210) and e14a2 (b3a2, p210) fusion transcripts  FISH bcr-abl- negative. Continue Asciminib.   # Black colored stool- ? Dietary vs other- recommend close monitoring if worsening recommend evaluation with GI.   # Intermittent Nausea- sec to Dasatinib  vs others [gastroparesis]-  zofran  not helping; continue compazine . Continue evaluation with GI -[facility] evaluation- stable.   #Mild anemia/CKD--hemoglobin 12.5 - saturation 10%; continued dietary [liver]- stable.   #Reflux-ron PPIs-  stable.  #Diabetes-poorly controlled; currently on insulin  pump- BG- 270  [[facility]]-stable.   # CKD- stage III- GFR 57- stable.   Monday- pref/wife off work  # DISPOSITION:  # follow up in 3 months -MD; PRIOR 2 weeks prior-cbc/cmp; mag; BCL-ABL PCR; - Dr.B     All questions were  answered. The patient knows to call the clinic with any problems, questions or concerns.     Cindy JONELLE Joe, MD 12/08/2023 10:40 AM

## 2023-12-08 NOTE — Progress Notes (Signed)
 B/l leg pain 8/10, sciatic.  [facility].  Pt concerned with stool changed color to black, started this morning. No pain associated with this.

## 2023-12-18 ENCOUNTER — Other Ambulatory Visit: Payer: Self-pay | Admitting: *Deleted

## 2023-12-18 DIAGNOSIS — C9210 Chronic myeloid leukemia, BCR/ABL-positive, not having achieved remission: Secondary | ICD-10-CM

## 2023-12-18 MED ORDER — ASCIMINIB HCL 40 MG PO TABS
80.0000 mg | ORAL_TABLET | Freq: Every day | ORAL | 0 refills | Status: DC
Start: 2023-12-18 — End: 2024-02-11

## 2023-12-18 NOTE — Progress Notes (Signed)
 error

## 2023-12-18 NOTE — Progress Notes (Signed)
Patient called saying that he needs a refill and he did not know how to get it.  He says her medicine is asciminib and the refill was sent in to [facility] and the patient will have it as soon as [employer] gets it done and shipped to him.  He was thankful for that

## 2024-02-04 ENCOUNTER — Telehealth: Payer: Self-pay | Admitting: Internal Medicine

## 2024-02-04 NOTE — Telephone Encounter (Signed)
 Please call patient to discuss disposal of chemo medication.

## 2024-02-11 ENCOUNTER — Other Ambulatory Visit: Payer: Self-pay | Admitting: *Deleted

## 2024-02-11 DIAGNOSIS — C9210 Chronic myeloid leukemia, BCR/ABL-positive, not having achieved remission: Secondary | ICD-10-CM

## 2024-02-11 MED ORDER — ASCIMINIB HCL 40 MG PO TABS: 80.0000 mg | ORAL_TABLET | Freq: Every day | ORAL | 0 refills | Status: DC

## 2024-02-16 ENCOUNTER — Telehealth: Payer: Self-pay | Admitting: Internal Medicine

## 2024-02-16 NOTE — Telephone Encounter (Signed)
 Called and left patient a voicemail due to needing to change the time of appointment on 4/14 to 3pm. Stated to call if time doesn't work and we can change to day/time that does.

## 2024-02-23 ENCOUNTER — Inpatient Hospital Stay: Payer: 59 | Attending: Internal Medicine

## 2024-02-23 DIAGNOSIS — C9210 Chronic myeloid leukemia, BCR/ABL-positive, not having achieved remission: Secondary | ICD-10-CM | POA: Insufficient documentation

## 2024-02-23 LAB — CBC WITH DIFFERENTIAL (CANCER CENTER ONLY)
Abs Immature Granulocytes: 0.02 10*3/uL (ref 0.00–0.07)
Basophils Absolute: 0 10*3/uL (ref 0.0–0.1)
Basophils Relative: 1 %
Eosinophils Absolute: 0.3 10*3/uL (ref 0.0–0.5)
Eosinophils Relative: 4 %
HCT: 46.1 % (ref 39.0–52.0)
Hemoglobin: 15.5 g/dL (ref 13.0–17.0)
Immature Granulocytes: 0 %
Lymphocytes Relative: 21 %
Lymphs Abs: 1.4 10*3/uL (ref 0.7–4.0)
MCH: 29.4 pg (ref 26.0–34.0)
MCHC: 33.6 g/dL (ref 30.0–36.0)
MCV: 87.3 fL (ref 80.0–100.0)
Monocytes Absolute: 0.5 10*3/uL (ref 0.1–1.0)
Monocytes Relative: 8 %
Neutro Abs: 4.3 10*3/uL (ref 1.7–7.7)
Neutrophils Relative %: 66 %
Platelet Count: 182 10*3/uL (ref 150–400)
RBC: 5.28 MIL/uL (ref 4.22–5.81)
RDW: 13.4 % (ref 11.5–15.5)
Smear Review: NORMAL
WBC Count: 6.5 10*3/uL (ref 4.0–10.5)
nRBC: 0 % (ref 0.0–0.2)

## 2024-02-23 LAB — CMP (CANCER CENTER ONLY)
ALT: 21 U/L (ref 0–44)
AST: 20 U/L (ref 15–41)
Albumin: 3.9 g/dL (ref 3.5–5.0)
Alkaline Phosphatase: 81 U/L (ref 38–126)
Anion gap: 7 (ref 5–15)
BUN: 13 mg/dL (ref 8–23)
CO2: 24 mmol/L (ref 22–32)
Calcium: 9 mg/dL (ref 8.9–10.3)
Chloride: 101 mmol/L (ref 98–111)
Creatinine: 1.15 mg/dL (ref 0.61–1.24)
GFR, Estimated: >60 mL/min (ref >60)
Glucose, Bld: 326 mg/dL — ABNORMAL HIGH (ref 70–99)
Potassium: 4.7 mmol/L (ref 3.5–5.1)
Sodium: 132 mmol/L — ABNORMAL LOW (ref 135–145)
Total Bilirubin: 0.7 mg/dL (ref 0.0–1.2)
Total Protein: 7.3 g/dL (ref 6.5–8.1)

## 2024-02-23 LAB — MAGNESIUM: Magnesium: 2.1 mg/dL (ref 1.7–2.4)

## 2024-02-26 LAB — BCR-ABL1, CML/ALL, PCR, QUANT
E1A2 Transcript: <0.0032 %
Interpretation (BCRAL):: NEGATIVE
b2a2 transcript: <0.0032 %
b3a2 transcript: <0.0032 %

## 2024-03-08 ENCOUNTER — Inpatient Hospital Stay: Payer: 59 | Attending: Internal Medicine | Admitting: Internal Medicine

## 2024-03-08 ENCOUNTER — Encounter: Payer: Self-pay | Admitting: Internal Medicine

## 2024-03-08 ENCOUNTER — Ambulatory Visit
Admission: RE | Admit: 2024-03-08 | Discharge: 2024-03-08 | Disposition: A | Discharge status: Home / Self Care | Source: Ambulatory Visit | Attending: Internal Medicine

## 2024-03-08 VITALS — BP 143/79 | HR 68 | Temp 98.5°F | Resp 18 | Ht 68.0 in | Wt 173.4 lb

## 2024-03-08 DIAGNOSIS — E1122 Type 2 diabetes mellitus with diabetic chronic kidney disease: Secondary | ICD-10-CM | POA: Insufficient documentation

## 2024-03-08 DIAGNOSIS — R079 Chest pain, unspecified: Secondary | ICD-10-CM

## 2024-03-08 DIAGNOSIS — I129 Hypertensive chronic kidney disease with stage 1 through stage 4 chronic kidney disease, or unspecified chronic kidney disease: Secondary | ICD-10-CM | POA: Diagnosis not present

## 2024-03-08 DIAGNOSIS — C9210 Chronic myeloid leukemia, BCR/ABL-positive, not having achieved remission: Secondary | ICD-10-CM

## 2024-03-08 DIAGNOSIS — D649 Anemia, unspecified: Secondary | ICD-10-CM | POA: Diagnosis not present

## 2024-03-08 DIAGNOSIS — N183 Chronic kidney disease, stage 3 unspecified: Secondary | ICD-10-CM | POA: Insufficient documentation

## 2024-03-08 MED ORDER — ASCIMINIB HCL 40 MG PO TABS
80.0000 mg | ORAL_TABLET | Freq: Every day | ORAL | 2 refills | Status: DC
Start: 2024-03-08 — End: 2024-06-16

## 2024-03-08 MED ORDER — ASCIMINIB HCL 40 MG PO TABS: 80.0000 mg | ORAL_TABLET | Freq: Every day | ORAL | 2 refills | Status: DC

## 2024-03-08 MED ORDER — PROCHLORPERAZINE MALEATE 10 MG PO TABS: ORAL_TABLET | ORAL | 3 refills | Status: DC

## 2024-03-08 NOTE — Progress Notes (Signed)
 [facility] CONSULT NOTE  Patient Care Team: System, Provider Not In as PCP - General Gwyn Leos, MD as Consulting Physician (Oncology)  CHIEF COMPLAINTS/PURPOSE OF CONSULTATION: CML  #  Oncology History Overview Note  Result Comment:   Comment: (NOTE)  ABNORMAL: 89% OF NUCLEI POSITIVE FOR BCR/ABL1 GENE FUSION  SIGNAL(S)     # MARCH 2022--  Hypercellular bone marrow with involvement by chronic myeloid  leukemia, BCR-ABL1-positive . he bone marrow is hypercellular for age with myeloid and megakaryocytic  hyperplasia.  There is no increase in blasts by manual differential count of aspirate smears (<1%) or by CD34 immunohistochemistry on the core biopsy.  A reticulin special stain reveals no significant increase  in reticulin fibrosis (MF grade 0 of 3).  # April 1st week -DASATINIB 100 mg; July 2022-FISH BCR ABL-negative for 9:22 abnormality [optimal; reached 17-month milestone. MMR at 10 months [gaol at 12];   # FEB 2024-secondary to chronic nausea-decrease dose of dasatinib to 70 mg/day.   # Discontinue dasatinib because of bilateral pleural effusions; currently on asciminib 40 mg BID [since Aug 2024]-    #hypertension /alcoholic liver/ type 2 diabetes on insulin poorly controlled chronic back pain    Chronic myeloid leukemia (HCC)  01/31/2021 Initial Diagnosis   Chronic myeloid leukemia (HCC)    HISTORY OF PRESENTING ILLNESS: Ambulating independently.  Accompanied by his wife.  Frank [name] 63 y.o.  male with-mild end of the problems-CKD; insulin-dependent diabetes; chronic phase CML most recently on Asciminib is here for follow-up.  Patients C/o ongoing intermittent chest pains for the past few weeks. Coming and going. No precipitating factor. Chronic mild shortness of breath. Otherwise denies any worsening cough or fevers or chills.   Patient has intermittent nausea.  Otherwise continues to have mild diarrhea 1-2 times per day, uses Imodium.     Review of Systems  Constitutional:  Positive for malaise/fatigue. Negative for chills, diaphoresis, fever and weight loss.  HENT:  Negative for nosebleeds and sore throat.   Eyes:  Negative for double vision.  Respiratory:  Negative for cough, hemoptysis, sputum production, shortness of breath and wheezing.   Cardiovascular:  Negative for chest pain, palpitations, orthopnea and leg swelling.  Gastrointestinal:  Negative for abdominal pain, blood in stool, constipation, diarrhea, heartburn, melena, nausea and vomiting.  Genitourinary:  Negative for dysuria, frequency and urgency.  Musculoskeletal:  Positive for back pain and joint pain.  Skin: Negative.  Negative for itching and rash.  Neurological:  Negative for dizziness, tingling, focal weakness, weakness and headaches.  Endo/Heme/Allergies:  Does not bruise/bleed easily.  Psychiatric/Behavioral:  Negative for depression. The patient is not nervous/anxious and does not have insomnia.      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 No pertinent surgical history.  SOCIAL HISTORY: Social History   Socioeconomic History   Marital status: Single    Spouse name: Not on file   Number of children: Not on file   Years of education: Not on file   Highest education level: Not  on file  Occupational History   Occupation: retired     Comment: custodian   Tobacco Use   Smoking status: Some Days    Types: Cigarettes   Smokeless tobacco: Never   Tobacco comments:    4 cigarettes yesterday   Vaping Use   Vaping status: Never Used  Substance and Sexual Activity    Alcohol use: Yes    Comment: hx of ETOH abuse   Drug use: Yes    Types: Cocaine, Marijuana    Comment: had cocaine Sat. (2 days ago)   Sexual activity: Not on file  Other Topics Concern   Not on file  Social History Narrative   Lives with S.O. [name]    Social Drivers of Health   Financial Resource Strain: Not on file  Food Insecurity: No Food Insecurity (06/20/2023)   Hunger Vital Sign    Worried About Running Out of Food in the Last Year: Never true    Ran Out of Food in the Last Year: Never true  Transportation Needs: No Transportation Needs (06/20/2023)   PRAPARE - Administrator, Civil Service (Medical): No    Lack of Transportation (Non-Medical): No  Physical Activity: Not on file  Stress: Not on file  Social Connections: Not on file  Intimate Partner Violence: Not At Risk (06/20/2023)   Humiliation, Afraid, Rape, and Kick questionnaire    Fear of Current or Ex-Partner: No    Emotionally Abused: No    Physically Abused: No    Sexually Abused: No    FAMILY HISTORY: Family History  Problem Relation Age of Onset   Diabetes Mother    Hypertension Mother    Heart failure Mother    Hyperlipidemia Mother    Thyroid disease Mother    Alcohol abuse Father    Emphysema Father    Kidney failure Father    Gout Father    Cancer Sister    Hyperlipidemia Sister    Hypertension Sister    Rheum arthritis Sister    Fibromyalgia Sister    Thyroid disease Sister     ALLERGIES:  has no known allergies.  MEDICATIONS:  Current Outpatient Medications  Medication Sig Dispense Refill   amLODipine (NORVASC) 10 MG tablet Take 0.5 tablets by mouth daily.     aspirin 81 MG chewable tablet Chew 81 mg by mouth daily.     Azelastine HCl 137 MCG/SPRAY SOLN Place 1 spray into the nose 2 (two) times daily as needed.     cetirizine (ZYRTEC) 10 MG tablet Take 10 mg by mouth at bedtime.     dorzolamide-timolol (COSOPT) 2-0.5 % ophthalmic solution Place 1 drop into both  eyes 2 (two) times daily.     ergocalciferol (VITAMIN D2) 1.25 MG (50000 UT) capsule Take 1 capsule (50,000 Units total) by mouth once a week. 12 capsule 3   fluticasone (FLONASE) 50 MCG/ACT nasal spray INSTILL 1 SPRAY INTO EACH NOSTRIL EVERY DAY AS NEEDED MAXIMUM 2 SPRAYS IN EACH NOSTRIL DAILY. FOR ALLERGIES     insulin aspart (NOVOLOG) 100 UNIT/ML injection Inject 100 Units into the skin continuous. INJECT 100 UNITS UNDER SKIN CONTINUOUS-VIA-PUMP AS DIRECTED FOR DIABETES.DISCARD BOTTLE 28 DAYS AFTER OPENING     insulin glargine (LANTUS) 100 UNIT/ML injection 12 Units at bedtime.     latanoprost (XALATAN) 0.005 % ophthalmic solution Place 1 drop into both eyes at bedtime.     lipase/protease/amylase (CREON) 36000 UNITS CPEP capsule See admin instructions. TAKE 2 CAPSULES BY MOUTH  WITH ACTIVE MEALS AND TAKE 1 CAPSULE WITH SNACK     lisinopril-hydrochlorothiazide (ZESTORETIC) 20-12.5 MG tablet Take 1 tablet by mouth daily.     lovastatin (MEVACOR) 40 MG tablet Take 40 mg by mouth at bedtime.     methocarbamol (ROBAXIN) 500 MG tablet Take 1 tablet (500 mg total) by mouth every 8 (eight) hours as needed for muscle spasms. 90 tablet 1   nicotine (NICODERM CQ - DOSED IN MG/24 HOURS) 14 mg/24hr patch Place 14 mg onto the skin daily. APPLY ONE PATCH TO SKIN EVERY DAY ACTIVE APPLY ONE PATCH WHEN YOU WAKE UP AND REMOVE THE OLD PATCH. PEEL THE BACK OFF THE PATCH AND PUT IT ON CLEAN,DRY, HAIR-FREE SKIN ON THE UPPER ARM, CHEST OR BACK     ondansetron (ZOFRAN) 8 MG tablet TAKE ONE TABLET BY MOUTH 30-45 MINS PRIOR TO TAKING YOUR CHEMO TO PREVENT NAUSEA 30 tablet 3   pregabalin (LYRICA) 50 MG capsule Take 1 capsule (50 mg total) by mouth 3 (three) times daily. 90 capsule 1   sodium chloride (OCEAN) 0.65 % nasal spray SPRAY 2 SPRAYS INTO EACH NOSTRIL FOUR TIMES A DAY FOR SINUS CONGESTION     asciminib hcl (SCEMBLIX) 40 MG tablet Take 2 tablets (80 mg total) by mouth daily. Take on empty stomach, at least one hour  before or two hours after food. 60 tablet 2   prochlorperazine (COMPAZINE) 10 MG tablet TAKE 1 TABLET BY MOUTH 30-45 MINUTES PRIOR TO TAKING CANCER MEDICATION 60 tablet 3   No current facility-administered medications for this visit.    PHYSICAL EXAMINATION: ECOG PERFORMANCE STATUS: 0 - Asymptomatic  Vitals:   03/08/24 1457  BP: (!) 143/79  Pulse: 68  Resp: 18  Temp: 98.5 F (36.9 C)  SpO2: 100%       Filed Weights   03/08/24 1457  Weight: 173 lb 6.4 oz (78.7 kg)        Physical Exam Constitutional:      Comments: Patient ambulating independently.  He is accompanied by wife/significant other.  HENT:     Head: Normocephalic and atraumatic.     Mouth/Throat:     Pharynx: No oropharyngeal exudate.  Eyes:     Pupils: Pupils are equal, round, and reactive to light.  Cardiovascular:     Rate and Rhythm: Normal rate and regular rhythm.  Pulmonary:     Effort: No respiratory distress.     Breath sounds: No wheezing.     Comments: Decreased air entry bilaterally. Abdominal:     General: Bowel sounds are normal. There is no distension.     Palpations: Abdomen is soft. There is no mass.     Tenderness: There is no abdominal tenderness. There is no guarding or rebound.  Musculoskeletal:        General: No tenderness. Normal range of motion.     Cervical back: Normal range of motion and neck supple.  Skin:    General: Skin is warm.  Neurological:     Mental Status: He is alert and oriented to person, place, and time.  Psychiatric:        Mood and Affect: Affect normal.    LABORATORY DATA:  I have reviewed the data as listed Lab Results  Component Value Date   WBC 6.5 02/23/2024   HGB 15.5 02/23/2024   HCT 46.1 02/23/2024   MCV 87.3 02/23/2024   PLT 182 02/23/2024   Recent Labs    06/20/23 0942 06/21/23 0730 09/01/23 1021 11/24/23 1034  02/23/24 1003  NA 135   < > 132* 137 132*  K 3.6   < > 4.1 4.3 4.7  CL 106   < > 104 104 101  CO2 21*   < > 22 23 24    GLUCOSE 235*   < > 277* 216* 326*  BUN 17   < > 14 15 13   CREATININE 0.94   < > 1.06 1.16 1.15  CALCIUM 8.9   < > 8.5* 9.0 9.0  GFRNONAA >60   < > >60 >60 >60  PROT 7.6   < > 7.4 7.4 7.3  ALBUMIN 3.9   < > 3.7 3.8 3.9  AST 21   < > 20 18 20   ALT 21   < > 12 16 21   ALKPHOS 62   < > 70 69 81  BILITOT 0.4   < > 0.5 0.6 0.7  BILIDIR <0.1  --   --   --   --   IBILI NOT CALCULATED  --   --   --   --    < > = values in this interval not displayed.    RADIOGRAPHIC STUDIES: I have personally reviewed the radiological images as listed and agreed with the findings in the report. No results found.  ASSESSMENT & PLAN:   Chronic myeloid leukemia (HCC) #Chronic myeloid leukemia- currently on asciminib 40 mg BID [since Aug 2024; dasatinib- 70 mg- pleural effusion]-     DEC 30th, 2024- NEGATIVE for the BCR-ABL1 e1a2 (p190), e13a2 (b2a2, p210) and e14a2 (b3a2, p210) fusion transcripts  FISH bcr-abl- negative. Continue Asciminib. STABLE  # Intermittent Nausea- sec to Dasatinib vs others [gastroparesis]-  zofran not helping; continue compazine. Continue evaluation with GI -[facility] evaluation- stable.   # intermittent chest pains: ? Etiology [Hx of pleural effusion]; check CXR- recommend evaluation with Cardiology; defer to PCP. [Wife sees Dr.Callwood]  #Mild anemia/CKD--hemoglobin 12.5 - saturation 10%; continued dietary [liver]- stable.   #Reflux-ron PPIs-  stable.  #Diabetes-poorly controlled; currently on insulin pump- BG- 328 [[facility]]-stable.   # CKD- stage III- GFR 57- stable.   Monday- pref/wife off work  # DISPOSITION:  # CXR -DRI today # follow up in 3 months -MD; PRIOR 2 weeks prior-cbc/cmp; mag; BCL-ABL PCR; - Dr.B     All questions were answered. The patient knows to call the clinic with any problems, questions or concerns.     Gwyn Leos, MD 03/08/2024 3:52 PM

## 2024-03-08 NOTE — Assessment & Plan Note (Addendum)
#  Chronic myeloid leukemia- currently on asciminib 40 mg BID [since Aug 2024; dasatinib- 70 mg- pleural effusion]-     DEC 30th, 2024- NEGATIVE for the BCR-ABL1 e1a2 (p190), e13a2 (b2a2, p210) and e14a2 (b3a2, p210) fusion transcripts  FISH bcr-abl- negative. Continue Asciminib. STABLE  # Intermittent Nausea- sec to Dasatinib vs others [gastroparesis]-  zofran not helping; continue compazine. Continue evaluation with GI -[facility] evaluation- stable.   # intermittent chest pains: ? Etiology [Hx of pleural effusion]; check CXR- recommend evaluation with Cardiology; defer to PCP. [Wife sees Dr.Callwood]  #Mild anemia/CKD--hemoglobin 12.5 - saturation 10%; continued dietary [liver]- stable.   #Reflux-ron PPIs-  stable.  #Diabetes-poorly controlled; currently on insulin pump- BG- 328 [[facility]]-stable.   # CKD- stage III- GFR 57- stable.   Monday- pref/wife off work  # DISPOSITION:  # CXR -DRI today # follow up in 3 months -MD; PRIOR 2 weeks prior-cbc/cmp; mag; BCL-ABL PCR; - Dr.B

## 2024-03-08 NOTE — Progress Notes (Signed)
 C/o chest pains for the past few weeks. Coming and going.  Refill compazine, pended.

## 2024-03-09 ENCOUNTER — Telehealth: Payer: Self-pay | Admitting: *Deleted

## 2024-03-09 NOTE — Telephone Encounter (Signed)
 Dr. Valentine Gasmen says chest x-ray is not concerning for any fluid buildup.  However does not explain his chest pains.  Recommend he reaches out to PCP, or follow-up with cardiology. . Pt will start with PCP

## 2024-03-09 NOTE — Progress Notes (Signed)
 Pt.notified

## 2024-06-07 ENCOUNTER — Inpatient Hospital Stay: Attending: Internal Medicine

## 2024-06-07 DIAGNOSIS — Z794 Long term (current) use of insulin: Secondary | ICD-10-CM | POA: Diagnosis not present

## 2024-06-07 DIAGNOSIS — E1122 Type 2 diabetes mellitus with diabetic chronic kidney disease: Secondary | ICD-10-CM | POA: Diagnosis not present

## 2024-06-07 DIAGNOSIS — C9210 Chronic myeloid leukemia, BCR/ABL-positive, not having achieved remission: Secondary | ICD-10-CM | POA: Diagnosis present

## 2024-06-07 DIAGNOSIS — N183 Chronic kidney disease, stage 3 unspecified: Secondary | ICD-10-CM | POA: Insufficient documentation

## 2024-06-07 DIAGNOSIS — K219 Gastro-esophageal reflux disease without esophagitis: Secondary | ICD-10-CM | POA: Insufficient documentation

## 2024-06-07 DIAGNOSIS — I129 Hypertensive chronic kidney disease with stage 1 through stage 4 chronic kidney disease, or unspecified chronic kidney disease: Secondary | ICD-10-CM | POA: Diagnosis not present

## 2024-06-07 LAB — CBC WITH DIFFERENTIAL (CANCER CENTER ONLY)
Abs Immature Granulocytes: 0.01 K/uL (ref 0.00–0.07)
Basophils Absolute: 0.1 K/uL (ref 0.0–0.1)
Basophils Relative: 1 %
Eosinophils Absolute: 0.2 K/uL (ref 0.0–0.5)
Eosinophils Relative: 4 %
HCT: 43.2 % (ref 39.0–52.0)
Hemoglobin: 14.6 g/dL (ref 13.0–17.0)
Immature Granulocytes: 0 %
Lymphocytes Relative: 22 %
Lymphs Abs: 1.4 K/uL (ref 0.7–4.0)
MCH: 29.8 pg (ref 26.0–34.0)
MCHC: 33.8 g/dL (ref 30.0–36.0)
MCV: 88.2 fL (ref 80.0–100.0)
Monocytes Absolute: 0.6 K/uL (ref 0.1–1.0)
Monocytes Relative: 9 %
Neutro Abs: 3.8 K/uL (ref 1.7–7.7)
Neutrophils Relative %: 64 %
Platelet Count: 164 K/uL (ref 150–400)
RBC: 4.9 MIL/uL (ref 4.22–5.81)
RDW: 13.6 % (ref 11.5–15.5)
Smear Review: NORMAL
WBC Count: 6 K/uL (ref 4.0–10.5)
nRBC: 0 % (ref 0.0–0.2)

## 2024-06-07 LAB — CMP (CANCER CENTER ONLY)
ALT: 16 U/L (ref 0–44)
AST: 25 U/L (ref 15–41)
Albumin: 3.6 g/dL (ref 3.5–5.0)
Alkaline Phosphatase: 81 U/L (ref 38–126)
Anion gap: 3 — ABNORMAL LOW (ref 5–15)
BUN: 13 mg/dL (ref 8–23)
CO2: 21 mmol/L — ABNORMAL LOW (ref 22–32)
Calcium: 8.4 mg/dL — ABNORMAL LOW (ref 8.9–10.3)
Chloride: 109 mmol/L (ref 98–111)
Creatinine: 1.01 mg/dL (ref 0.61–1.24)
GFR, Estimated: >60 mL/min (ref >60)
Glucose, Bld: 233 mg/dL — ABNORMAL HIGH (ref 70–99)
Potassium: 3.6 mmol/L (ref 3.5–5.1)
Sodium: 133 mmol/L — ABNORMAL LOW (ref 135–145)
Total Bilirubin: 0.6 mg/dL (ref 0.0–1.2)
Total Protein: 7 g/dL (ref 6.5–8.1)

## 2024-06-07 LAB — MAGNESIUM: Magnesium: 1.9 mg/dL (ref 1.7–2.4)

## 2024-06-13 LAB — BCR-ABL1, CML/ALL, PCR, QUANT
E1A2 Transcript: <0.0032 %
Interpretation (BCRAL):: NEGATIVE
b2a2 transcript: <0.0032 %
b3a2 transcript: <0.0032 %

## 2024-06-16 ENCOUNTER — Other Ambulatory Visit: Payer: Self-pay

## 2024-06-16 ENCOUNTER — Telehealth: Payer: Self-pay | Admitting: *Deleted

## 2024-06-16 DIAGNOSIS — C9210 Chronic myeloid leukemia, BCR/ABL-positive, not having achieved remission: Secondary | ICD-10-CM

## 2024-06-16 MED ORDER — ASCIMINIB HCL 40 MG PO TABS: 80.0000 mg | ORAL_TABLET | Freq: Every day | ORAL | 2 refills | Status: DC

## 2024-06-16 NOTE — Telephone Encounter (Signed)
 Placed for Dr. B to sign off on.

## 2024-06-16 NOTE — Telephone Encounter (Signed)
 The patient needs a refill of asciminib hclHCl 40 mg and has to take 2 a day

## 2024-06-21 ENCOUNTER — Encounter: Payer: Self-pay | Admitting: Internal Medicine

## 2024-06-21 ENCOUNTER — Inpatient Hospital Stay (HOSPITAL_BASED_OUTPATIENT_CLINIC_OR_DEPARTMENT_OTHER): Admitting: Internal Medicine

## 2024-06-21 VITALS — BP 132/62 | HR 70 | Temp 96.8°F | Resp 17 | Wt 168.0 lb

## 2024-06-21 DIAGNOSIS — C9210 Chronic myeloid leukemia, BCR/ABL-positive, not having achieved remission: Secondary | ICD-10-CM

## 2024-06-21 NOTE — Addendum Note (Signed)
 Addended by: LAEL BROWNING A on: 06/21/2024 12:56 PM   Modules accepted: Orders

## 2024-06-21 NOTE — Progress Notes (Signed)
 [facility] CONSULT NOTE  Patient Care Team: System, Provider Not In as PCP - General Rennie Cindy SAUNDERS, MD as Consulting Physician (Oncology)  CHIEF COMPLAINTS/PURPOSE OF CONSULTATION: CML  #  Oncology History Overview Note  Result Comment:   Comment: (NOTE)  ABNORMAL: 89% OF NUCLEI POSITIVE FOR BCR/ABL1 GENE FUSION  SIGNAL(S)     # MARCH 2022--  Hypercellular bone marrow with involvement by chronic myeloid  leukemia, BCR-ABL1-positive . he bone marrow is hypercellular for age with myeloid and megakaryocytic  hyperplasia.  There is no increase in blasts by manual differential count of aspirate smears (<1%) or by CD34 immunohistochemistry on the core biopsy.  A reticulin special stain reveals no significant increase  in reticulin fibrosis (MF grade 0 of 3).  # April 1st week -DASATINIB  100 mg; July 2022-FISH BCR ABL-negative for 9:22 abnormality [optimal; reached 32-month milestone. MMR at 10 months [gaol at 12];   # FEB 2024-secondary to chronic nausea-decrease dose of dasatinib  to 70 mg/day.   # Discontinue dasatinib  because of bilateral pleural effusions; currently on asciminib 40 mg BID [since Aug 2024]-    #hypertension /alcoholic liver/ type 2 diabetes on insulin  poorly controlled chronic back pain    Chronic myeloid leukemia (HCC)  01/31/2021 Initial Diagnosis   Chronic myeloid leukemia (HCC)    HISTORY OF PRESENTING ILLNESS: Ambulating independently.  Accompanied by his wife.  Frank [name] 63 y.o.  male with-mild end of the problems-CKD; insulin -dependent diabetes; chronic phase CML most recently on Asciminib is here for follow-up.  Weight is down 5 lbs sec to poorly controled ambience temperatures. Denies nausea. Bowels are normal.  Complains of chronic neuropathy in both legs.  Not any worse.  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  Review of Systems  Constitutional:  Positive  for malaise/fatigue. Negative for chills, diaphoresis, fever and weight loss.  HENT:  Negative for nosebleeds and sore throat.   Eyes:  Negative for double vision.  Respiratory:  Negative for cough, hemoptysis, sputum production, shortness of breath and wheezing.   Cardiovascular:  Negative for chest pain, palpitations, orthopnea and leg swelling.  Gastrointestinal:  Negative for abdominal pain, blood in stool, constipation, diarrhea, heartburn, melena, nausea and vomiting.  Genitourinary:  Negative for dysuria, frequency and urgency.  Musculoskeletal:  Positive for back pain and joint pain.  Skin: Negative.  Negative for itching and rash.  Neurological:  Negative for dizziness, tingling, focal weakness, weakness and headaches.  Endo/Heme/Allergies:  Does not bruise/bleed easily.  Psychiatric/Behavioral:  Negative for depression. The patient is not nervous/anxious and does not have insomnia.      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 No pertinent surgical history.  SOCIAL HISTORY: Social History   Socioeconomic History   Marital status: Single    Spouse name: Not on file   Number of children: Not on file   Years of education: Not on file   Highest education level: Not on file  Occupational History   Occupation:  retired     Comment: custodian   Tobacco Use   Smoking status: Some Days    Types: Cigarettes   Smokeless tobacco: Never   Tobacco comments:    4 cigarettes yesterday   Vaping Use   Vaping status: Never Used  Substance and Sexual Activity   Alcohol use: Yes    Comment: hx of ETOH abuse    Drug use: Yes    Types: Cocaine, Marijuana    Comment: had cocaine Sat. (2 days ago)   Sexual activity: Not on file  Other Topics Concern   Not on file  Social History Narrative   Lives with S.O. Montie Ill    Social Drivers of Health   Financial Resource Strain: Not on file  Food Insecurity: No Food Insecurity (06/20/2023)   Hunger Vital Sign    Worried About Running Out of Food in the Last Year: Never true    Ran Out of Food in the Last Year: Never true  Transportation Needs: No Transportation Needs (06/20/2023)   PRAPARE - Administrator, Civil Service (Medical): No    Lack of Transportation (Non-Medical): No  Physical Activity: Not on file  Stress: Not on file  Social Connections: Not on file  Intimate Partner Violence: Not At Risk (06/20/2023)   Humiliation, Afraid, Rape, and Kick questionnaire    Fear of Current or Ex-Partner: No    Emotionally Abused: No    Physically Abused: No    Sexually Abused: No    FAMILY HISTORY: Family History  Problem Relation Age of Onset   Diabetes Mother    Hypertension Mother    Heart failure Mother    Hyperlipidemia Mother    Thyroid disease Mother    Alcohol abuse Father    Emphysema Father    Kidney failure Father    Gout Father    Cancer Sister    Hyperlipidemia Sister    Hypertension Sister    Rheum arthritis Sister    Fibromyalgia Sister    Thyroid disease Sister     ALLERGIES:  has no known allergies.  MEDICATIONS:  Current Outpatient Medications  Medication Sig Dispense Refill   amLODipine  (NORVASC ) 10 MG tablet Take 0.5 tablets by mouth daily.     asciminib hcl (SCEMBLIX) 40 MG tablet Take 2 tablets (80 mg total) by mouth daily. Take on empty stomach, at least one hour before or two hours after food. 60 tablet 2   aspirin 81 MG chewable tablet Chew 81 mg by mouth daily.     Azelastine HCl 137 MCG/SPRAY SOLN Place 1 spray into the nose 2 (two) times daily as needed.     dorzolamide -timolol  (COSOPT )  2-0.5 % ophthalmic solution Place 1 drop into both eyes 2 (two) times daily.     ergocalciferol  (VITAMIN D2) 1.25 MG (50000 UT) capsule Take 1 capsule (50,000 Units total) by mouth once a week. 12 capsule 3   fluticasone  (FLONASE ) 50 MCG/ACT nasal spray INSTILL 1 SPRAY INTO EACH NOSTRIL EVERY DAY AS NEEDED MAXIMUM 2 SPRAYS IN EACH NOSTRIL DAILY. FOR ALLERGIES     insulin  aspart (NOVOLOG ) 100 UNIT/ML injection Inject 100 Units into the skin continuous. INJECT 100 UNITS UNDER SKIN CONTINUOUS-VIA-PUMP AS DIRECTED FOR DIABETES.DISCARD BOTTLE 28 DAYS AFTER OPENING     insulin  glargine (LANTUS ) 100 UNIT/ML injection 12 Units at bedtime.     latanoprost  (XALATAN ) 0.005 % ophthalmic solution Place 1 drop into both eyes at bedtime.     lipase/protease/amylase (CREON) 36000 UNITS  CPEP capsule See admin instructions. TAKE 2 CAPSULES BY MOUTH WITH ACTIVE MEALS AND TAKE 1 CAPSULE WITH SNACK     lisinopril -hydrochlorothiazide  (ZESTORETIC ) 20-12.5 MG tablet Take 1 tablet by mouth daily.     lovastatin (MEVACOR) 40 MG tablet Take 40 mg by mouth at bedtime.     methocarbamol  (ROBAXIN ) 500 MG tablet Take 1 tablet (500 mg total) by mouth every 8 (eight) hours as needed for muscle spasms. 90 tablet 1   nicotine  (NICODERM CQ  - DOSED IN MG/24 HOURS) 14 mg/24hr patch Place 14 mg onto the skin daily. APPLY ONE PATCH TO SKIN EVERY DAY ACTIVE APPLY ONE PATCH WHEN YOU WAKE UP AND REMOVE THE OLD PATCH. PEEL THE BACK OFF THE PATCH AND PUT IT ON CLEAN,DRY, HAIR-FREE SKIN ON THE UPPER ARM, CHEST OR BACK     ondansetron  (ZOFRAN ) 8 MG tablet TAKE ONE TABLET BY MOUTH 30-45 MINS PRIOR TO TAKING YOUR CHEMO TO PREVENT NAUSEA 30 tablet 3   pregabalin  (LYRICA ) 50 MG capsule Take 1 capsule (50 mg total) by mouth 3 (three) times daily. 90 capsule 1   prochlorperazine  (COMPAZINE ) 10 MG tablet TAKE 1 TABLET BY MOUTH 30-45 MINUTES PRIOR TO TAKING CANCER MEDICATION 60 tablet 3   sertraline (ZOLOFT) 50 MG tablet Take 50 mg by mouth daily.      sodium chloride  (OCEAN) 0.65 % nasal spray SPRAY 2 SPRAYS INTO EACH NOSTRIL FOUR TIMES A DAY FOR SINUS CONGESTION     cetirizine (ZYRTEC) 10 MG tablet Take 10 mg by mouth at bedtime. (Patient not taking: Reported on 06/21/2024)     No current facility-administered medications for this visit.    PHYSICAL EXAMINATION: ECOG PERFORMANCE STATUS: 0 - Asymptomatic  Vitals:   06/21/24 1052  BP: 132/62  Pulse: 70  Resp: 17  Temp: (!) 96.8 F (36 C)  SpO2: 100%       Filed Weights   06/21/24 1052  Weight: 168 lb (76.2 kg)        Physical Exam Constitutional:      Comments: Patient ambulating independently.  He is accompanied by wife/significant other.  HENT:     Head: Normocephalic and atraumatic.     Mouth/Throat:     Pharynx: No oropharyngeal exudate.  Eyes:     Pupils: Pupils are equal, round, and reactive to light.  Cardiovascular:     Rate and Rhythm: Normal rate and regular rhythm.  Pulmonary:     Effort: No respiratory distress.     Breath sounds: No wheezing.     Comments: Decreased air entry bilaterally. Abdominal:     General: Bowel sounds are normal. There is no distension.     Palpations: Abdomen is soft. There is no mass.     Tenderness: There is no abdominal tenderness. There is no guarding or rebound.  Musculoskeletal:        General: No tenderness. Normal range of motion.     Cervical back: Normal range of motion and neck supple.  Skin:    General: Skin is warm.  Neurological:     Mental Status: He is alert and oriented to person, place, and time.  Psychiatric:        Mood and Affect: Affect normal.    LABORATORY DATA:  I have reviewed the data as listed Lab Results  Component Value Date   WBC 6.0 06/07/2024   HGB 14.6 06/07/2024   HCT 43.2 06/07/2024   MCV 88.2 06/07/2024   PLT 164 06/07/2024   Recent Labs  11/24/23 1034 02/23/24 1003 06/07/24 0944  NA 137 132* 133*  K 4.3 4.7 3.6  CL 104 101 109  CO2 23 24 21*  GLUCOSE 216*  326* 233*  BUN 15 13 13   CREATININE 1.16 1.15 1.01  CALCIUM  9.0 9.0 8.4*  GFRNONAA >60 >60 >60  PROT 7.4 7.3 7.0  ALBUMIN 3.8 3.9 3.6  AST 18 20 25   ALT 16 21 16   ALKPHOS 69 81 81  BILITOT 0.6 0.7 0.6    RADIOGRAPHIC STUDIES: I have personally reviewed the radiological images as listed and agreed with the findings in the report. No results found.  ASSESSMENT & PLAN:   Chronic myeloid leukemia (HCC) #Chronic myeloid leukemia- currently on asciminib 40 mg BID [since Aug 2024; dasatinib - 70 mg- pleural effusion]-    JULY 2025-- NEGATIVE for the BCR-ABL1 e1a2 (p190), e13a2 (b2a2, p210) and e14a2 (b3a2, p210) fusion transcripts  FISH bcr-abl- negative. Continue Asciminib. STABLE. OK to HOLD the chemo pill 1 day prior to colonoscopy; and re-start the day after.   #Mild anemia/CKD--hemoglobin 12.5 - saturation 10%; continued dietary [liver]- stable.  Repeat iron levels.  #Reflux-ron PPIs-  stable.  # Diabetes-poorly controlled; currently on insulin  pump- BG- 232 [[facility]]-stable.   # CKD- stage III- GFR 57- -stable.   Monday- pref/wife off work  # DISPOSITION:  # follow up in 3 months -MD; PRIOR 2 weeks prior-cbc/cmp; mag; iron studies; ferritin- BCL-ABL PCR; - Dr.B     All questions were answered. The patient knows to call the clinic with any problems, questions or concerns.     Cindy JONELLE Joe, MD 06/21/2024 11:26 AM

## 2024-06-21 NOTE — Assessment & Plan Note (Addendum)
#  Chronic myeloid leukemia- currently on asciminib 40 mg BID [since Aug 2024; dasatinib - 70 mg- pleural effusion]-    JULY 2025-- NEGATIVE for the BCR-ABL1 e1a2 (p190), e13a2 (b2a2, p210) and e14a2 (b3a2, p210) fusion transcripts  FISH bcr-abl- negative. Continue Asciminib. STABLE. OK to HOLD the chemo pill 1 day prior to colonoscopy; and re-start the day after.   #Mild anemia/CKD--hemoglobin 12.5 - saturation 10%; continued dietary [liver]- stable.  Repeat iron levels.  #Reflux-ron PPIs-  stable.  # Diabetes-poorly controlled; currently on insulin  pump- BG- 232 [[facility]]-stable.   # CKD- stage III- GFR 57- -stable.   Monday- pref/wife off work  # DISPOSITION:  # follow up in 3 months -MD; PRIOR 2 weeks prior-cbc/cmp; mag; iron studies; ferritin- BCL-ABL PCR; - Dr.B

## 2024-06-21 NOTE — Progress Notes (Signed)
 Weight is down 5 lbs. Too hot to eat. Denies nausea. Bowels are normal. Neuropathy in both legs. Back is aching since [place] [employer] [place]. Having colonoscopy on the 12th of August. When should he start taking chemo pill ?

## 2024-06-22 ENCOUNTER — Other Ambulatory Visit: Payer: Self-pay | Admitting: Internal Medicine

## 2024-09-06 ENCOUNTER — Inpatient Hospital Stay: Attending: Internal Medicine

## 2024-09-06 DIAGNOSIS — C9210 Chronic myeloid leukemia, BCR/ABL-positive, not having achieved remission: Secondary | ICD-10-CM | POA: Diagnosis present

## 2024-09-06 LAB — CBC WITH DIFFERENTIAL (CANCER CENTER ONLY)
Abs Immature Granulocytes: 0.04 K/uL (ref 0.00–0.07)
Basophils Absolute: 0.1 K/uL (ref 0.0–0.1)
Basophils Relative: 1 %
Eosinophils Absolute: 0.3 K/uL (ref 0.0–0.5)
Eosinophils Relative: 4 %
HCT: 39.8 % (ref 39.0–52.0)
Hemoglobin: 13.5 g/dL (ref 13.0–17.0)
Immature Granulocytes: 1 %
Lymphocytes Relative: 19 %
Lymphs Abs: 1.2 K/uL (ref 0.7–4.0)
MCH: 29.4 pg (ref 26.0–34.0)
MCHC: 33.9 g/dL (ref 30.0–36.0)
MCV: 86.7 fL (ref 80.0–100.0)
Monocytes Absolute: 0.5 K/uL (ref 0.1–1.0)
Monocytes Relative: 7 %
Neutro Abs: 4.5 K/uL (ref 1.7–7.7)
Neutrophils Relative %: 68 %
Platelet Count: 155 K/uL (ref 150–400)
RBC: 4.59 MIL/uL (ref 4.22–5.81)
RDW: 14.6 % (ref 11.5–15.5)
Smear Review: NORMAL
WBC Count: 6.6 K/uL (ref 4.0–10.5)
nRBC: 0 % (ref 0.0–0.2)

## 2024-09-06 LAB — CMP (CANCER CENTER ONLY)
ALT: 21 U/L (ref 0–44)
AST: 27 U/L (ref 15–41)
Albumin: 3.5 g/dL (ref 3.5–5.0)
Alkaline Phosphatase: 84 U/L (ref 38–126)
Anion gap: 7 (ref 5–15)
BUN: 18 mg/dL (ref 8–23)
CO2: 21 mmol/L — ABNORMAL LOW (ref 22–32)
Calcium: 8.6 mg/dL — ABNORMAL LOW (ref 8.9–10.3)
Chloride: 103 mmol/L (ref 98–111)
Creatinine: 1.21 mg/dL (ref 0.61–1.24)
GFR, Estimated: >60 mL/min (ref >60)
Glucose, Bld: 379 mg/dL — ABNORMAL HIGH (ref 70–99)
Potassium: 3.5 mmol/L (ref 3.5–5.1)
Sodium: 131 mmol/L — ABNORMAL LOW (ref 135–145)
Total Bilirubin: 0.6 mg/dL (ref 0.0–1.2)
Total Protein: 6.8 g/dL (ref 6.5–8.1)

## 2024-09-06 LAB — IRON AND TIBC
Iron: 72 ug/dL (ref 45–182)
Saturation Ratios: 26 % (ref 17.9–39.5)
TIBC: 281 ug/dL (ref 250–450)
UIBC: 209 ug/dL

## 2024-09-06 LAB — MAGNESIUM: Magnesium: 1.9 mg/dL (ref 1.7–2.4)

## 2024-09-06 LAB — FERRITIN: Ferritin: 32 ng/mL (ref 24–336)

## 2024-09-09 LAB — BCR-ABL1, CML/ALL, PCR, QUANT
E1A2 Transcript: <0.0032 %
Interpretation (BCRAL):: NEGATIVE
b2a2 transcript: <0.0032 %
b3a2 transcript: <0.0032 %

## 2024-09-15 ENCOUNTER — Other Ambulatory Visit: Payer: Self-pay | Admitting: Internal Medicine

## 2024-09-20 ENCOUNTER — Ambulatory Visit: Admitting: Internal Medicine

## 2024-09-21 ENCOUNTER — Other Ambulatory Visit: Payer: Self-pay

## 2024-09-21 DIAGNOSIS — C9210 Chronic myeloid leukemia, BCR/ABL-positive, not having achieved remission: Secondary | ICD-10-CM

## 2024-09-21 MED ORDER — ASCIMINIB HCL 40 MG PO TABS: 80.0000 mg | ORAL_TABLET | Freq: Every day | ORAL | 2 refills | Status: AC

## 2024-10-04 ENCOUNTER — Encounter: Payer: Self-pay | Admitting: Internal Medicine

## 2024-10-04 ENCOUNTER — Inpatient Hospital Stay: Attending: Internal Medicine | Admitting: Internal Medicine

## 2024-10-04 VITALS — BP 130/75 | HR 65 | Temp 98.6°F | Resp 20 | Wt 169.8 lb

## 2024-10-04 DIAGNOSIS — Z7982 Long term (current) use of aspirin: Secondary | ICD-10-CM | POA: Insufficient documentation

## 2024-10-04 DIAGNOSIS — Z794 Long term (current) use of insulin: Secondary | ICD-10-CM | POA: Diagnosis not present

## 2024-10-04 DIAGNOSIS — C9210 Chronic myeloid leukemia, BCR/ABL-positive, not having achieved remission: Secondary | ICD-10-CM | POA: Diagnosis not present

## 2024-10-04 DIAGNOSIS — E1122 Type 2 diabetes mellitus with diabetic chronic kidney disease: Secondary | ICD-10-CM | POA: Diagnosis not present

## 2024-10-04 DIAGNOSIS — I129 Hypertensive chronic kidney disease with stage 1 through stage 4 chronic kidney disease, or unspecified chronic kidney disease: Secondary | ICD-10-CM | POA: Diagnosis not present

## 2024-10-04 DIAGNOSIS — F17210 Nicotine dependence, cigarettes, uncomplicated: Secondary | ICD-10-CM | POA: Insufficient documentation

## 2024-10-04 DIAGNOSIS — N183 Chronic kidney disease, stage 3 unspecified: Secondary | ICD-10-CM | POA: Insufficient documentation

## 2024-10-04 DIAGNOSIS — Z79899 Other long term (current) drug therapy: Secondary | ICD-10-CM | POA: Diagnosis not present

## 2024-10-04 NOTE — Assessment & Plan Note (Signed)
#  Chronic myeloid leukemia- currently on asciminib 40 mg BID [since Aug 2024; dasatinib - 70 mg- pleural effusion]-   OCT 2025-- NEGATIVE  for the BCR-ABL1 e1a2 (p190), e13a2 (b2a2, p210) and e14a2 (b3a2, p210) fusion transcripts  FISH bcr-abl- negative. Continue Asciminib. Stable.  #Mild anemia/CKD--hemoglobin 12.5 - saturation 10%; continued dietary [liver]- stable.  Repeat iron levels.  #Reflux-ron PPIs-  stable.  # Diabetes-poorly controlled; currently on insulin  pump- BG- 329 [[facility]]-stable.   # CKD- stage III- GFR 57- -stable.   Monday- pref/wife off work  # DISPOSITION:  # follow up in 3 months -MD; PRIOR 2 weeks prior-cbc/cmp; mag; iron studies; ferritin- BCL-ABL PCR; - Dr.B

## 2024-10-04 NOTE — Progress Notes (Signed)
 Patient has no concerns

## 2024-10-04 NOTE — Progress Notes (Signed)
 [facility] CONSULT NOTE  Patient Care Team: System, Provider Not In as PCP - General Rennie Cindy SAUNDERS, MD as Consulting Physician (Oncology)  CHIEF COMPLAINTS/PURPOSE OF CONSULTATION: CML  #  Oncology History Overview Note  Result Comment:   Comment: (NOTE)  ABNORMAL: 89% OF NUCLEI POSITIVE FOR BCR/ABL1 GENE FUSION  SIGNAL(S)     # MARCH 2022--  Hypercellular bone marrow with involvement by chronic myeloid  leukemia, BCR-ABL1-positive . he bone marrow is hypercellular for age with myeloid and megakaryocytic  hyperplasia.  There is no increase in blasts by manual differential count of aspirate smears (<1%) or by CD34 immunohistochemistry on the core biopsy.  A reticulin special stain reveals no significant increase  in reticulin fibrosis (MF grade 0 of 3).  # April 1st week -DASATINIB  100 mg; July 2022-FISH BCR ABL-negative for 9:22 abnormality [optimal; reached 63-month milestone. MMR at 10 months [gaol at 12];   # FEB 2024-secondary to chronic nausea-decrease dose of dasatinib  to 70 mg/day.   # Discontinue dasatinib  because of bilateral pleural effusions; currently on asciminib 40 mg BID [since Aug 2024]-    #hypertension /alcoholic liver/ type 2 diabetes on insulin  poorly controlled chronic back pain    Chronic myeloid leukemia (HCC)  01/31/2021 Initial Diagnosis   Chronic myeloid leukemia (HCC)    HISTORY OF PRESENTING ILLNESS: Ambulating independently.  Accompanied by his wife.  Frank [name] Carte 63 y.o.  male with-mild end of the problems-CKD; insulin -dependent diabetes; chronic phase CML most recently on Asciminib is here for follow-up.  Discussed the use of AI scribe software for clinical note transcription with the patient, who gave verbal consent to proceed.  History of Present Illness   Frank [name] is a 63 year old male with chronic myeloid leukemia who presents for a follow-up visit.  He is pleased with the control of his chronic myeloid  leukemia, noting that all recent tests have been negative. He mentions that he has no refills on his chemotherapy medication but was informed that it was refilled on October 28, and he has two refills remaining.  He is concerned about his elevated blood sugar levels, with recent readings of 379 mg/dL and 677 mg/dL. He attributes the high readings to consuming coffee with sugar before giving blood.  His weight has increased slightly to 169 pounds, which he attributes to possibly wearing heavy shoes or a jacket during the weigh-in.  He confirms having undergone a colonoscopy, which he reports went well, although he felt weak afterward. He also completed a breathing test, which he was told he did well on, and has not received any follow-up calls regarding the results.      Review of Systems  Constitutional:  Positive for malaise/fatigue. Negative for chills, diaphoresis, fever and weight loss.  HENT:  Negative for nosebleeds and sore throat.   Eyes:  Negative for double vision.  Respiratory:  Negative for cough, hemoptysis, sputum production, shortness of breath and wheezing.   Cardiovascular:  Negative for chest pain, palpitations, orthopnea and leg swelling.  Gastrointestinal:  Negative for abdominal pain, blood in stool, constipation, diarrhea, heartburn, melena, nausea and vomiting.  Genitourinary:  Negative for dysuria, frequency and urgency.  Musculoskeletal:  Positive for back pain and joint pain.  Skin: Negative.  Negative for itching and rash.  Neurological:  Negative for dizziness, tingling, focal weakness, weakness and headaches.  Endo/Heme/Allergies:  Does not bruise/bleed easily.  Psychiatric/Behavioral:  Negative for depression. The patient is not nervous/anxious and does not have  insomnia.      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 No pertinent surgical history.  SOCIAL HISTORY: Social History   Socioeconomic History   Marital status: Single    Spouse name: Not on file   Number of children: Not on file   Years of education: Not on file   Highest education level: Not on file  Occupational History   Occupation: retired     Comment: custodian   Tobacco Use   Smoking status: Some Days    Types: Cigarettes   Smokeless tobacco: Never   Tobacco comments:    4 cigarettes yesterday   Vaping Use   Vaping status: Never Used  Substance and Sexual Activity   Alcohol use: Yes    Comment: hx of ETOH abuse   Drug use: Yes    Types: Cocaine, Marijuana    Comment: had cocaine Sat. (2 days ago)   Sexual activity: Not on file  Other Topics Concern   Not on file  Social History Narrative   Lives with S.O. Frank [name]    Social Drivers of Health   Financial Resource Strain: Not on file  Food Insecurity: No Food Insecurity (06/20/2023)   Hunger Vital Sign    Worried About Running Out of Food in the Last Year: Never true    Ran Out of Food in the Last Year: Never true  Transportation Needs: No Transportation Needs (06/20/2023)   PRAPARE - Administrator, Civil Service (Medical): No    Lack of Transportation (Non-Medical): No  Physical Activity: Not on file  Stress: Not on file  Social Connections: Not on file  Intimate Partner Violence: Not At Risk (06/20/2023)   Humiliation, Afraid, [name], and Kick questionnaire    Fear of Current or Ex-Partner: No    Emotionally Abused: No    Physically Abused: No    Sexually Abused: No    FAMILY HISTORY: Family History  Problem Relation Age of Onset   Diabetes Mother    Hypertension Mother    Heart failure Mother     Hyperlipidemia Mother    Thyroid disease Mother    Alcohol abuse Father    Emphysema Father    Kidney failure Father    Gout Father    Cancer Sister    Hyperlipidemia Sister    Hypertension Sister    Rheum arthritis Sister    Fibromyalgia Sister    Thyroid disease Sister     ALLERGIES:  has no known allergies.  MEDICATIONS:  Current Outpatient Medications  Medication Sig Dispense Refill   amLODipine  (NORVASC ) 10 MG tablet Take 0.5 tablets by mouth daily.     asciminib hcl (SCEMBLIX) 40 MG tablet Take 2 tablets (80 mg total) by mouth daily. Take on empty stomach, at least one hour before or two hours after food. 60 tablet 2   aspirin 81 MG chewable tablet Chew 81 mg by mouth daily.     Azelastine HCl 137 MCG/SPRAY SOLN Place 1 spray into the nose 2 (two) times  daily as needed.     dorzolamide -timolol  (COSOPT ) 2-0.5 % ophthalmic solution Place 1 drop into both eyes 2 (two) times daily.     fluticasone  (FLONASE ) 50 MCG/ACT nasal spray INSTILL 1 SPRAY INTO EACH NOSTRIL EVERY DAY AS NEEDED MAXIMUM 2 SPRAYS IN EACH NOSTRIL DAILY. FOR ALLERGIES     insulin  aspart (NOVOLOG ) 100 UNIT/ML injection Inject 100 Units into the skin continuous. INJECT 100 UNITS UNDER SKIN CONTINUOUS-VIA-PUMP AS DIRECTED FOR DIABETES.DISCARD BOTTLE 28 DAYS AFTER OPENING     insulin  glargine (LANTUS ) 100 UNIT/ML injection 12 Units at bedtime.     latanoprost  (XALATAN ) 0.005 % ophthalmic solution Place 1 drop into both eyes at bedtime.     lipase/protease/amylase (CREON) 36000 UNITS CPEP capsule See admin instructions. TAKE 2 CAPSULES BY MOUTH WITH ACTIVE MEALS AND TAKE 1 CAPSULE WITH SNACK     lisinopril -hydrochlorothiazide  (ZESTORETIC ) 20-12.5 MG tablet Take 1 tablet by mouth daily.     lovastatin (MEVACOR) 40 MG tablet Take 40 mg by mouth at bedtime.     methocarbamol  (ROBAXIN ) 500 MG tablet Take 1 tablet (500 mg total) by mouth every 8 (eight) hours as needed for muscle spasms. 90 tablet 1   nicotine  (NICODERM CQ   - DOSED IN MG/24 HOURS) 14 mg/24hr patch Place 14 mg onto the skin daily. APPLY ONE PATCH TO SKIN EVERY DAY ACTIVE APPLY ONE PATCH WHEN YOU WAKE UP AND REMOVE THE OLD PATCH. PEEL THE BACK OFF THE PATCH AND PUT IT ON CLEAN,DRY, HAIR-FREE SKIN ON THE UPPER ARM, CHEST OR BACK     ondansetron  (ZOFRAN ) 8 MG tablet TAKE ONE TABLET BY MOUTH 30-45 MINS PRIOR TO TAKING YOUR CHEMO TO PREVENT NAUSEA 30 tablet 3   pregabalin  (LYRICA ) 50 MG capsule Take 1 capsule (50 mg total) by mouth 3 (three) times daily. 90 capsule 1   prochlorperazine  (COMPAZINE ) 10 MG tablet TAKE 1 TABLET BY MOUTH 30 TO 45 MINUTES PRIOR TO TAKING CANCER MEDICATION 60 tablet 3   sertraline (ZOLOFT) 50 MG tablet Take 50 mg by mouth daily.     sodium chloride  (OCEAN) 0.65 % nasal spray SPRAY 2 SPRAYS INTO EACH NOSTRIL FOUR TIMES A DAY FOR SINUS CONGESTION     Vitamin D , Ergocalciferol , (DRISDOL ) 1.25 MG (50000 UNIT) CAPS capsule TAKE 1 CAPSULE BY MOUTH 1 TIME A WEEK 12 capsule 3   cetirizine (ZYRTEC) 10 MG tablet Take 10 mg by mouth at bedtime. (Patient not taking: Reported on 10/04/2024)     No current facility-administered medications for this visit.    PHYSICAL EXAMINATION: ECOG PERFORMANCE STATUS: 0 - Asymptomatic  Vitals:   10/04/24 1340  BP: 130/75  Pulse: 65  Resp: 20  Temp: 98.6 F (37 C)  SpO2: 100%       Filed Weights   10/04/24 1340  Weight: 169 lb 12.8 oz (77 kg)        Physical Exam Constitutional:      Comments: Patient ambulating independently.  He is accompanied by wife/significant other.  HENT:     Head: Normocephalic and atraumatic.     Mouth/Throat:     Pharynx: No oropharyngeal exudate.  Eyes:     Pupils: Pupils are equal, round, and reactive to light.  Cardiovascular:     Rate and Rhythm: Normal rate and regular rhythm.  Pulmonary:     Effort: No respiratory distress.     Breath sounds: No wheezing.     Comments: Decreased air entry bilaterally. Abdominal:     General: Bowel sounds  are normal. There is no distension.     Palpations: Abdomen is soft. There is no mass.     Tenderness: There is no abdominal tenderness. There is no guarding or rebound.  Musculoskeletal:        General: No tenderness. Normal range of motion.     Cervical back: Normal range of motion and neck supple.  Skin:    General: Skin is warm.  Neurological:     Mental Status: He is alert and oriented to person, place, and time.  Psychiatric:        Mood and Affect: Affect normal.    LABORATORY DATA:  I have reviewed the data as listed Lab Results  Component Value Date   WBC 6.6 09/06/2024   HGB 13.5 09/06/2024   HCT 39.8 09/06/2024   MCV 86.7 09/06/2024   PLT 155 09/06/2024   Recent Labs    02/23/24 1003 06/07/24 0944 09/06/24 1030  NA 132* 133* 131*  K 4.7 3.6 3.5  CL 101 109 103  CO2 24 21* 21*  GLUCOSE 326* 233* 379*  BUN 13 13 18   CREATININE 1.15 1.01 1.21  CALCIUM  9.0 8.4* 8.6*  GFRNONAA >60 >60 >60  PROT 7.3 7.0 6.8  ALBUMIN 3.9 3.6 3.5  AST 20 25 27   ALT 21 16 21   ALKPHOS 81 81 84  BILITOT 0.7 0.6 0.6    RADIOGRAPHIC STUDIES: I have personally reviewed the radiological images as listed and agreed with the findings in the report. No results found.  ASSESSMENT & PLAN:   Chronic myeloid leukemia (HCC) #Chronic myeloid leukemia- currently on asciminib 40 mg BID [since Aug 2024; dasatinib - 70 mg- pleural effusion]-   OCT 2025-- NEGATIVE  for the BCR-ABL1 e1a2 (p190), e13a2 (b2a2, p210) and e14a2 (b3a2, p210) fusion transcripts  FISH bcr-abl- negative. Continue Asciminib. Stable.  #Mild anemia/CKD--hemoglobin 12.5 - saturation 10%; continued dietary [liver]- stable.  Repeat iron levels.  #Reflux-ron PPIs-  stable.  # Diabetes-poorly controlled; currently on insulin  pump- BG- 329 [VA]-stable.   # CKD- stage III- GFR 57- -stable.   Monday- pref/wife off work  # DISPOSITION:  # follow up in 3 months -MD; PRIOR 2 weeks prior-cbc/cmp; mag; iron studies; ferritin-  BCL-ABL PCR; - Dr.B     All questions were answered. The patient knows to call the clinic with any problems, questions or concerns.     Cindy JONELLE Joe, MD 10/04/2024 2:15 PM

## 2024-11-30 ENCOUNTER — Other Ambulatory Visit (HOSPITAL_COMMUNITY): Payer: Self-pay

## 2024-12-20 ENCOUNTER — Inpatient Hospital Stay

## 2024-12-28 ENCOUNTER — Inpatient Hospital Stay: Attending: Internal Medicine

## 2025-01-03 ENCOUNTER — Inpatient Hospital Stay: Admitting: Internal Medicine

## 2025-01-24 ENCOUNTER — Inpatient Hospital Stay

## 2025-01-31 ENCOUNTER — Inpatient Hospital Stay: Admitting: Internal Medicine

## 2025-02-07 ENCOUNTER — Inpatient Hospital Stay: Admitting: Internal Medicine
